# Patient Record
Sex: Female | Born: 1945
Health system: Southern US, Community
[De-identification: ages and names within clinical notes are randomized; demographics above are authoritative.]

## PROBLEM LIST (undated history)

## (undated) DIAGNOSIS — T7840XA Allergy, unspecified, initial encounter: Secondary | ICD-10-CM

## (undated) DIAGNOSIS — M8580 Other specified disorders of bone density and structure, unspecified site: Secondary | ICD-10-CM

## (undated) DIAGNOSIS — H409 Unspecified glaucoma: Secondary | ICD-10-CM

## (undated) DIAGNOSIS — M129 Arthropathy, unspecified: Secondary | ICD-10-CM

## (undated) DIAGNOSIS — J309 Allergic rhinitis, unspecified: Secondary | ICD-10-CM

## (undated) DIAGNOSIS — R7303 Prediabetes: Secondary | ICD-10-CM

## (undated) DIAGNOSIS — M25569 Pain in unspecified knee: Secondary | ICD-10-CM

## (undated) DIAGNOSIS — I10 Essential (primary) hypertension: Secondary | ICD-10-CM

## (undated) DIAGNOSIS — M62838 Other muscle spasm: Secondary | ICD-10-CM

## (undated) DIAGNOSIS — Z86010 Personal history of colonic polyps: Secondary | ICD-10-CM

## (undated) DIAGNOSIS — R739 Hyperglycemia, unspecified: Secondary | ICD-10-CM

## (undated) DIAGNOSIS — H269 Unspecified cataract: Secondary | ICD-10-CM

## (undated) DIAGNOSIS — B029 Zoster without complications: Secondary | ICD-10-CM

## (undated) DIAGNOSIS — E785 Hyperlipidemia, unspecified: Secondary | ICD-10-CM

## (undated) DIAGNOSIS — M1990 Unspecified osteoarthritis, unspecified site: Secondary | ICD-10-CM

## (undated) DIAGNOSIS — R079 Chest pain, unspecified: Secondary | ICD-10-CM

## (undated) DIAGNOSIS — K219 Gastro-esophageal reflux disease without esophagitis: Secondary | ICD-10-CM

## (undated) DIAGNOSIS — M5430 Sciatica, unspecified side: Secondary | ICD-10-CM

## (undated) DIAGNOSIS — R011 Cardiac murmur, unspecified: Secondary | ICD-10-CM

## (undated) DIAGNOSIS — H60399 Other infective otitis externa, unspecified ear: Secondary | ICD-10-CM

## (undated) DIAGNOSIS — R252 Cramp and spasm: Secondary | ICD-10-CM

## (undated) DIAGNOSIS — F419 Anxiety disorder, unspecified: Secondary | ICD-10-CM

## (undated) DIAGNOSIS — R05 Cough: Secondary | ICD-10-CM

## (undated) DIAGNOSIS — K921 Melena: Secondary | ICD-10-CM

## (undated) DIAGNOSIS — I498 Other specified cardiac arrhythmias: Secondary | ICD-10-CM

## (undated) HISTORY — DX: Other muscle spasm: M62.838

## (undated) HISTORY — DX: Prediabetes: R73.03

## (undated) HISTORY — DX: Cardiac murmur, unspecified: R01.1

## (undated) HISTORY — DX: Cramp and spasm: R25.2

## (undated) HISTORY — PX: BREAST BIOPSY: SHX20

## (undated) HISTORY — DX: Unspecified glaucoma: H40.9

## (undated) HISTORY — DX: Melena: K92.1

## (undated) HISTORY — DX: Hyperlipidemia, unspecified: E78.5

## (undated) HISTORY — DX: Chest pain, unspecified: R07.9

## (undated) HISTORY — DX: Anxiety disorder, unspecified: F41.9

## (undated) HISTORY — PX: TUBAL LIGATION: SHX77

## (undated) HISTORY — DX: Personal history of colonic polyps: Z86.010

## (undated) HISTORY — DX: Other specified disorders of bone density and structure, unspecified site: M85.80

## (undated) HISTORY — DX: Gastro-esophageal reflux disease without esophagitis: K21.9

## (undated) HISTORY — PX: POLYPECTOMY: SHX149

## (undated) HISTORY — DX: Zoster without complications: B02.9

## (undated) HISTORY — DX: Allergy, unspecified, initial encounter: T78.40XA

## (undated) HISTORY — DX: Arthropathy, unspecified: M12.9

## (undated) HISTORY — PX: COLONOSCOPY: SHX174

## (undated) HISTORY — DX: Unspecified cataract: H26.9

## (undated) HISTORY — DX: Sciatica, unspecified side: M54.30

## (undated) HISTORY — DX: Allergic rhinitis, unspecified: J30.9

## (undated) HISTORY — DX: Other specified cardiac arrhythmias: I49.8

## (undated) HISTORY — PX: UPPER GASTROINTESTINAL ENDOSCOPY: SHX188

## (undated) HISTORY — PX: ESOPHAGOGASTRODUODENOSCOPY: SHX1529

## (undated) HISTORY — DX: Other infective otitis externa, unspecified ear: H60.399

## (undated) HISTORY — DX: Unspecified osteoarthritis, unspecified site: M19.90

## (undated) HISTORY — DX: Hyperglycemia, unspecified: R73.9

## (undated) HISTORY — DX: Essential (primary) hypertension: I10

## (undated) HISTORY — DX: Pain in unspecified knee: M25.569

## (undated) HISTORY — DX: Cough: R05

---

## 2007-08-29 ENCOUNTER — Encounter: Payer: Self-pay | Admitting: Internal Medicine

## 2007-08-30 ENCOUNTER — Encounter: Payer: Self-pay | Admitting: Internal Medicine

## 2009-08-23 ENCOUNTER — Ambulatory Visit: Payer: Self-pay | Admitting: Internal Medicine

## 2009-08-23 DIAGNOSIS — M129 Arthropathy, unspecified: Secondary | ICD-10-CM | POA: Insufficient documentation

## 2009-08-23 DIAGNOSIS — Z860100 Personal history of colon polyps, unspecified: Secondary | ICD-10-CM | POA: Insufficient documentation

## 2009-08-23 DIAGNOSIS — J309 Allergic rhinitis, unspecified: Secondary | ICD-10-CM

## 2009-08-23 DIAGNOSIS — E7800 Pure hypercholesterolemia, unspecified: Secondary | ICD-10-CM | POA: Insufficient documentation

## 2009-08-23 DIAGNOSIS — H409 Unspecified glaucoma: Secondary | ICD-10-CM | POA: Insufficient documentation

## 2009-08-23 DIAGNOSIS — M25569 Pain in unspecified knee: Secondary | ICD-10-CM | POA: Insufficient documentation

## 2009-08-23 DIAGNOSIS — E785 Hyperlipidemia, unspecified: Secondary | ICD-10-CM | POA: Insufficient documentation

## 2009-08-23 DIAGNOSIS — J40 Bronchitis, not specified as acute or chronic: Secondary | ICD-10-CM | POA: Insufficient documentation

## 2009-08-23 DIAGNOSIS — I10 Essential (primary) hypertension: Secondary | ICD-10-CM | POA: Insufficient documentation

## 2009-08-23 HISTORY — DX: Allergic rhinitis, unspecified: J30.9

## 2009-08-23 HISTORY — DX: Pain in unspecified knee: M25.569

## 2009-08-23 HISTORY — DX: Personal history of colon polyps, unspecified: Z86.0100

## 2009-08-23 HISTORY — DX: Personal history of colonic polyps: Z86.010

## 2009-09-23 ENCOUNTER — Telehealth: Payer: Self-pay | Admitting: Internal Medicine

## 2009-11-24 ENCOUNTER — Telehealth: Payer: Self-pay | Admitting: Internal Medicine

## 2009-12-18 ENCOUNTER — Encounter: Payer: Self-pay | Admitting: Internal Medicine

## 2009-12-19 ENCOUNTER — Encounter: Payer: Self-pay | Admitting: Internal Medicine

## 2009-12-19 DIAGNOSIS — R079 Chest pain, unspecified: Secondary | ICD-10-CM

## 2009-12-19 HISTORY — DX: Chest pain, unspecified: R07.9

## 2009-12-19 LAB — CONVERTED CEMR LAB
BUN: 16 mg/dL
Glucose, Bld: 87 mg/dL
Lymphocytes, automated: 24.8 %
MCV: 87 fL
Neutrophils Relative %: 63.6 %
Platelets: 343 10*3/uL
Potassium: 4.1 meq/L
Sodium: 141 meq/L

## 2009-12-20 ENCOUNTER — Ambulatory Visit: Payer: Self-pay | Admitting: Cardiology

## 2009-12-20 ENCOUNTER — Encounter: Payer: Self-pay | Admitting: Internal Medicine

## 2009-12-20 LAB — CONVERTED CEMR LAB
CO2: 31 meq/L
Calcium: 8.9 mg/dL
Glucose, Bld: 94 mg/dL
Potassium: 4.1 meq/L
Sodium: 142 meq/L

## 2009-12-27 ENCOUNTER — Telehealth: Payer: Self-pay | Admitting: Internal Medicine

## 2010-01-19 ENCOUNTER — Encounter: Payer: Self-pay | Admitting: Internal Medicine

## 2010-01-20 ENCOUNTER — Ambulatory Visit: Payer: Self-pay | Admitting: Internal Medicine

## 2010-01-20 DIAGNOSIS — H60399 Other infective otitis externa, unspecified ear: Secondary | ICD-10-CM

## 2010-01-20 DIAGNOSIS — M5430 Sciatica, unspecified side: Secondary | ICD-10-CM

## 2010-01-20 HISTORY — DX: Other infective otitis externa, unspecified ear: H60.399

## 2010-02-07 ENCOUNTER — Encounter: Payer: Self-pay | Admitting: Internal Medicine

## 2010-02-07 LAB — HM DIABETES EYE EXAM: HM Diabetic Eye Exam: NORMAL

## 2010-02-22 ENCOUNTER — Telehealth: Payer: Self-pay | Admitting: Internal Medicine

## 2010-04-12 NOTE — Letter (Signed)
Summary: [facility]  [facility]   Imported By: Lester [place] 01/24/2010 10:45:16  _____________________________________________________________________  External Attachment:    Type:   Image     Comment:   External Document

## 2010-04-12 NOTE — Progress Notes (Signed)
Summary: caduet  Phone Note Refill Request Message from:  Pharmacy on December 27, 2009 11:48 AM  Refills Requested: Medication #1:  CADUET 10-40 MG TABS take 1 by mouth once daily   Last Refilled: 12/23/2009 [facility] [place]  Initial call taken by: Orlan Leavens RMA,  December 27, 2009 11:48 AM    Prescriptions: CADUET 10-40 MG TABS (AMLODIPINE-ATORVASTATIN) take 1 by mouth once daily  #30 x 6   Entered by:   Orlan Leavens RMA   Authorized by:   Newt Lukes MD   Signed by:   Orlan Leavens RMA on 12/27/2009   Method used:   Electronically to        [facility]* (retail)       [address]       [place], [place]  [id number]       Ph: [phone]       Fax: [phone]   RxID:   [id number]

## 2010-04-12 NOTE — Progress Notes (Signed)
Summary: avalide  Phone Note Refill Request Message from:  Fax from Pharmacy on November 24, 2009 3:12 PM  Refills Requested: Medication #1:  AVALIDE 150-12.5 MG TABS take 1 by mouth once daily   Last Refilled: 11/23/2009  Method Requested: Electronic Initial call taken by: Orlan Leavens RMA,  November 24, 2009 3:12 PM    Prescriptions: AVALIDE 150-12.5 MG TABS (IRBESARTAN-HYDROCHLOROTHIAZIDE) take 1 by mouth once daily  #30 x 5   Entered by:   Orlan Leavens RMA   Authorized by:   Newt Lukes MD   Signed by:   Orlan Leavens RMA on 11/24/2009   Method used:   Electronically to        [facility]* (retail)       [address]       [place], [place]  [id number]       Ph: [phone]       Fax: [phone]   RxID:   [id number]

## 2010-04-12 NOTE — Letter (Signed)
Summary: Discharge Summary/ [facility]  Discharge Summary/ [facility]   Imported By: Dorise Hiss 12/31/2009 08:59:06  _____________________________________________________________________  External Attachment:    Type:   Image     Comment:   External Document

## 2010-04-12 NOTE — Letter (Signed)
[facility]   Imported By: Lester [place] 02/15/2010 09:11:58  _____________________________________________________________________  External Attachment:    Type:   Image     Comment:   External Document

## 2010-04-12 NOTE — Assessment & Plan Note (Signed)
Summary: NEW [facility] PT--PKG--#--STC   Vital Signs:  Patient profile:   65 year old female Height:      57 inches (144.78 cm) Weight:      213.12 pounds (96.87 kg) BMI:     46.29 O2 Sat:      97 % on Room air Temp:     99.0 degrees F (37.22 degrees C) oral Pulse rate:   90 / minute BP sitting:   138 / 94  (left arm) Cuff size:   large  Vitals Entered By: Orlan Leavens (August 23, 2009 8:36 AM)  O2 Flow:  Room air CC: New patient/ complaining of dry cough Is Patient Diabetic? No Pain Assessment Patient in pain? no        Primary Care Provider:  Newt Lukes MD  CC:  New patient/ complaining of dry cough.  History of Present Illness: new pt to me, here to est care - prev followed with providers in [place] -  1) HTN = reports compliance with ongoing medical treatment and no changes in medication dose or frequency. denies adverse side effects related to current therapy.   2) dyslipidemia - reports compliance with ongoing medical treatment and no changes in medication dose or frequency. denies adverse side effects related to current therapy.   3) glaucoma - dx 2010 - reports compliance with ongoing medical treatment and no changes in medication dose or frequency. denies adverse side effects related to current therapy.  no change in vision  4) left knee pain since spring 2010 - onset following twist injury - improved with alleve - no swelling - occ pain with ambulation and stairs  5) chronic allergic rhinitis and sinusitus - got rx for abx from prior MD 3 days ago but not helping symptoms - yellow mucus x 5 days with cough - sputum from nares and chest, no CP or HA or vision change - robitussin not helping - prev on singular but ?gneric or OTC med to use  Preventive Screening-Counseling & Management  Alcohol-Tobacco     Alcohol drinks/day: 0     Alcohol Counseling: not indicated; patient does not drink     Smoking Status: never     Tobacco Counseling: not indicated; no  tobacco use  Caffeine-Diet-Exercise     Caffeine Counseling: not indicated; caffeine use is not excessive or problematic     Does Patient Exercise: yes     Exercise Counseling: to improve exercise regimen     Depression Counseling: not indicated; screening negative for depression  Safety-Violence-Falls     Seat Belt Use: yes     Seat Belt Counseling: not indicated; patient wears seat belts     Helmet Counseling: not indicated; patient wears helmet when riding bicycle/motocycle     Firearms in the Home: firearms in the home     Firearm Counseling: not indicated; uses recommended firearm safety measures     Smoke Detector Counseling: n/a     Violence Counseling: not indicated; no violence risk noted     Fall Risk Counseling: not indicated; no significant falls noted  Current Medications (verified): 1)  Ambien 5 Mg Tabs (Zolpidem Tartrate) .... Take 1 At Bedtime As Needed 2)  Alprazolam 0.5 Mg Tabs (Alprazolam) .... Take 1-2 By Mouth Once Daily As Needed 3)  Caduet 10-40 Mg Tabs (Amlodipine-Atorvastatin) .... Take 1 By Mouth Once Daily 4)  Avalide 150-12.5 Mg Tabs (Irbesartan-Hydrochlorothiazide) .... Take 1 By Mouth Once Daily 5)  Valacyclovir Hcl 500 Mg Tabs (Valacyclovir Hcl) .Marland KitchenMarland KitchenMarland Kitchen  Take 1 By Mouth Once Daily 6)  Xalatan 0.005 % Soln (Latanoprost) .... Use 1 Drop in Each Eye At Bedtime 7)  Amoxicillin 875 Mg Tabs (Amoxicillin) .... Take 1 By Mouth Once Daily  Allergies (verified): No Known Drug Allergies  Past History:  Past Medical History: Allergic rhinitis Hyperlipidemia Hypertension  Past Surgical History: Tubal ligation  Family History: Family History of Alcoholism/Addiction (brother) Family History of Arthritis (parent) Family History of Colon CA 1st degree relative <60 (parent) Family History Lung cancer (brother & neice) Family History of Prostate CA 1st degree relative <50 (brother) Family History Diabetes 1st degree relative (brothet & mother) Family History  Hypertension (brother)  Social History: Never smoked, no alcohol divorced, lives alone -  retired in [place] for 29yrs but moved "home" to [place] in 2008 Smoking Status:  never Does Patient Exercise:  yes Seat Belt Use:  yes  Review of Systems       see HPI above. I have reviewed all other systems and they were negative.   Physical Exam  General:  overweight-appearing.  alert, well-developed, well-nourished, and cooperative to examination.   +dry cough with conversational effort Eyes:  vision grossly intact; pupils equal, round and reactive to light.  conjunctiva and lids normal.    Ears:  normal pinnae bilaterally, without erythema, swelling, or tenderness to palpation. TMs clear, without effusion, or cerumen impaction. Hearing grossly normal bilaterally  Mouth:  teeth and gums in good repair; mucous membranes moist, without lesions or ulcers. oropharynx clear without exudate, min erythema. +yellow PND Lungs:  normal respiratory effort, no intercostal retractions or use of accessory muscles; normal breath sounds bilaterally - no crackles and no wheezes.    Heart:  normal rate, regular rhythm, no murmur, and no rub. BLE with trace ankleedema. normal DP pulses and normal cap refill in all 4 extremities    Abdomen:  soft, non-tender, normal bowel sounds, no distention; no masses and no appreciable hepatomegaly or splenomegaly.   Genitalia:  defer to gyn Msk:  left knee: full range of motion, no joint effusion or swelling. no erythema or abnormal warmth. Stable to ligamentous testing. Nontender to palpation. Neurovascularly intact.  Neurologic:  alert & oriented X3 and cranial nerves II-XII symetrically intact.  strength normal in all extremities, sensation intact to light touch, and gait normal. speech fluent without dysarthria or aphasia; follows commands with good comprehension.  Skin:  no rashes, vesicles, ulcers, or erythema. No nodules or irregularity to palpation.  Psych:  Oriented X3,  memory intact for recent and remote, normally interactive, good eye contact, not anxious appearing, not depressed appearing, and not agitated.      Impression & Recommendations:  Problem # 1:  BRONCHITIS (ICD-490)  Her updated medication list for this problem includes:    Tessalon 200 Mg Caps (Benzonatate) .Marland Kitchen... 1 by mouth three times a day x 5 days, then every 8 hours as needed for cough    Azithromycin 250 Mg Tabs (Azithromycin) .Marland Kitchen... 2 tabs by mouth today, then 1 by mouth daily starting tomorrow  Take antibiotics and other medications as directed. Encouraged to push clear liquids, get enough rest, and take acetaminophen as needed. To be seen in 5-7 days if no improvement, sooner if worse.  Orders: Prescription Created Electronically [phone])  Problem # 2:  HYPERLIPIDEMIA (ICD-272.4) send for prior records to review control - plan check labs next OV if none here or if due for labs- no med change for now Her updated medication list for this  problem includes:    Caduet 10-40 Mg Tabs (Amlodipine-atorvastatin) .Marland Kitchen... Take 1 by mouth once daily  Problem # 3:  HYPERTENSION (ICD-401.9) again, send for priorlabs Her updated medication list for this problem includes:    Caduet 10-40 Mg Tabs (Amlodipine-atorvastatin) .Marland Kitchen... Take 1 by mouth once daily    Avalide 150-12.5 Mg Tabs (Irbesartan-hydrochlorothiazide) .Marland Kitchen... Take 1 by mouth once daily  BP today: 138/94  Problem # 4:  ALLERGIC RHINITIS (ICD-477.9)  Her updated medication list for this problem includes:    Loratadine 10 Mg Tabs (Loratadine) .Marland Kitchen... 1 by mouth once daily  Discussed use of allergy medications and environmental measures.   Problem # 5:  KNEE PAIN (ICD-719.46)  twisitng injury months ago - no swelling and exam benign cont alleve or tylenol and further eval if worse Her updated medication list for this problem includes:    Aspirin 81 Mg Tabs (Aspirin) .Marland Kitchen... 1 by mouth once daily  Discussed strengthening exercises, use  of ice or heat, and medications.   Problem # 6:  GLUCOMA (ICD-365.9)  Complete Medication List: 1)  Ambien 5 Mg Tabs (Zolpidem tartrate) .... Take 1 at bedtime as needed 2)  Alprazolam 0.5 Mg Tabs (Alprazolam) .... Take 1-2 by mouth once daily as needed 3)  Caduet 10-40 Mg Tabs (Amlodipine-atorvastatin) .... Take 1 by mouth once daily 4)  Avalide 150-12.5 Mg Tabs (Irbesartan-hydrochlorothiazide) .... Take 1 by mouth once daily 5)  Valacyclovir Hcl 500 Mg Tabs (Valacyclovir hcl) .... Take 1 by mouth once daily 6)  Xalatan 0.005 % Soln (Latanoprost) .... Use 1 drop in each eye at bedtime 7)  Aspirin 81 Mg Tabs (Aspirin) .Marland Kitchen.. 1 by mouth once daily 8)  Vitamin D3 1000 Unit Tabs (Cholecalciferol) .Marland Kitchen.. 1 by mouth once daily 9)  Vitamin B-12 1000 Mcg Tabs (Cyanocobalamin) .Marland Kitchen.. 1 by mouth once daily 10)  Multivitamins Tabs (Multiple vitamin) .Marland Kitchen.. 1 by mouth once daily 11)  Loratadine 10 Mg Tabs (Loratadine) .Marland Kitchen.. 1 by mouth once daily 12)  Tessalon 200 Mg Caps (Benzonatate) .Marland Kitchen.. 1 by mouth three times a day x 5 days, then every 8 hours as needed for cough 13)  Azithromycin 250 Mg Tabs (Azithromycin) .... 2 tabs by mouth today, then 1 by mouth daily starting tomorrow  Patient Instructions: 1)  it was good to see you today. 2)  will send for copy of medical records from dr. Wayne Sever as discussed to review 3)  chane antibiotic to Azithromycin (stop amoxicillin) and use tessalon for cough - your prescriptions have been electronically submitted to your pharmacy. Please take as directed. Contact our office if you believe you're having problems with the medication(s).  4)  use generic claritin (loratadine) as discussed for allergy and siunus symptoms - call if not working and we will send prescription for singular as discussed 5)  continue all other medications as ongoing - no changes 6)  Please schedule a follow-up appointment in 4-6 months, sooner if problems.  Prescriptions: ALPRAZOLAM 0.5 MG TABS  (ALPRAZOLAM) take 1-2 by mouth once daily as needed  #60 x 5   Entered and Authorized by:   Newt Lukes MD   Signed by:   Newt Lukes MD on 08/23/2009   Method used:   Print then Give to Patient   RxID:   [id number] TESSALON 200 MG CAPS (BENZONATATE) 1 by mouth three times a day x 5 days, then every 8 hours as needed for cough  #30 x 0   Entered and Authorized  by:   Newt Lukes MD   Signed by:   Newt Lukes MD on 08/23/2009   Method used:   Electronically to        [facility] (retail)       [address]       [place], [place]  [id number]       Ph: [phone]       Fax: [phone]   RxID:   [id number] AZITHROMYCIN 250 MG TABS (AZITHROMYCIN) 2 tabs by mouth today, then 1 by mouth daily starting tomorrow  #6 x 0   Entered and Authorized by:   Newt Lukes MD   Signed by:   Newt Lukes MD on 08/23/2009   Method used:   Electronically to        [facility] (retail)       [address]       [place], [place]  [id number]       Ph: [phone]       Fax: [phone]   RxID:   [id number]

## 2010-04-12 NOTE — Letter (Signed)
Summary: [facility]  [facility]   Imported By: Lester [place] 01/25/2010 09:51:11  _____________________________________________________________________  External Attachment:    Type:   Image     Comment:   External Document

## 2010-04-12 NOTE — Progress Notes (Signed)
Summary: referral  Phone Note Call from Patient   Caller: Patient [phone] Summary of Call: Pt is requesting referral to [facility] Initial call taken by: Margaret Pyle, CMA,  September 23, 2009 9:35 AM  Follow-up for Phone Call        ok - done Follow-up by: Newt Lukes MD,  September 23, 2009 10:13 AM  Additional Follow-up for Phone Call Additional follow up Details #1::        Pt advised via VM Additional Follow-up by: Margaret Pyle, CMA,  September 23, 2009 10:29 AM  New Problems: SPECIAL SCREENING FOR MALIGNANT NEOPLASMS VAGINA (ICD-V76.47)   New Problems: SPECIAL SCREENING FOR MALIGNANT NEOPLASMS VAGINA (ICD-V76.47)

## 2010-04-12 NOTE — Assessment & Plan Note (Signed)
Summary: FU---STC--PER PT RS TO POST HOSP--STC   Vital Signs:  Patient profile:   65 year old female Height:      57 inches (144.78 cm) Weight:      215.12 pounds (97.78 kg) O2 Sat:      97 % on Room air Temp:     98.6 degrees F (37.00 degrees C) oral Pulse rate:   93 / minute BP sitting:   100 / 72  (left arm) Cuff size:   large  Vitals Entered By: Orlan Leavens RMA (January 20, 2010 10:56 AM)  O2 Flow:  Room air CC: Hosp f/u was in [facility] Is Patient Diabetic? No Pain Assessment Patient in pain? no      Comments Pt also want refill on Ambien   Primary Care Provider:  Newt Lukes MD  CC:  Hosp f/u was in [facility].  History of Present Illness: here for hosp f/u: at [facility] 12/19/09-12/20/09 for CP - neg eval for cardiac issues including neg tele, neg CL - also cont L low back and thigh pain from prior injury (see below) - not addressed during hosp- feels tired and weak since hosp - no energy but denies depression  reviewed chronic med issues 1) HTN = reports compliance with ongoing medical treatment and no changes in medication dose or frequency. denies adverse side effects related to current therapy.   2) dyslipidemia - reports compliance with ongoing medical treatment and no changes in medication dose or frequency. denies adverse side effects related to current therapy.   3) glaucoma - dx 2010 - reports compliance with ongoing medical treatment and no changes in medication dose or frequency. denies adverse side effects related to current therapy.  no change in vision  4) left knee pain since spring 2010 - onset following twist injury - improved with alleve - no swelling - occ pain with ambulation and stairs - complicated by left thigh pain from back DDD  5) chronic allergic rhinitis and sinusitus -chronic sputum from nares and chest, no CP or HA or vision change - robitussin not helping, take loritadine regularly - prev on singular  Clinical  Review Panels:  Complete Metabolic Panel   Glucose:  87 (12/19/2009)   Sodium:  141 (12/19/2009)   Potassium:  4.1 (12/19/2009)   BUN:  16 (12/19/2009)   Creatinine:  0.86 (12/19/2009)   Current Medications (verified): 1)  Alprazolam 0.5 Mg Tabs (Alprazolam) .... Take 1-2 By Mouth Once Daily As Needed 2)  Caduet 10-40 Mg Tabs (Amlodipine-Atorvastatin) .... Take 1 By Mouth Once Daily 3)  Avalide 150-12.5 Mg Tabs (Irbesartan-Hydrochlorothiazide) .... Take 1 By Mouth Once Daily 4)  Valacyclovir Hcl 500 Mg Tabs (Valacyclovir Hcl) .... Take 1 By Mouth Once Daily 5)  Xalatan 0.005 % Soln (Latanoprost) .... Use 1 Drop in Each Eye At Bedtime 6)  Aspirin 81 Mg Tabs (Aspirin) .Marland Kitchen.. 1 By Mouth Once Daily 7)  Vitamin D3 1000 Unit Tabs (Cholecalciferol) .Marland Kitchen.. 1 By Mouth Once Daily 8)  Vitamin B-12 1000 Mcg Tabs (Cyanocobalamin) .Marland Kitchen.. 1 By Mouth Once Daily 9)  Multivitamins  Tabs (Multiple Vitamin) .Marland Kitchen.. 1 By Mouth Once Daily 10)  Loratadine 10 Mg Tabs (Loratadine) .Marland Kitchen.. 1 By Mouth Once Daily 11)  Tessalon 200 Mg Caps (Benzonatate) .Marland Kitchen.. 1 By Mouth Three Times A Day X 5 Days, Then Every 8 Hours As Needed For Cough 12)  Ambien Cr 12.5 Mg Cr-Tabs (Zolpidem Tartrate) .... Take 1 At Bedtime As Needed  Allergies (verified):  No Known Drug Allergies  Past History:  Past Medical History: Allergic rhinitis Hyperlipidemia Hypertension  Review of Systems  The patient denies fever, decreased hearing, chest pain, syncope, and headaches.         c/o itch and uncomfortable right ear x 6 days, no drainage   Physical Exam  General:  overweight-appearing.  alert, well-developed, well-nourished, and cooperative to examination.   +dry cough with conversational effort Ears:  mild R otitis external and mod red TM, L ear and TM normal Mouth:  teeth and gums in good repair; mucous membranes moist, without lesions or ulcers. oropharynx clear without exudate, min erythema. +yellow PND Lungs:  normal respiratory  effort, no intercostal retractions or use of accessory muscles; normal breath sounds bilaterally - no crackles and no wheezes.    Heart:  normal rate, regular rhythm, no murmur, and no rub. BLE with trace ankle edema.  Msk:  left knee: full range of motion, no joint effusion or swelling. no erythema or abnormal warmth. Stable to ligamentous testing. Nontender to palpation. Neurovascularly intact. back: full range of motion of lumbar spine. Nontender to palpation. Deep tendon reflexes symmetrically intact. Sensation intact throughout all dermatomes in bilateral lower extremities. Full strength to manual muscle testing in all major groups. Able to heel and toe walk without difficulty and ambulates with a normal gait.    Impression & Recommendations:  Problem # 1:  OTITIS EXTERNA, RIGHT (ICD-380.10)  Her updated medication list for this problem includes:    Cipro Hc 0.2-1 % Susp (Ciprofloxacin-hydrocortisone) .Marland KitchenMarland KitchenMarland KitchenMarland Kitchen 3 drops to affected ear two times a day x 7days  Discussed symptomatic treatment and preventive measures.   Problem # 2:  CHEST PAIN (ICD-786.50) hosp dc summary reviewed - no cardiac issue identifies -  send for copy of labs and tests results to have on file - no recurrence since dc but feels weak - see next  Problem # 3:  HYPERTENSION (ICD-401.9)  ?overtx - stop avalide and cont amlodipine component of caudet Her updated medication list for this problem includes:    Caduet 10-40 Mg Tabs (Amlodipine-atorvastatin) .Marland Kitchen... Take 1 by mouth once daily    Avalide 150-12.5 Mg Tabs (Irbesartan-hydrochlorothiazide) ..... Hold for now  BP today: 100/72 Prior BP: 138/94 (08/23/2009)  Labs Reviewed: K+: 4.1 (12/19/2009) Creat: : 0.86 (12/19/2009)     Problem # 4:  SCIATICA, LEFT (ICD-724.3)  chronic - refer to ortho spine for eval and tx Her updated medication list for this problem includes:    Aspirin 81 Mg Tabs (Aspirin) .Marland Kitchen... 1 by mouth once daily  Orders: Orthopedic Surgeon  Referral (Ortho Surgeon)  Problem # 5:  HYPERLIPIDEMIA (ICD-272.4)  Her updated medication list for this problem includes:    Caduet 10-40 Mg Tabs (Amlodipine-atorvastatin) .Marland Kitchen... Take 1 by mouth once daily  send for hosp ecords to review control -no med change for now  Complete Medication List: 1)  Alprazolam 0.5 Mg Tabs (Alprazolam) .... Take 1-2 by mouth once daily as needed 2)  Caduet 10-40 Mg Tabs (Amlodipine-atorvastatin) .... Take 1 by mouth once daily 3)  Avalide 150-12.5 Mg Tabs (Irbesartan-hydrochlorothiazide) .... Hold for now 4)  Valacyclovir Hcl 500 Mg Tabs (Valacyclovir hcl) .... Take 1 by mouth once daily 5)  Xalatan 0.005 % Soln (Latanoprost) .... Use 1 drop in each eye at bedtime 6)  Aspirin 81 Mg Tabs (Aspirin) .Marland Kitchen.. 1 by mouth once daily 7)  Vitamin D3 1000 Unit Tabs (Cholecalciferol) .Marland Kitchen.. 1 by mouth once daily 8)  Vitamin  B-12 1000 Mcg Tabs (Cyanocobalamin) .Marland Kitchen.. 1 by mouth once daily 9)  Multivitamins Tabs (Multiple vitamin) .Marland Kitchen.. 1 by mouth once daily 10)  Loratadine 10 Mg Tabs (Loratadine) .Marland Kitchen.. 1 by mouth once daily 11)  Tessalon 200 Mg Caps (Benzonatate) .Marland Kitchen.. 1 by mouth three times a day x 5 days, then every 8 hours as needed for cough 12)  Ambien Cr 12.5 Mg Cr-tabs (Zolpidem tartrate) .... Take 1 at bedtime as needed 13)  Cipro Hc 0.2-1 % Susp (Ciprofloxacin-hydrocortisone) .... 3 drops to affected ear two times a day x 7days  Other Orders: Admin 1st Vaccine (32202) Flu Vaccine 51yrs + (54270)  Patient Instructions: 1)  it was good to see you today. 2)  recent hospital stay reviewed today - will send for copy of labs and tests done at that time 3)  stop avalide until further notice 4)  we'll make referral to spine specialist for your back and left thigh pain. Our office will contact you regarding this appointment once made.  5)  ear drops for right eat symptoms - also refill on ambien  6)  your prescriptions have been given to you to submit to your pharmacy.  Please take as directed. Contact our office if you believe you're having problems with the medication(s).  7)  Please schedule a follow-up appointment in 3 months to recheck bloos pressure, call sooner if problems.  Prescriptions: AMBIEN CR 12.5 MG CR-TABS (ZOLPIDEM TARTRATE) take 1 at bedtime as needed  #30 x 1   Entered and Authorized by:   Newt Lukes MD   Signed by:   Newt Lukes MD on 01/20/2010   Method used:   Print then Give to Patient   RxID:   [id number] CIPRO HC 0.2-1 % SUSP (CIPROFLOXACIN-HYDROCORTISONE) 3 drops to affected ear two times a day x 7days  #1 x 0   Entered and Authorized by:   Newt Lukes MD   Signed by:   Newt Lukes MD on 01/20/2010   Method used:   Print then Give to Patient   RxID:   [id number]    Orders Added: 1)  Admin 1st Vaccine [90471] 2)  Flu Vaccine 30yrs + [03500] 3)  Orthopedic Surgeon Referral [Ortho Surgeon] 4)  Est. Patient Level IV [93818] Flu Vaccine Consent Questions     Do you have a history of severe allergic reactions to this vaccine? no    Any prior history of allergic reactions to egg and/or gelatin? no    Do you have a sensitivity to the preservative Thimersol? no    Do you have a past history of Guillan-Barre Syndrome? no    Do you currently have an acute febrile illness? no    Have you ever had a severe reaction to latex? no    Vaccine information given and explained to patient? yes    Are you currently pregnant? no    Lot Number:[id number]   Exp Date:09/10/2010   Site Given  Left Deltoid IMVaccine 43yrs + [id number]     .lbflu

## 2010-04-14 NOTE — Progress Notes (Signed)
Summary: alprazolam  Phone Note Refill Request Message from:  Fax from Pharmacy on February 22, 2010 4:28 PM  Refills Requested: Medication #1:  ALPRAZOLAM 0.5 MG TABS take 1-2 by mouth once daily as needed # 60   Last Refilled: 12/09/2009 [facility] [phone] Last ov 01/20/10 Is this ok to refill?  Next Appointment Scheduled: none Initial call taken by: Orlan Leavens RMA,  February 22, 2010 4:30 PM  Follow-up for Phone Call        ok to fill as prev rx'd Follow-up by: Newt Lukes MD,  February 22, 2010 4:41 PM  Additional Follow-up for Phone Call Additional follow up Details #1::        F[facility]ed paper [facility] [place] [email or web address]. Updated EMR Additional Follow-up by: Orlan Leavens RMA,  February 22, 2010 4:55 PM    Prescriptions: ALPRAZOLAM 0.5 MG TABS (ALPRAZOLAM) take 1-2 by mouth once daily as needed  #60 x 5   Entered by:   Orlan Leavens RMA   Authorized by:   Newt Lukes MD   Signed by:   Orlan Leavens RMA on 02/22/2010   Method used:   Historical   RxID:   [id number]

## 2010-04-14 NOTE — Consult Note (Signed)
Summary: [facility]   Imported By: Lester Lockbourne 03/17/2010 10:34:42  _____________________________________________________________________  External Attachment:    Type:   Image     Comment:   External Document

## 2010-08-01 ENCOUNTER — Encounter: Payer: Self-pay | Admitting: Internal Medicine

## 2010-08-12 ENCOUNTER — Other Ambulatory Visit: Payer: Self-pay | Admitting: *Deleted

## 2010-08-16 ENCOUNTER — Other Ambulatory Visit: Payer: Self-pay | Admitting: *Deleted

## 2010-08-16 MED ORDER — IRBESARTAN-HYDROCHLOROTHIAZIDE 150-12.5 MG PO TABS: 1.0000 | ORAL_TABLET | Freq: Every day | ORAL | Status: DC

## 2010-09-06 ENCOUNTER — Other Ambulatory Visit: Payer: Self-pay | Admitting: Internal Medicine

## 2010-09-07 NOTE — Telephone Encounter (Signed)
Faxed script back to [facility] in [place] @ [phone].Marland KitchenMarland Kitchen6/27/12@1 :34pm/LMB

## 2010-10-03 ENCOUNTER — Other Ambulatory Visit: Payer: Self-pay

## 2010-10-03 MED ORDER — IRBESARTAN-HYDROCHLOROTHIAZIDE 150-12.5 MG PO TABS: 1.0000 | ORAL_TABLET | Freq: Every day | ORAL | Status: DC

## 2010-10-03 MED ORDER — VALACYCLOVIR HCL 500 MG PO TABS: 500.0000 mg | ORAL_TABLET | Freq: Every day | ORAL | Status: DC

## 2010-10-03 MED ORDER — LORATADINE 10 MG PO TABS: 10.0000 mg | ORAL_TABLET | Freq: Every day | ORAL | Status: DC

## 2010-10-03 MED ORDER — ZOLPIDEM TARTRATE ER 12.5 MG PO TBCR: 12.5000 mg | EXTENDED_RELEASE_TABLET | Freq: Every evening | ORAL | Status: DC | PRN

## 2010-10-03 MED ORDER — AMLODIPINE-ATORVASTATIN 10-40 MG PO TABS: 1.0000 | ORAL_TABLET | Freq: Every day | ORAL | Status: DC

## 2010-10-03 MED ORDER — VITAMIN B-12 1000 MCG PO TABS: 1000.0000 ug | ORAL_TABLET | Freq: Every day | ORAL | Status: DC

## 2010-10-03 NOTE — Telephone Encounter (Signed)
Yes done thanks

## 2010-10-03 NOTE — Telephone Encounter (Signed)
Pt called requesting Rxs for all of her medications because she is changing pharmacies. Pt is [place] and Ambien specifically because she is going on vacation. Rxs refilled and pharmacy updated. Okay to refill Ambien?

## 2010-10-04 NOTE — Telephone Encounter (Signed)
Rx faxed to pharmacy  

## 2010-11-12 DIAGNOSIS — M5430 Sciatica, unspecified side: Secondary | ICD-10-CM

## 2010-11-12 HISTORY — DX: Sciatica, unspecified side: M54.30

## 2010-11-17 ENCOUNTER — Ambulatory Visit (INDEPENDENT_AMBULATORY_CARE_PROVIDER_SITE_OTHER)
Admission: RE | Admit: 2010-11-17 | Discharge: 2010-11-17 | Disposition: A | Discharge status: Home / Self Care | Payer: Medicare Other | Source: Ambulatory Visit | Attending: Internal Medicine | Admitting: Internal Medicine

## 2010-11-17 ENCOUNTER — Encounter: Payer: Self-pay | Admitting: Internal Medicine

## 2010-11-17 ENCOUNTER — Ambulatory Visit (INDEPENDENT_AMBULATORY_CARE_PROVIDER_SITE_OTHER): Payer: Medicare Other | Admitting: Internal Medicine

## 2010-11-17 VITALS — BP 132/82 | HR 81 | Temp 98.8°F | Ht 60.0 in | Wt 214.4 lb

## 2010-11-17 DIAGNOSIS — G5790 Unspecified mononeuropathy of unspecified lower limb: Secondary | ICD-10-CM

## 2010-11-17 DIAGNOSIS — M792 Neuralgia and neuritis, unspecified: Secondary | ICD-10-CM

## 2010-11-17 DIAGNOSIS — M549 Dorsalgia, unspecified: Secondary | ICD-10-CM

## 2010-11-17 MED ORDER — TRAMADOL HCL 50 MG PO TABS: 50.0000 mg | ORAL_TABLET | Freq: Four times a day (QID) | ORAL | Status: AC | PRN

## 2010-11-17 NOTE — Progress Notes (Signed)
  Subjective:    Patient ID: Shelley Hayes, female    DOB: 1945/12/04, 65 y.o.   MRN: [id number]  HPI complains of B thigh pain Ongoing for years but now daily pain in last 2 months Progressive worse symptoms  Pain improved with rest - worse with activity such as walking/standing Chronic low back without change - denies falls but pain and "weakness" while walking causes pt to "go to the ground" x 3 events while shopping in last 2 weeks  Also reviewed chronic medical issues  hypertension -reports compliance with ongoing medical treatment and no changes in medication dose or frequency. denies adverse side effects related to current therapy.   dyslipidemia - reports compliance with ongoing medical treatment and no changes in medication dose or frequency. denies adverse side effects related to current therapy.   glaucoma - dx 2010 - reports compliance with ongoing medical treatment and no changes in medication dose or frequency. denies adverse side effects related to current therapy. no change in vision   left knee pain since spring 2010 - onset following twist injury - improved with alleve - no swelling - occ pain with ambulation and stairs - complicated by left thigh pain from back DDD   chronic allergic rhinitis and sinusitus -chronic sputum from nares and chest, no CP or HA or vision change - takes loritadine regularly - prev on singular   Past Medical History  Diagnosis Date  . ARTHRITIS   . ALLERGIC RHINITIS   . GLUCOMA   . HYPERLIPIDEMIA   . HYPERTENSION   . SCIATICA, LEFT     Review of Systems  Constitutional: Negative for fever and unexpected weight change.  Respiratory: Negative for shortness of breath.   Cardiovascular: Negative for chest pain.  Genitourinary: Negative for difficulty urinating.  Neurological: Negative for weakness.       Objective:   Physical Exam BP 132/82  Pulse 81  Temp(Src) 98.8 F (37.1 C) (Oral)  Ht 5' (1.524 m)  Wt 214 lb 6.4 oz (97.251 kg)   BMI 41.87 kg/m2  SpO2 97% Wt Readings from Last 3 Encounters:  11/17/10 214 lb 6.4 oz (97.251 kg)  01/20/10 215 lb 1.9 oz (97.577 kg)  08/23/09 213 lb 1.9 oz (96.67 kg)   Constitutional: She is overweight. She appears well-developed and well-nourished. No distress Cardiovascular: Normal rate, regular rhythm and normal heart sounds.  No murmur heard. No BLE edema. Pulmonary/Chest: Effort normal and breath sounds normal. No respiratory distress. She has no wheezes.  Musculoskeletal: Back: full range of motion of thoracic and lumbar spine. Non tender to palpation. Positive straight leg raise. DTR's are symmetrically intact. Sensation intact in all dermatomes of the lower extremities. Full strength to manual muscle testing- patient is able to heel toe walk without difficulty and ambulates with antalgic gait. Skin: Skin is warm and dry. No rash noted. No erythema.  Psychiatric: She has a normal mood and affect. Her behavior is normal. Judgment and thought content normal.       Assessment & Plan:  Back pain with B thigh pain - suspect degenerative spinal stenosis - check Xray to look for spondylosises and sched MRI L spine - plan refer to Nsurg if surgical findings presenet - tramadol and temporary hand tag rx'd

## 2010-11-17 NOTE — Patient Instructions (Addendum)
It was good to see you today. Test(s) ordered today. Your results will be called to you after review (48-72hours after test completion). If any changes need to be made, you will be notified at that time. we'll make referral for MRI of back. Our office will contact you regarding appointment(s) once made. Use tramadol as needed for pain in addition to your OTC medications - Your prescription(s) have been submitted to your pharmacy. Please take as directed and contact our office if you believe you are having problem(s) with the medication(s). temporary handicap application signed for you todaySpinal Stenosis One cause of back pain is spinal stenosis. Stenosis means abnormal narrowing. The spinal canal contains and protects the spinal nerve roots. In spinal stenosis, the spinal canal narrows and pinches the spinal cord and nerves. This causes low back pain and pain in the legs. Stenosis may pinch the nerves that control muscles and sensation in the legs. This leads to pain and abnormal feelings in the leg muscles and areas supplied by those nerves. CAUSES Spinal stenosis often happens to people as they get older and arthritic boney growths occur in their spinal canal. There is also a loss of the disk height between the bones of the back, which also adds to this problem. Sometimes the problem is present at birth. SYMPTOMS  Pain that is generally worse with activities, particularly standing and walking.   Numbness, tingling, hot or cold feelings, weakness, or a weariness in the legs.   Clumsiness, frequent falling, and a foot-slapping gait, which may come as a result of nerve pressure and muscle weakness.  DIAGNOSIS  Your caregiver may suspect spinal stenosis if you have unusual leg symptoms, such as those previously mentioned.   Your orthopedic surgeon may request special imaging exams, such a computerized magnetic scan (MRI) or computerized X-ray scan (CT) to find out the cause of the problem.    TREATMENT  Sometimes treatments such as postural changes or nonsteroidal anti-inflammatory drugs will relieve the pain.   Nonsteroidal anti-inflammatory medications may help relieve symptoms. These medicines do this by decreasing swelling and inflammation in the nerves.   When stenosis causes severe nerve root compression, conservative treatment may not be enough to maintain a normal life style. Surgery may be recommended to relieve the pressure on affected nerves. In properly selected patients, the results are very good, and patients are able to continue a normal lifestyle.  HOME CARE INSTRUCTIONS  Flexing the spine by leaning forward while walking may relieve symptoms. Lying with the knees drawn up to the chest may offer some relief. These positions enlarge the space available to the nerves. They may make it easier for stenosis sufferers to walk longer distances.   Rest, followed by a gradual resumption of activity, also can help.   Aerobic activity, such as bicycling or swimming, is often recommended.   Losing weight can also relieve some of the load on the spine.   Application of warm or cold compresses to the area of pain can be helpful.  SEEK MEDICAL CARE IF:    The periods of relief between episodes of pain become shorter and shorter.   You experience pain that radiates down your leg, even when you are not standing or walking.  SEEK IMMEDIATE MEDICAL CARE IF:  You have a loss of bowel or bladder control.   You have a sudden loss of feeling in your legs.   You suddenly cannot move your legs.  Document Released: 05/20/2003 Document Re-Released: 08/17/2009 [employer] Patient  Information 2011 [place], [place].

## 2010-11-23 ENCOUNTER — Ambulatory Visit
Admission: RE | Admit: 2010-11-23 | Discharge: 2010-11-23 | Disposition: A | Discharge status: Home / Self Care | Payer: Medicare HMO | Source: Ambulatory Visit | Attending: Internal Medicine | Admitting: Internal Medicine

## 2010-11-23 ENCOUNTER — Other Ambulatory Visit: Payer: Self-pay | Admitting: Internal Medicine

## 2010-11-23 DIAGNOSIS — M792 Neuralgia and neuritis, unspecified: Secondary | ICD-10-CM

## 2010-11-23 DIAGNOSIS — M4800 Spinal stenosis, site unspecified: Secondary | ICD-10-CM

## 2010-11-23 DIAGNOSIS — M5430 Sciatica, unspecified side: Secondary | ICD-10-CM

## 2010-11-23 DIAGNOSIS — M549 Dorsalgia, unspecified: Secondary | ICD-10-CM

## 2010-11-24 ENCOUNTER — Other Ambulatory Visit: Payer: Medicare Other

## 2010-11-25 ENCOUNTER — Telehealth: Payer: Self-pay

## 2010-11-25 ENCOUNTER — Encounter: Payer: Self-pay | Admitting: Internal Medicine

## 2010-11-25 DIAGNOSIS — H9209 Otalgia, unspecified ear: Secondary | ICD-10-CM

## 2010-11-25 NOTE — Telephone Encounter (Signed)
Refer done.

## 2010-11-25 NOTE — Telephone Encounter (Signed)
Called pt no answer LMOM md ok referral will be call by [facility] once appt has been set-up.Marland KitchenMarland KitchenMarland Kitchen9/14/12@1 :52pm/LMB

## 2010-11-25 NOTE — Telephone Encounter (Signed)
Pt called requesting referral to ENT for persistent ear pressure and fullness.

## 2011-01-16 ENCOUNTER — Other Ambulatory Visit: Payer: Self-pay | Admitting: *Deleted

## 2011-01-16 MED ORDER — ALPRAZOLAM 0.5 MG PO TABS: 0.5000 mg | ORAL_TABLET | Freq: Two times a day (BID) | ORAL | Status: DC | PRN

## 2011-01-16 NOTE — Telephone Encounter (Signed)
Pt left vm requesting refills on her alprazolam. Pls [facility] @ [phone]...01/16/11@12 :00pm/LMB

## 2011-01-16 NOTE — Telephone Encounter (Signed)
Called pt no answer LMOM Rx sent to [facility] pharmacy...01/16/11

## 2011-04-26 ENCOUNTER — Other Ambulatory Visit: Payer: Self-pay | Admitting: Internal Medicine

## 2011-08-17 ENCOUNTER — Other Ambulatory Visit: Payer: Self-pay

## 2011-08-17 NOTE — Telephone Encounter (Signed)
Ov today.

## 2011-08-17 NOTE — Telephone Encounter (Signed)
Pt is completely out of medication and her BP is elevated at 168/98, please advise.

## 2011-08-17 NOTE — Telephone Encounter (Signed)
Pt called stating she is having significant financial trouble and cannot pay for medications, specifically BP meds. Pt is [facility] pay, please advise on this Dr Felicity Coyer pt, thanks!

## 2011-08-17 NOTE — Telephone Encounter (Signed)
Please see dr Felicity Coyer for this--overdue for appt

## 2011-08-18 NOTE — Telephone Encounter (Signed)
PT HAS REFUSED APPTS TODAY AND SAT.  SHE IS ON THE SCHEDULE FOR Monday WITH DR. Felicity Coyer.

## 2011-08-21 ENCOUNTER — Ambulatory Visit: Payer: Medicare HMO | Admitting: Internal Medicine

## 2011-08-21 DIAGNOSIS — Z0289 Encounter for other administrative examinations: Secondary | ICD-10-CM

## 2011-08-23 ENCOUNTER — Other Ambulatory Visit: Payer: Self-pay | Admitting: Internal Medicine

## 2011-10-25 ENCOUNTER — Other Ambulatory Visit (INDEPENDENT_AMBULATORY_CARE_PROVIDER_SITE_OTHER): Payer: Medicare HMO

## 2011-10-25 ENCOUNTER — Ambulatory Visit (INDEPENDENT_AMBULATORY_CARE_PROVIDER_SITE_OTHER): Payer: Medicare HMO | Admitting: Internal Medicine

## 2011-10-25 ENCOUNTER — Encounter: Payer: Self-pay | Admitting: Internal Medicine

## 2011-10-25 VITALS — BP 144/102 | HR 74 | Temp 97.8°F | Ht 60.0 in | Wt 216.4 lb

## 2011-10-25 DIAGNOSIS — E785 Hyperlipidemia, unspecified: Secondary | ICD-10-CM

## 2011-10-25 DIAGNOSIS — Z1239 Encounter for other screening for malignant neoplasm of breast: Secondary | ICD-10-CM

## 2011-10-25 DIAGNOSIS — I10 Essential (primary) hypertension: Secondary | ICD-10-CM

## 2011-10-25 DIAGNOSIS — Z0000 Encounter for general adult medical examination without abnormal findings: Secondary | ICD-10-CM

## 2011-10-25 DIAGNOSIS — M25569 Pain in unspecified knee: Secondary | ICD-10-CM

## 2011-10-25 DIAGNOSIS — Z23 Encounter for immunization: Secondary | ICD-10-CM

## 2011-10-25 DIAGNOSIS — Z1211 Encounter for screening for malignant neoplasm of colon: Secondary | ICD-10-CM

## 2011-10-25 DIAGNOSIS — Z1231 Encounter for screening mammogram for malignant neoplasm of breast: Secondary | ICD-10-CM

## 2011-10-25 LAB — HEPATIC FUNCTION PANEL
AST: 22 U/L (ref 0–37)
Total Bilirubin: 0.4 mg/dL (ref 0.3–1.2)

## 2011-10-25 LAB — URINALYSIS, ROUTINE W REFLEX MICROSCOPIC
Bilirubin Urine: NEGATIVE
Ketones, ur: NEGATIVE
Urobilinogen, UA: 0.2 (ref 0.0–1.0)

## 2011-10-25 LAB — CBC WITH DIFFERENTIAL/PLATELET
Basophils Absolute: 0 10*3/uL (ref 0.0–0.1)
Eosinophils Absolute: 0.1 10*3/uL (ref 0.0–0.7)
HCT: 39.6 % (ref 36.0–46.0)
Hemoglobin: 12.6 g/dL (ref 12.0–15.0)
Lymphocytes Relative: 22.2 % (ref 12.0–46.0)
Lymphs Abs: 1.7 10*3/uL (ref 0.7–4.0)
MCHC: 31.9 g/dL (ref 30.0–36.0)
Monocytes Absolute: 0.6 10*3/uL (ref 0.1–1.0)
Neutro Abs: 5.3 10*3/uL (ref 1.4–7.7)
RDW: 16.1 % — ABNORMAL HIGH (ref 11.5–14.6)

## 2011-10-25 LAB — LIPID PANEL
Total CHOL/HDL Ratio: 6
Triglycerides: 105 mg/dL (ref 0.0–149.0)

## 2011-10-25 LAB — BASIC METABOLIC PANEL
CO2: 27 mEq/L (ref 19–32)
Calcium: 9 mg/dL (ref 8.4–10.5)
Glucose, Bld: 90 mg/dL (ref 70–99)
Sodium: 142 mEq/L (ref 135–145)

## 2011-10-25 LAB — LDL CHOLESTEROL, DIRECT: Direct LDL: 195.3 mg/dL

## 2011-10-25 MED ORDER — ATORVASTATIN CALCIUM 40 MG PO TABS: 40.0000 mg | ORAL_TABLET | Freq: Every day | ORAL | Status: DC

## 2011-10-25 MED ORDER — LOSARTAN POTASSIUM 50 MG PO TABS: 50.0000 mg | ORAL_TABLET | Freq: Every day | ORAL | Status: DC

## 2011-10-25 MED ORDER — HYDROCHLOROTHIAZIDE 12.5 MG PO CAPS: 12.5000 mg | ORAL_CAPSULE | Freq: Every day | ORAL | Status: DC

## 2011-10-25 MED ORDER — AMLODIPINE BESYLATE 10 MG PO TABS: 10.0000 mg | ORAL_TABLET | Freq: Every day | ORAL | Status: DC

## 2011-10-25 NOTE — Patient Instructions (Addendum)
It was good to see you today. We have reviewed your prior records including labs and tests today Health Maintenance reviewed - Tetanus shot updated today, referrals for screening done - all other recommended immunizations and age-appropriate screenings are up-to-date.  we'll make referral to orthopedics for your knee, for mammogram and colon screening. Our office will contact you regarding appointment(s) once made. Temporary parking disability signed today Test(s) ordered today. Your results will be called to you after review (48-72hours after test completion). If any changes need to be made, you will be notified at that time. Medications changed to generics for blood pressure and cholesterol as discussed - see below Your prescription(s) have been submitted to your local [facility]. Please take as directed and contact our office if you believe you are having problem(s) with the medication(s). follow up in 2 weeks for blood pressure check with nurse visit Please schedule followup in 3-4 months for blood pressure and cholesterol recheck, call sooner if problems.   Health Maintenance, Females A healthy lifestyle and preventative care can promote health and wellness.  Maintain regular health, dental, and eye exams.   Eat a healthy diet. Foods like vegetables, fruits, whole grains, low-fat dairy products, and lean protein foods contain the nutrients you need without too many calories. Decrease your intake of foods high in solid fats, added sugars, and salt. Get information about a proper diet from your caregiver, if necessary.   Regular physical exercise is one of the most important things you can do for your health. Most adults should get at least 150 minutes of moderate-intensity exercise (any activity that increases your heart rate and causes you to sweat) each week. In addition, most adults need muscle-strengthening exercises on 2 or more days a week.     Maintain a healthy weight. The body  mass index (BMI) is a screening tool to identify possible weight problems. It provides an estimate of body fat based on height and weight. Your caregiver can help determine your BMI, and can help you achieve or maintain a healthy weight. For adults 20 years and older:   A BMI below 18.5 is considered underweight.   A BMI of 18.5 to 24.9 is normal.   A BMI of 25 to 29.9 is considered overweight.   A BMI of 30 and above is considered obese.   Maintain normal blood lipids and cholesterol by exercising and minimizing your intake of saturated fat. Eat a balanced diet with plenty of fruits and vegetables. Blood tests for lipids and cholesterol should begin at age 53 and be repeated every 5 years. If your lipid or cholesterol levels are high, you are over 50, or you are a high risk for heart disease, you may need your cholesterol levels checked more frequently. Ongoing high lipid and cholesterol levels should be treated with medicines if diet and exercise are not effective.   If you smoke, find out from your caregiver how to quit. If you do not use tobacco, do not start.   If you are pregnant, do not drink alcohol. If you are breastfeeding, be very cautious about drinking alcohol. If you are not pregnant and choose to drink alcohol, do not exceed 1 drink per day. One drink is considered to be 12 ounces (355 mL) of beer, 5 ounces (148 mL) of wine, or 1.5 ounces (44 mL) of liquor.   Avoid use of street drugs. Do not share needles with anyone. Ask for help if you need support or instructions about stopping  the use of drugs.   High blood pressure causes heart disease and increases the risk of stroke. Blood pressure should be checked at least every 1 to 2 years. Ongoing high blood pressure should be treated with medicines, if weight loss and exercise are not effective.   If you are 84 to 66 years old, ask your caregiver if you should take aspirin to prevent strokes.   Diabetes screening involves taking a  blood sample to check your fasting blood sugar level. This should be done once every 3 years, after age 63, if you are within normal weight and without risk factors for diabetes. Testing should be considered at a younger age or be carried out more frequently if you are overweight and have at least 1 risk factor for diabetes.   Breast cancer screening is essential preventative care for women. You should practice "breast self-awareness." This means understanding the normal appearance and feel of your breasts and may include breast self-examination. Any changes detected, no matter how small, should be reported to a caregiver. Women in their 38s and 30s should have a clinical breast exam (CBE) by a caregiver as part of a regular health exam every 1 to 3 years. After age 58, women should have a CBE every year. Starting at age 4, women should consider having a mammogram (breast X-ray) every year. Women who have a family history of breast cancer should talk to their caregiver about genetic screening. Women at a high risk of breast cancer should talk to their caregiver about having an MRI and a mammogram every year.   The Pap test is a screening test for cervical cancer. Women should have a Pap test starting at age 62. Between ages 53 and 54, Pap tests should be repeated every 2 years. Beginning at age 69, you should have a Pap test every 3 years as long as the past 3 Pap tests have been normal. If you had a hysterectomy for a problem that was not cancer or a condition that could lead to cancer, then you no longer need Pap tests. If you are between ages 89 and 40, and you have had normal Pap tests going back 10 years, you no longer need Pap tests. If you have had past treatment for cervical cancer or a condition that could lead to cancer, you need Pap tests and screening for cancer for at least 20 years after your treatment. If Pap tests have been discontinued, risk factors (such as a new sexual partner) need to be  reassessed to determine if screening should be resumed. Some women have medical problems that increase the chance of getting cervical cancer. In these cases, your caregiver may recommend more frequent screening and Pap tests.   The human papillomavirus (HPV) test is an additional test that may be used for cervical cancer screening. The HPV test looks for the virus that can cause the cell changes on the cervix. The cells collected during the Pap test can be tested for HPV. The HPV test could be used to screen women aged 44 years and older, and should be used in women of any age who have unclear Pap test results. After the age of 27, women should have HPV testing at the same frequency as a Pap test.   Colorectal cancer can be detected and often prevented. Most routine colorectal cancer screening begins at the age of 33 and continues through age 72. However, your caregiver may recommend screening at an earlier age if you have  risk factors for colon cancer. On a yearly basis, your caregiver may provide home test kits to check for hidden blood in the stool. Use of a small camera at the end of a tube, to directly examine the colon (sigmoidoscopy or colonoscopy), can detect the earliest forms of colorectal cancer. Talk to your caregiver about this at age 61, when routine screening begins. Direct examination of the colon should be repeated every 5 to 10 years through age 66, unless early forms of pre-cancerous polyps or small growths are found.   Hepatitis C blood testing is recommended for all people born from [birth year] through 1965 and any individual with known risks for hepatitis C.   Practice safe sex. Use condoms and avoid high-risk sexual practices to reduce the spread of sexually transmitted infections (STIs). Sexually active women aged 39 and younger should be checked for Chlamydia, which is a common sexually transmitted infection. Older women with new or multiple partners should also be tested for Chlamydia.  Testing for other STIs is recommended if you are sexually active and at increased risk.   Osteoporosis is a disease in which the bones lose minerals and strength with aging. This can result in serious bone fractures. The risk of osteoporosis can be identified using a bone density scan. Women ages 47 and over and women at risk for fractures or osteoporosis should discuss screening with their caregivers. Ask your caregiver whether you should be taking a calcium supplement or vitamin D to reduce the rate of osteoporosis.   Menopause can be associated with physical symptoms and risks. Hormone replacement therapy is available to decrease symptoms and risks. You should talk to your caregiver about whether hormone replacement therapy is right for you.   Use sunscreen with a sun protection factor (SPF) of 30 or greater. Apply sunscreen liberally and repeatedly throughout the day. You should seek shade when your shadow is shorter than you. Protect yourself by wearing long sleeves, pants, a wide-brimmed hat, and sunglasses year round, whenever you are outdoors.   Notify your caregiver of new moles or changes in moles, especially if there is a change in shape or color. Also notify your caregiver if a mole is larger than the size of a pencil eraser.   Stay current with your immunizations.  Document Released: 09/12/2010 Document Revised: 02/16/2011 Document Reviewed: 09/12/2010 [facility] Patient Information 2012 [place], [place].

## 2011-10-25 NOTE — Assessment & Plan Note (Signed)
Noncompliance with rx'd statin due to cost New rx today - check baseline, pre-tx labs with cpx labs today

## 2011-10-25 NOTE — Assessment & Plan Note (Signed)
BP Readings from Last 3 Encounters:  10/25/11 144/102  11/17/10 132/82  01/20/10 100/72   Compliance limited by cost of combo med - break into class components and new rx done today -  Nurse visit in 2 weeks to verify good control on resumed medications

## 2011-10-25 NOTE — Progress Notes (Signed)
Subjective:    Patient ID: Shelley Hayes, female    DOB: [birth year], 66 y.o.   MRN: [id number]  HPI   Here for medicare wellness  Diet: heart healthy  Physical activity: sedentary Depression/mood screen: negative Hearing: intact to whispered voice Visual acuity: grossly normal, performs annual eye exam  ADLs: capable Fall risk: none Home safety: good Cognitive evaluation: intact to orientation, naming, recall and repetition EOL planning: adv directives, full code/ I agree  I have personally reviewed and have noted 1. The patient's medical and social history 2. Their use of alcohol, tobacco or illicit drugs 3. Their current medications and supplements 4. The patient's functional ability including ADL's, fall risks, home safety risks and hearing or visual impairment. 5. Diet and physical activities 6. Evidence for depression or mood disorders   Also reviewed chronic medical issues  hypertension -reports noncompliance with rx'd medical treatment. denies adverse side effects related to rx'd therapy.   dyslipidemia - reports variable compliance with ongoing medical treatment and no changes in medication dose or frequency. denies adverse side effects related to current therapy.   glaucoma - dx 2010 - reports compliance with ongoing medical treatment and no changes in medication dose or frequency. denies adverse side effects related to current therapy. no change in vision   left knee pain since spring 2010 - onset following twist injury - improved with aleve - no swelling, but mild pain with ambulation and stairs - complicated by left thigh pain/sciatica from back DDD   chronic allergic rhinitis and sinusitus -chronic sputum from nares and chest, no chest pain, headache or vision change - takes claritin regularly - prev on singular   Past Medical History  Diagnosis Date  . ARTHRITIS   . ALLERGIC RHINITIS   . GLUCOMA   . HYPERLIPIDEMIA   . HYPERTENSION   . SCIATICA, LEFT 11/2010     Family History  Problem Relation Age of Onset  . Arthritis Mother   . Diabetes Mother   . Arthritis Father   . Alcohol abuse Brother   . Lung cancer Brother   . Prostate cancer Brother   . Diabetes Brother   . Hypertension Brother   . Colon cancer Mother 24  . Lung cancer Other     Neice   History  Substance Use Topics  . Smoking status: Never Smoker   . Smokeless tobacco: Not on file   Comment: Divorced, lives alone- in [place] for 22yrs but moved "home" to [place] in 2008  . Alcohol Use: No   Review of Systems  Constitutional: Negative for fever and unexpected weight change.  Respiratory: Negative for shortness of breath.   Cardiovascular: Negative for chest pain.  Genitourinary: Negative for difficulty urinating.  Neurological: Negative for weakness.       Objective:   Physical Exam  BP 144/102  Pulse 74  Temp 97.8 F (36.6 C) (Oral)  Ht 5' (1.524 m)  Wt 216 lb 6.4 oz (98.158 kg)  BMI 42.26 kg/m2  SpO2 98% Wt Readings from Last 3 Encounters:  10/25/11 216 lb 6.4 oz (98.158 kg)  11/17/10 214 lb 6.4 oz (97.251 kg)  01/20/10 215 lb 1.9 oz (97.577 kg)   Constitutional: She is overweight, but appears well-developed and well-nourished. No distress.  HENT: Head: Normocephalic and atraumatic. Ears: B TMs ok, no erythema or effusion; Nose: Nose normal. Mouth/Throat: Oropharynx is clear and moist. No oropharyngeal exudate.  Eyes: Conjunctivae and EOM are normal. Pupils are equal, round, and reactive to light.  No scleral icterus.  Neck: Normal range of motion. Neck supple. No JVD present. No thyromegaly present.  Cardiovascular: Normal rate, regular rhythm and normal heart sounds.  No murmur heard. No BLE edema. Pulmonary/Chest: Effort normal and breath sounds normal. No respiratory distress. She has no wheezes.  Abdominal: Soft. Bowel sounds are normal. She exhibits no distension. There is no tenderness. no masses Musculoskeletal: left knee - boggy synovitis - tender to  palpation over joint line; FROM and ligamentous function intact- no joint effusions. No gross deformities Neurological: She is alert and oriented to person, place, and time. No cranial nerve deficit. Coordination normal.  Skin: Skin is warm and dry. No rash noted. No erythema.  Psychiatric: She has a normal mood and affect. Her behavior is normal. Judgment and thought content normal.   Lab Results  Component Value Date   WBC 9500 12/19/2009   WBC 9.5 12/19/2009   HGB 12.0 12/19/2009   HGB 12.0 12/19/2009   HCT 37.7 12/19/2009   PLT 343 12/19/2009   GLUCOSE 94 12/20/2009   NA 142 12/20/2009   K 4.1 12/20/2009   CL 105 12/20/2009   CREATININE 0.74 12/20/2009   BUN 14 12/20/2009   CO2 31 12/20/2009   TSH 1.05 12/19/2009       Assessment & Plan:   CPX/AWV/v70.0 - Today patient counseled on age appropriate routine health concerns for screening and prevention, each reviewed and up to date or declined. Immunizations reviewed and up to date or declined. Labs ordered/reviewed. Risk factors for depression reviewed and negative. Hearing function and visual acuity are intact or addressed by ongoing specialist care. ADLs screened and addressed as needed. Functional ability and level of safety reviewed and appropriate. Education, counseling and referrals performed based on assessed risks today. Patient provided with a copy of personalized plan for preventive services.  L knee pain -exacerbated by accidental fall 06/2011 - ligamentous exam stable, no effusion - suspect pes anserine bursitis but refer to ortho at pt request given chronic pain preceding injury - temp handicap tag signed today  Also see problem list. Medications and labs reviewed today.

## 2011-10-26 ENCOUNTER — Encounter: Payer: Self-pay | Admitting: Internal Medicine

## 2011-10-30 ENCOUNTER — Encounter: Payer: Self-pay | Admitting: *Deleted

## 2011-11-14 ENCOUNTER — Ambulatory Visit: Payer: Medicare HMO

## 2011-12-06 DIAGNOSIS — Z0279 Encounter for issue of other medical certificate: Secondary | ICD-10-CM

## 2011-12-18 ENCOUNTER — Encounter: Payer: Medicare HMO | Admitting: Internal Medicine

## 2012-01-04 DIAGNOSIS — Z23 Encounter for immunization: Secondary | ICD-10-CM | POA: Diagnosis not present

## 2012-01-04 DIAGNOSIS — Z1231 Encounter for screening mammogram for malignant neoplasm of breast: Secondary | ICD-10-CM | POA: Diagnosis not present

## 2012-01-04 DIAGNOSIS — N83209 Unspecified ovarian cyst, unspecified side: Secondary | ICD-10-CM | POA: Diagnosis not present

## 2012-01-04 DIAGNOSIS — Z124 Encounter for screening for malignant neoplasm of cervix: Secondary | ICD-10-CM | POA: Diagnosis not present

## 2012-01-25 DIAGNOSIS — N8500 Endometrial hyperplasia, unspecified: Secondary | ICD-10-CM | POA: Diagnosis not present

## 2012-01-25 DIAGNOSIS — Q504 Embryonic cyst of fallopian tube: Secondary | ICD-10-CM | POA: Diagnosis not present

## 2012-02-15 DIAGNOSIS — R9389 Abnormal findings on diagnostic imaging of other specified body structures: Secondary | ICD-10-CM | POA: Diagnosis not present

## 2012-02-20 ENCOUNTER — Other Ambulatory Visit: Payer: Self-pay | Admitting: Internal Medicine

## 2012-03-15 DIAGNOSIS — R9389 Abnormal findings on diagnostic imaging of other specified body structures: Secondary | ICD-10-CM | POA: Diagnosis not present

## 2012-03-15 DIAGNOSIS — R935 Abnormal findings on diagnostic imaging of other abdominal regions, including retroperitoneum: Secondary | ICD-10-CM | POA: Diagnosis not present

## 2012-03-15 DIAGNOSIS — N840 Polyp of corpus uteri: Secondary | ICD-10-CM | POA: Diagnosis not present

## 2012-03-18 ENCOUNTER — Telehealth: Payer: Self-pay | Admitting: *Deleted

## 2012-03-18 NOTE — Telephone Encounter (Signed)
Patient Information:  Caller Name: Zinia  Phone: [phone]  Patient: Shelley, Hayes  Gender: Female  DOB: 29-Aug-1945  Age: 67 Years  PCP: Rene Paci (Adults only)  Office Follow Up:  Does the office need to follow up with this patient?: Yes  Instructions For The Office: Patient informed that it will be 03/19/12 before can possibly get MD approval for refill/ renewal of Xalatan eye drops.  RN Note:  Called office and spoke with nurse for Dr. Felicity Coyer.  Advised it will be at least 03/19/12 until this can be resolved.  Symptoms  Reason For Call & Symptoms: Patient requests Rx for eye drops sent to [facility] in [place], [place].  [phone].  Xalatan Drops requested.   Ran out of medication estimated 3 months ago.     Denies current symptoms, but states, "I know I have glaucoma and I need to start using the drops again."  Reviewed Health History In EMR: Yes  Reviewed Medications In EMR: Yes  Reviewed Allergies In EMR: Yes  Reviewed Surgeries / Procedures: Yes  Date of Onset of Symptoms: Unknown  Guideline(s) Used:  No Protocol Available - Sick Adult  Disposition Per Guideline:   Discuss with PCP and Callback by Nurse within 1 Hour  Reason For Disposition Reached:   Nursing judgment  Advice Given:  N/A

## 2012-03-18 NOTE — Telephone Encounter (Signed)
Pt is requesting a refill on her xalatan eye drops. Never been fill by Dr. Felicity Coyer. Is this ok pls send to [facility] [place] @ [phone]...lmb

## 2012-03-18 NOTE — Telephone Encounter (Signed)
A) ok to fill 1 month with 1 refill B) pt MUST f/u with her optho in next 60d re: glaucoma status to prevent worsening dz and possible vision loss - i can make refer if new eye doc needed

## 2012-03-19 MED ORDER — LATANOPROST 0.005 % OP SOLN: 1.0000 [drp] | Freq: Every day | OPHTHALMIC | Status: DC

## 2012-03-19 NOTE — Telephone Encounter (Signed)
Tried calling pt no answer couldn't leave vm due to vm being full...Raechel Chute

## 2012-03-19 NOTE — Telephone Encounter (Signed)
Notified pt with md response.../lmb 

## 2012-11-22 ENCOUNTER — Other Ambulatory Visit: Payer: Self-pay | Admitting: Internal Medicine

## 2012-12-18 DIAGNOSIS — S6980XA Other specified injuries of unspecified wrist, hand and finger(s), initial encounter: Secondary | ICD-10-CM | POA: Diagnosis not present

## 2012-12-18 DIAGNOSIS — S8990XA Unspecified injury of unspecified lower leg, initial encounter: Secondary | ICD-10-CM | POA: Diagnosis not present

## 2012-12-18 DIAGNOSIS — Z719 Counseling, unspecified: Secondary | ICD-10-CM | POA: Diagnosis not present

## 2012-12-23 ENCOUNTER — Other Ambulatory Visit: Payer: Self-pay | Admitting: Internal Medicine

## 2012-12-27 LAB — HM MAMMOGRAPHY

## 2012-12-30 ENCOUNTER — Encounter: Payer: Self-pay | Admitting: Internal Medicine

## 2012-12-30 ENCOUNTER — Ambulatory Visit (INDEPENDENT_AMBULATORY_CARE_PROVIDER_SITE_OTHER): Payer: Medicare Other | Admitting: Internal Medicine

## 2012-12-30 ENCOUNTER — Other Ambulatory Visit (INDEPENDENT_AMBULATORY_CARE_PROVIDER_SITE_OTHER): Payer: Medicare Other

## 2012-12-30 VITALS — BP 114/78 | HR 89 | Temp 98.4°F | Ht 60.0 in | Wt 196.4 lb

## 2012-12-30 DIAGNOSIS — Z23 Encounter for immunization: Secondary | ICD-10-CM | POA: Diagnosis not present

## 2012-12-30 DIAGNOSIS — Z13820 Encounter for screening for osteoporosis: Secondary | ICD-10-CM

## 2012-12-30 DIAGNOSIS — M79645 Pain in left finger(s): Secondary | ICD-10-CM

## 2012-12-30 DIAGNOSIS — I10 Essential (primary) hypertension: Secondary | ICD-10-CM

## 2012-12-30 DIAGNOSIS — E785 Hyperlipidemia, unspecified: Secondary | ICD-10-CM

## 2012-12-30 DIAGNOSIS — Z1211 Encounter for screening for malignant neoplasm of colon: Secondary | ICD-10-CM | POA: Diagnosis not present

## 2012-12-30 DIAGNOSIS — M79671 Pain in right foot: Secondary | ICD-10-CM

## 2012-12-30 DIAGNOSIS — Z0000 Encounter for general adult medical examination without abnormal findings: Secondary | ICD-10-CM

## 2012-12-30 DIAGNOSIS — M79609 Pain in unspecified limb: Secondary | ICD-10-CM

## 2012-12-30 DIAGNOSIS — H409 Unspecified glaucoma: Secondary | ICD-10-CM | POA: Diagnosis not present

## 2012-12-30 LAB — BASIC METABOLIC PANEL
BUN: 16 mg/dL (ref 6–23)
Calcium: 9.7 mg/dL (ref 8.4–10.5)
Chloride: 103 mEq/L (ref 96–112)
Creatinine, Ser: 0.9 mg/dL (ref 0.4–1.2)
GFR: 78.24 mL/min (ref >60.00)
Sodium: 140 mEq/L (ref 135–145)

## 2012-12-30 LAB — LDL CHOLESTEROL, DIRECT: Direct LDL: 198.9 mg/dL

## 2012-12-30 LAB — LIPID PANEL
Cholesterol: 262 mg/dL — ABNORMAL HIGH (ref 0–200)
HDL: 41.9 mg/dL (ref >39.00)
Total CHOL/HDL Ratio: 6

## 2012-12-30 MED ORDER — HYDROCHLOROTHIAZIDE 12.5 MG PO CAPS: ORAL_CAPSULE | ORAL | Status: DC

## 2012-12-30 MED ORDER — LORATADINE 10 MG PO TABS: 10.0000 mg | ORAL_TABLET | Freq: Every day | ORAL | Status: DC

## 2012-12-30 MED ORDER — LOSARTAN POTASSIUM 50 MG PO TABS: ORAL_TABLET | ORAL | Status: DC

## 2012-12-30 MED ORDER — PRAVASTATIN SODIUM 80 MG PO TABS: 80.0000 mg | ORAL_TABLET | Freq: Every day | ORAL | Status: DC

## 2012-12-30 MED ORDER — ATORVASTATIN CALCIUM 40 MG PO TABS: ORAL_TABLET | ORAL | Status: DC

## 2012-12-30 MED ORDER — ZOLPIDEM TARTRATE ER 12.5 MG PO TBCR: 12.5000 mg | EXTENDED_RELEASE_TABLET | Freq: Every evening | ORAL | Status: DC | PRN

## 2012-12-30 NOTE — Patient Instructions (Addendum)
It was good to see you today.  Your annual flu shot was given and/or updated today.  Health Maintenance reviewed - will refer for colonoscopy as discussed. Followup for mammogram as per gynecologist. All other recommended immunizations and age-appropriate screenings are up-to-date.  Will schedule bone density scan here as discussed to look for early osteopenia or osteoporosis changes  Medications reviewed and updated, stop Lipitor and began pravastatin -no other changes recommended at this time. Refill on medication(s) as discussed today.  Your prescription(s) have been submitted to your pharmacy. Please take as directed and contact our office if you believe you are having problem(s) with the medication(s).  we'll make referral to gastroenterology for screening colonoscopy and to eye specialist for followup on glaucoma. Will also refer to hand specialist for your left thumb pain and sports medicine specialist for a right foot pain -Our office will contact you regarding appointment(s) once made.  Please schedule followup in 6-12 months for annual exam labs, call sooner if problems.     Health Maintenance, Females A healthy lifestyle and preventative care can promote health and wellness.  Maintain regular health, dental, and eye exams.  Eat a healthy diet. Foods like vegetables, fruits, whole grains, low-fat dairy products, and lean protein foods contain the nutrients you need without too many calories. Decrease your intake of foods high in solid fats, added sugars, and salt. Get information about a proper diet from your caregiver, if necessary.  Regular physical exercise is one of the most important things you can do for your health. Most adults should get at least 150 minutes of moderate-intensity exercise (any activity that increases your heart rate and causes you to sweat) each week. In addition, most adults need muscle-strengthening exercises on 2 or more days a week.   Maintain a  healthy weight. The body mass index (BMI) is a screening tool to identify possible weight problems. It provides an estimate of body fat based on height and weight. Your caregiver can help determine your BMI, and can help you achieve or maintain a healthy weight. For adults 20 years and older:  A BMI below 18.5 is considered underweight.  A BMI of 18.5 to 24.9 is normal.  A BMI of 25 to 29.9 is considered overweight.  A BMI of 30 and above is considered obese.  Maintain normal blood lipids and cholesterol by exercising and minimizing your intake of saturated fat. Eat a balanced diet with plenty of fruits and vegetables. Blood tests for lipids and cholesterol should begin at age 24 and be repeated every 5 years. If your lipid or cholesterol levels are high, you are over 50, or you are a high risk for heart disease, you may need your cholesterol levels checked more frequently.Ongoing high lipid and cholesterol levels should be treated with medicines if diet and exercise are not effective.  If you smoke, find out from your caregiver how to quit. If you do not use tobacco, do not start.  If you are pregnant, do not drink alcohol. If you are breastfeeding, be very cautious about drinking alcohol. If you are not pregnant and choose to drink alcohol, do not exceed 1 drink per day. One drink is considered to be 12 ounces (355 mL) of beer, 5 ounces (148 mL) of wine, or 1.5 ounces (44 mL) of liquor.  Avoid use of street drugs. Do not share needles with anyone. Ask for help if you need support or instructions about stopping the use of drugs.  High blood pressure  causes heart disease and increases the risk of stroke. Blood pressure should be checked at least every 1 to 2 years. Ongoing high blood pressure should be treated with medicines, if weight loss and exercise are not effective.  If you are 6 to 67 years old, ask your caregiver if you should take aspirin to prevent strokes.  Diabetes screening  involves taking a blood sample to check your fasting blood sugar level. This should be done once every 3 years, after age 83, if you are within normal weight and without risk factors for diabetes. Testing should be considered at a younger age or be carried out more frequently if you are overweight and have at least 1 risk factor for diabetes.  Breast cancer screening is essential preventative care for women. You should practice "breast self-awareness." This means understanding the normal appearance and feel of your breasts and may include breast self-examination. Any changes detected, no matter how small, should be reported to a caregiver. Women in their 56s and 30s should have a clinical breast exam (CBE) by a caregiver as part of a regular health exam every 1 to 3 years. After age 7, women should have a CBE every year. Starting at age 41, women should consider having a mammogram (breast X-ray) every year. Women who have a family history of breast cancer should talk to their caregiver about genetic screening. Women at a high risk of breast cancer should talk to their caregiver about having an MRI and a mammogram every year.  The Pap test is a screening test for cervical cancer. Women should have a Pap test starting at age 37. Between ages 48 and 44, Pap tests should be repeated every 2 years. Beginning at age 75, you should have a Pap test every 3 years as long as the past 3 Pap tests have been normal. If you had a hysterectomy for a problem that was not cancer or a condition that could lead to cancer, then you no longer need Pap tests. If you are between ages 70 and 74, and you have had normal Pap tests going back 10 years, you no longer need Pap tests. If you have had past treatment for cervical cancer or a condition that could lead to cancer, you need Pap tests and screening for cancer for at least 20 years after your treatment. If Pap tests have been discontinued, risk factors (such as a new sexual  partner) need to be reassessed to determine if screening should be resumed. Some women have medical problems that increase the chance of getting cervical cancer. In these cases, your caregiver may recommend more frequent screening and Pap tests.  The human papillomavirus (HPV) test is an additional test that may be used for cervical cancer screening. The HPV test looks for the virus that can cause the cell changes on the cervix. The cells collected during the Pap test can be tested for HPV. The HPV test could be used to screen women aged 72 years and older, and should be used in women of any age who have unclear Pap test results. After the age of 80, women should have HPV testing at the same frequency as a Pap test.  Colorectal cancer can be detected and often prevented. Most routine colorectal cancer screening begins at the age of 50 and continues through age 77. However, your caregiver may recommend screening at an earlier age if you have risk factors for colon cancer. On a yearly basis, your caregiver may provide home test  kits to check for hidden blood in the stool. Use of a small camera at the end of a tube, to directly examine the colon (sigmoidoscopy or colonoscopy), can detect the earliest forms of colorectal cancer. Talk to your caregiver about this at age 72, when routine screening begins. Direct examination of the colon should be repeated every 5 to 10 years through age 28, unless early forms of pre-cancerous polyps or small growths are found.  Hepatitis C blood testing is recommended for all people born from [birth year] through 1965 and any individual with known risks for hepatitis C.  Practice safe sex. Use condoms and avoid high-risk sexual practices to reduce the spread of sexually transmitted infections (STIs). Sexually active women aged 52 and younger should be checked for Chlamydia, which is a common sexually transmitted infection. Older women with new or multiple partners should also be tested  for Chlamydia. Testing for other STIs is recommended if you are sexually active and at increased risk.  Osteoporosis is a disease in which the bones lose minerals and strength with aging. This can result in serious bone fractures. The risk of osteoporosis can be identified using a bone density scan. Women ages 45 and over and women at risk for fractures or osteoporosis should discuss screening with their caregivers. Ask your caregiver whether you should be taking a calcium supplement or vitamin D to reduce the rate of osteoporosis.  Menopause can be associated with physical symptoms and risks. Hormone replacement therapy is available to decrease symptoms and risks. You should talk to your caregiver about whether hormone replacement therapy is right for you.  Use sunscreen with a sun protection factor (SPF) of 30 or greater. Apply sunscreen liberally and repeatedly throughout the day. You should seek shade when your shadow is shorter than you. Protect yourself by wearing long sleeves, pants, a wide-brimmed hat, and sunglasses year round, whenever you are outdoors.  Notify your caregiver of new moles or changes in moles, especially if there is a change in shape or color. Also notify your caregiver if a mole is larger than the size of a pencil eraser.  Stay current with your immunizations. Document Released: 09/12/2010 Document Revised: 05/22/2011 Document Reviewed: 09/12/2010 [facility] Patient Information 2014 [place], [place].

## 2012-12-30 NOTE — Assessment & Plan Note (Signed)
Variable compliance with prescribed statin due to concerns of potential side effects We'll change atorvastatin to pravastatin at patient request -erx done Check lipid annually, titrate as needed The patient is asked to make an attempt to improve diet and exercise patterns to aid in medical management of this problem.

## 2012-12-30 NOTE — Progress Notes (Signed)
Pre-visit discussion using our clinic review tool. No additional management support is needed unless otherwise documented below in the visit note.  

## 2012-12-30 NOTE — Assessment & Plan Note (Signed)
Overdue for eye exam, will refer to ophthalmologist for followup. Continue eyedrops as ongoing

## 2012-12-30 NOTE — Assessment & Plan Note (Signed)
BP Readings from Last 3 Encounters:  12/30/12 114/78  10/25/11 144/102  11/17/10 132/82   The current medical regimen is effective;  continue present plan and medications.

## 2012-12-30 NOTE — Progress Notes (Signed)
Subjective:    Patient ID: Shelley Hayes, female    DOB: 09/02/1945, 67 y.o.   MRN: [id number]  HPI  Patient here today for routine medical exam -   Also interval medical events and chronic medical issues reviewed  Hypertension - patient reports that her blood pressure has been much better controlled since walking 2 miles a day.  She has intentionally lost >20 pounds since 10/2011.  She is intermittently taking her medications due to concerns about hypotension.    Dyslipidemia - reports variable compliance with ongoing medical treatment and no changes in doses or frequency. Concerned atorvastatin because diabetes, question other medication. Reports improved diet since May 14.  Denies adverse side effects related to current therapy.   Glaucoma - dx 2010 - overdue for eye exam, reports compliance with ongoing treatment.  Denies adverse effects.  No change in vision.   Chronic allergic rhinitis and sinusitis - takes claritin for symptom relief.  No changes in dose or frequency.  Denies adverse effects.   Past Medical History  Diagnosis Date  . ARTHRITIS   . ALLERGIC RHINITIS   . GLUCOMA   . HYPERLIPIDEMIA   . HYPERTENSION   . SCIATICA, LEFT 11/2010   Family History  Problem Relation Age of Onset  . Arthritis Mother   . Diabetes Mother   . Arthritis Father   . Alcohol abuse Brother   . Lung cancer Brother   . Prostate cancer Brother   . Diabetes Brother   . Hypertension Brother   . Colon cancer Mother 60  . Lung cancer Other     Neice   History  Substance Use Topics  . Smoking status: Never Smoker   . Smokeless tobacco: Not on file     Comment: Divorced, lives alone- in [place] for [place]yrs but moved "home" to [place] in 2008  . Alcohol Use: No    Review of Systems  Constitutional: Negative for chills, appetite change, fatigue and unexpected weight change.  HENT: Negative for congestion, ear discharge, ear pain, postnasal drip and sinus pressure.   Eyes: Negative for  photophobia, pain, redness and visual disturbance.  Respiratory: Negative for cough, chest tightness, shortness of breath and wheezing.   Cardiovascular: Negative for chest pain, palpitations and leg swelling.  Gastrointestinal: Negative for nausea, vomiting, abdominal pain, diarrhea, constipation and abdominal distention.  Genitourinary: Negative for dysuria and difficulty urinating.  Musculoskeletal: Positive for gait problem. Negative for arthralgias, back pain, joint swelling and myalgias.       Left thumb with pain and tenderness and lack of mobility x 6 months since accidental injury when she sprained thumb falling from bed. Right foot with arch pain when ambulating   Skin: Negative for rash and wound.  Neurological: Negative for dizziness, syncope, weakness, light-headedness and headaches.  Psychiatric/Behavioral: Negative.  Negative for dysphoric mood. The patient is not nervous/anxious.   All other systems reviewed and are negative.       Objective:   Physical Exam  Vitals reviewed. Constitutional: She is oriented to person, place, and time. She appears well-developed and well-nourished. No distress.  HENT:  Head: Normocephalic and atraumatic.  Right Ear: External ear normal.  Left Ear: External ear normal.  Nose: Nose normal.  Mouth/Throat: Oropharynx is clear and moist. No oropharyngeal exudate.  Eyes: Conjunctivae and EOM are normal. Pupils are equal, round, and reactive to light. Right eye exhibits no discharge. Left eye exhibits no discharge. No scleral icterus.  Neck: Normal range of motion. Neck supple.  No thyromegaly present.  Cardiovascular: Normal rate, regular rhythm and normal heart sounds.  Exam reveals no gallop.   No murmur heard. Pulmonary/Chest: Effort normal and breath sounds normal. No respiratory distress. She has no wheezes.  Abdominal: Soft. Bowel sounds are normal. She exhibits no distension and no mass. There is no tenderness. There is no rebound and no  guarding.  Musculoskeletal:  Left thumb with decreased active and passive range of motion, especially flexion at PIP  - tender to palpation laterally, but no gross deformity.  No swelling or erythema.    Right foot with intact range of motion, no swelling, erythema, tenderness to palpation at arch and metatarsal junction medially.  Lymphadenopathy:    She has no cervical adenopathy.  Neurological: She is alert and oriented to person, place, and time.  Skin: Skin is warm and dry. No rash noted. She is not diaphoretic.  Psychiatric: She has a normal mood and affect. Her behavior is normal. Judgment and thought content normal.    Wt Readings from Last 3 Encounters:  12/30/12 196 lb 6.4 oz (89.086 kg)  10/25/11 216 lb 6.4 oz (98.158 kg)  11/17/10 214 lb 6.4 oz (97.251 kg)   BP Readings from Last 3 Encounters:  12/30/12 114/78  10/25/11 144/102  11/17/10 132/82   Lab Results  Component Value Date   WBC 7.8 10/25/2011   HGB 12.6 10/25/2011   HCT 39.6 10/25/2011   PLT 300.0 10/25/2011   GLUCOSE 90 10/25/2011   CHOL 258* 10/25/2011   TRIG 105.0 10/25/2011   HDL 44.20 10/25/2011   LDLDIRECT 195.3 10/25/2011   ALT 18 10/25/2011   AST 22 10/25/2011   NA 142 10/25/2011   K 3.3* 10/25/2011   CL 106 10/25/2011   CREATININE 0.7 10/25/2011   BUN 11 10/25/2011   CO2 27 10/25/2011   TSH 0.86 10/25/2011   ECG normal sinus 84 beats per minute -mild anterior Q., no acute change or arrhythmia    Assessment & Plan:   CPX/v70.0 - Today patient counseled on age appropriate routine health concerns for screening and prevention, each reviewed and up to date or declined. Immunizations reviewed and up to date or declined. Labs/ECG reviewed. Risk factors for depression reviewed and negative. Hearing function and visual acuity are intact. ADLs screened and addressed as needed. Functional ability and level of safety reviewed and appropriate. Education, counseling and referrals performed based on assessed risks today.  Patient provided with a copy of personalized plan for preventive services.   Left thumb pain. Limited flexion at the interphalangeal joint, suspect dislocation of extensor tendon. Refer to hand specialist for evaluation and treatment of same  Right plantar fasciitis with metatarsal arthritis. Refer to sports medicine for creation of new arch support. Suggested anti-inflammatories such as over-the-counter Aleve. Other treatment per sports medicine specialist as needed  Also See problem list. Medications and labs reviewed today.

## 2013-01-03 ENCOUNTER — Telehealth: Payer: Self-pay

## 2013-01-03 NOTE — Telephone Encounter (Signed)
Called patient and left another message with MD message regarding her lab results.

## 2013-01-05 ENCOUNTER — Encounter: Payer: Self-pay | Admitting: Internal Medicine

## 2013-01-08 ENCOUNTER — Ambulatory Visit: Payer: Medicare Other | Admitting: Family Medicine

## 2013-01-14 ENCOUNTER — Telehealth: Payer: Self-pay | Admitting: *Deleted

## 2013-01-14 NOTE — Telephone Encounter (Signed)
ToRoma Schanz Fax: [phone] From: [facility] Date/ Time: 01/08/2013 6:27 AM Taken By: Heinz Knuckles, CSR Caller: Charlton Lions Facility: not collected Patient: Hortense Ramal DOB: [birth year] Phone: [phone] Reason for Call: See info below Regarding Appointment: Yes Appt Date: 01/08/2013 Appt Time: 9:30:00 AM Provider: Antoine Primas Reason: Details: Pt is unable to make her apt today she had a death in the family Outcome: Cancelled appointment in EP[facility])

## 2013-01-28 ENCOUNTER — Other Ambulatory Visit: Payer: Self-pay | Admitting: Internal Medicine

## 2013-02-05 ENCOUNTER — Encounter: Payer: Self-pay | Admitting: Internal Medicine

## 2013-02-05 MED ORDER — AZITHROMYCIN 250 MG PO TABS: ORAL_TABLET | ORAL | Status: DC

## 2013-02-05 MED ORDER — PROMETHAZINE-DM 6.25-15 MG/5ML PO SYRP: 5.0000 mL | ORAL_SOLUTION | Freq: Four times a day (QID) | ORAL | Status: DC | PRN

## 2013-02-27 DIAGNOSIS — H40019 Open angle with borderline findings, low risk, unspecified eye: Secondary | ICD-10-CM | POA: Diagnosis not present

## 2013-02-27 DIAGNOSIS — H04129 Dry eye syndrome of unspecified lacrimal gland: Secondary | ICD-10-CM | POA: Diagnosis not present

## 2013-02-27 DIAGNOSIS — H43399 Other vitreous opacities, unspecified eye: Secondary | ICD-10-CM | POA: Diagnosis not present

## 2013-02-27 DIAGNOSIS — H2510 Age-related nuclear cataract, unspecified eye: Secondary | ICD-10-CM | POA: Diagnosis not present

## 2013-06-30 ENCOUNTER — Ambulatory Visit: Payer: Medicare Other | Admitting: Internal Medicine

## 2013-07-18 ENCOUNTER — Ambulatory Visit: Payer: Medicare Other | Admitting: Internal Medicine

## 2013-08-25 ENCOUNTER — Encounter: Payer: Self-pay | Admitting: Internal Medicine

## 2013-08-25 ENCOUNTER — Other Ambulatory Visit (INDEPENDENT_AMBULATORY_CARE_PROVIDER_SITE_OTHER): Payer: Medicare Other

## 2013-08-25 ENCOUNTER — Ambulatory Visit (INDEPENDENT_AMBULATORY_CARE_PROVIDER_SITE_OTHER): Payer: Medicare Other | Admitting: Internal Medicine

## 2013-08-25 VITALS — BP 132/82 | HR 72 | Temp 98.5°F | Wt 201.0 lb

## 2013-08-25 DIAGNOSIS — R079 Chest pain, unspecified: Secondary | ICD-10-CM

## 2013-08-25 DIAGNOSIS — E785 Hyperlipidemia, unspecified: Secondary | ICD-10-CM

## 2013-08-25 DIAGNOSIS — I10 Essential (primary) hypertension: Secondary | ICD-10-CM

## 2013-08-25 LAB — URINALYSIS, ROUTINE W REFLEX MICROSCOPIC
Bilirubin Urine: NEGATIVE
Ketones, ur: NEGATIVE
NITRITE: NEGATIVE
PH: 6 (ref 5.0–8.0)
SPECIFIC GRAVITY, URINE: 1.015 (ref 1.000–1.030)
Total Protein, Urine: NEGATIVE
Urine Glucose: NEGATIVE
Urobilinogen, UA: 0.2 (ref 0.0–1.0)

## 2013-08-25 LAB — CBC WITH DIFFERENTIAL/PLATELET
BASOS PCT: 0.4 % (ref 0.0–3.0)
Basophils Absolute: 0 10*3/uL (ref 0.0–0.1)
EOS ABS: 0.1 10*3/uL (ref 0.0–0.7)
EOS PCT: 1.9 % (ref 0.0–5.0)
HEMATOCRIT: 39.4 % (ref 36.0–46.0)
HEMOGLOBIN: 12.8 g/dL (ref 12.0–15.0)
LYMPHS ABS: 1.9 10*3/uL (ref 0.7–4.0)
Lymphocytes Relative: 27.2 % (ref 12.0–46.0)
MCHC: 32.6 g/dL (ref 30.0–36.0)
MCV: 85.6 fl (ref 78.0–100.0)
MONO ABS: 0.7 10*3/uL (ref 0.1–1.0)
Monocytes Relative: 9.4 % (ref 3.0–12.0)
NEUTROS ABS: 4.3 10*3/uL (ref 1.4–7.7)
NEUTROS PCT: 61.1 % (ref 43.0–77.0)
Platelets: 315 10*3/uL (ref 150.0–400.0)
RBC: 4.61 Mil/uL (ref 3.87–5.11)
RDW: 14.3 % (ref 11.5–15.5)
WBC: 7 10*3/uL (ref 4.0–10.5)

## 2013-08-25 LAB — BASIC METABOLIC PANEL
BUN: 11 mg/dL (ref 6–23)
CO2: 27 mEq/L (ref 19–32)
Calcium: 9.1 mg/dL (ref 8.4–10.5)
Chloride: 105 mEq/L (ref 96–112)
Creatinine, Ser: 0.8 mg/dL (ref 0.4–1.2)
GFR: 91.75 mL/min (ref >60.00)
Glucose, Bld: 97 mg/dL (ref 70–99)
POTASSIUM: 3 meq/L — AB (ref 3.5–5.1)
SODIUM: 142 meq/L (ref 135–145)

## 2013-08-25 LAB — LIPID PANEL
CHOL/HDL RATIO: 4
Cholesterol: 193 mg/dL (ref 0–200)
HDL: 44.7 mg/dL (ref >39.00)
LDL Cholesterol: 135 mg/dL — ABNORMAL HIGH (ref 0–99)
NonHDL: 148.3
TRIGLYCERIDES: 69 mg/dL (ref 0.0–149.0)
VLDL: 13.8 mg/dL (ref 0.0–40.0)

## 2013-08-25 LAB — HEPATIC FUNCTION PANEL
ALBUMIN: 4.1 g/dL (ref 3.5–5.2)
ALT: 13 U/L (ref 0–35)
AST: 20 U/L (ref 0–37)
Alkaline Phosphatase: 71 U/L (ref 39–117)
BILIRUBIN DIRECT: 0.1 mg/dL (ref 0.0–0.3)
TOTAL PROTEIN: 7.8 g/dL (ref 6.0–8.3)
Total Bilirubin: 0.5 mg/dL (ref 0.2–1.2)

## 2013-08-25 NOTE — Progress Notes (Signed)
Subjective:    Patient ID: Shelley Hayes, female    DOB: 23-Nov-1945, 68 y.o.   MRN: [id number]  HPI  Patient is here for follow up  Reviewed chronic medical issues and interval medical events  Past Medical History  Diagnosis Date  . ARTHRITIS   . ALLERGIC RHINITIS   . GLUCOMA   . HYPERLIPIDEMIA   . HYPERTENSION   . SCIATICA, LEFT 11/2010    Review of Systems  Constitutional: Negative for fever, fatigue and unexpected weight change.  Respiratory: Negative for cough, chest tightness and shortness of breath.   Cardiovascular: Positive for chest pain (SSCP "pressure", 2-4 min spells, at rest or exertion, "something sitting on me" - onset 2 weeks ago, occurs daily) and palpitations (occ flutter). Negative for leg swelling.  Genitourinary: Positive for flank pain (R in AM only, when getting out of bed, resolved with upright). Negative for dysuria and frequency.  Neurological: Negative for light-headedness and headaches.       Objective:   Physical Exam  BP 132/82  Pulse 72  Temp(Src) 98.5 F (36.9 C) (Oral)  Wt 201 lb (91.173 kg)  SpO2 97% Wt Readings from Last 3 Encounters:  08/25/13 201 lb (91.173 kg)  12/30/12 196 lb 6.4 oz (89.086 kg)  10/25/11 216 lb 6.4 oz (98.158 kg)   Constitutional: She is obese, but appears well-developed and well-nourished. No distress.  Neck: Normal range of motion. Neck supple. No JVD present. No thyromegaly present.  Cardiovascular: Normal rate, regular rhythm and normal heart sounds.  No murmur heard. No BLE edema. Pulmonary/Chest: Effort normal and breath sounds normal. No respiratory distress. She has no wheezes.  Psychiatric: She has a normal mood and affect. Her behavior is normal. Judgment and thought content normal.   Lab Results  Component Value Date   WBC 7.8 10/25/2011   HGB 12.6 10/25/2011   HCT 39.6 10/25/2011   PLT 300.0 10/25/2011   GLUCOSE 102* 12/30/2012   CHOL 262* 12/30/2012   TRIG 109.0 12/30/2012   HDL 41.90 12/30/2012     LDLDIRECT 198.9 12/30/2012   ALT 18 10/25/2011   AST 22 10/25/2011   NA 140 12/30/2012   K 3.6 12/30/2012   CL 103 12/30/2012   CREATININE 0.9 12/30/2012   BUN 16 12/30/2012   CO2 30 12/30/2012   TSH 0.86 10/25/2011    Mr Lumbar Spine Wo Contrast  11/23/2010   *RADIOLOGY REPORT*  Clinical Data: Back pain extending into thighs bilaterally for 17 years getting worse.  MRI LUMBAR SPINE WITHOUT CONTRAST  Technique:  Multiplanar and multiecho pulse sequences of the lumbar spine were obtained without intravenous contrast.  Comparison: 11/17/2010 plain film exam [facility].  Findings: Bilateral renal lesions suggestive of cysts incompletely evaluated on the present exam.  Last fully open disc space is labeled L5-S1.  Present examination incorporates from T10 through the lower sacrum.  Conus upper L2 level.  Sagittal imaging of T10-L1 reveals degenerative changes T10-11 through T12-L1 most prominent at the T10-T11 level where bulge/protrusion and posterior element hypertrophy contribute to spinal stenosis with mild cord deformity with bilateral foraminal narrowing greater on the right.  Thoracic spine MR can be obtained for further delineation if clinically desired.  L1-2: Very mild bulge.  L2-3:  Bulge with very mild spinal stenosis.  Lateral extension touches but does not compress the exiting nerve roots.  L3-4:  Bulge.  Very mild spinal stenosis.  Lateral extension (greater on the right) touches but does not compress the  exiting nerve roots.  Mild facet joint degenerative changes.  Mild ligamentum flavum hypertrophy.  L4-5:  Broad-based disc osteophyte complex.  Centrally there is slight caudal extension.  Mild bilateral facet joint degenerative changes and ligamentum flavum hypertrophy.  Mild spinal stenosis. Lateral disc extension greater to the left.  Mild encroachment upon but not significant compression of the exiting nerve roots.  L5-S1:  Mild facet joint degenerative changes greater on the  right. Minimal bulge.  IMPRESSION: In the lumbar spine, degenerative changes are most prominent at the L4-5 level as detailed above.  In the lower thoracic spine, spinal stenosis most notable T10-T11 level as detailed above.  Original Report Authenticated By: Doug Sou, M.D.      Assessment & Plan:   Intermittent substernal chest pain Pt reports chest pain occurs at rest as well as exertion symptoms new in past 2 weeks  Given cardiac risk factors, ECG today: normal sinus at 67 beats per minute. Nonspecific T wave abnormality, no ischemic change or arrhythmia  Also refer for cardiac stress test and check labs Encourage compliance with Aspirin daily and other medications as prescribed until cardiac testing complaint  Problem List Items Addressed This Visit   HYPERLIPIDEMIA     reports compliance with prescribed statin since 12/2012 due to concerns of potential side effects, we changed atorvastatin to pravastatin 12/2012 Check lipid now- titrate as needed The patient is asked to make an attempt to improve diet and exercise patterns to aid in medical management of this problem.     HYPERTENSION      BP Readings from Last 3 Encounters:  08/25/13 132/82  12/30/12 114/78  10/25/11 144/102   The current medical regimen is effective;  continue present plan and medications.      Other Visit Diagnoses   Chest pain    -  Primary

## 2013-08-25 NOTE — Assessment & Plan Note (Signed)
BP Readings from Last 3 Encounters:  08/25/13 132/82  12/30/12 114/78  10/25/11 144/102   The current medical regimen is effective;  continue present plan and medications.

## 2013-08-25 NOTE — Patient Instructions (Addendum)
It was good to see you today.  We have reviewed your prior records including labs and tests today  Test(s) ordered today. Your results will be released to [place] (or called to you) after review, usually within 72hours after test completion. If any changes need to be made, you will be notified at that same time.  Medications reviewed and updated, no changes recommended at this time.  we'll make referral for cardiac stress test. Our office will contact you regarding appointment(s) once made.  Please schedule followup in 6 months for annual review, call sooner if problems.  Chest Pain (Nonspecific) It is often hard to give a specific diagnosis for the cause of chest pain. There is always a chance that your pain could be related to something serious, such as a heart attack or a blood clot in the lungs. You need to follow up with your caregiver for further evaluation. CAUSES   Heartburn.  Pneumonia or bronchitis.  Anxiety or stress.  Inflammation around your heart (pericarditis) or lung (pleuritis or pleurisy).  A blood clot in the lung.  A collapsed lung (pneumothorax). It can develop suddenly on its own (spontaneous pneumothorax) or from injury (trauma) to the chest.  Shingles infection (herpes zoster virus). The chest wall is composed of bones, muscles, and cartilage. Any of these can be the source of the pain.  The bones can be bruised by injury.  The muscles or cartilage can be strained by coughing or overwork.  The cartilage can be affected by inflammation and become sore (costochondritis). DIAGNOSIS  Lab tests or other studies, such as X-rays, electrocardiography, stress testing, or cardiac imaging, may be needed to find the cause of your pain.  TREATMENT   Treatment depends on what may be causing your chest pain. Treatment may include:  Acid blockers for heartburn.  Anti-inflammatory medicine.  Pain medicine for inflammatory conditions.  Antibiotics if an infection  is present.  You may be advised to change lifestyle habits. This includes stopping smoking and avoiding alcohol, caffeine, and chocolate.  You may be advised to keep your head raised (elevated) when sleeping. This reduces the chance of acid going backward from your stomach into your esophagus.  Most of the time, nonspecific chest pain will improve within 2 to 3 days with rest and mild pain medicine. HOME CARE INSTRUCTIONS   If antibiotics were prescribed, take your antibiotics as directed. Finish them even if you start to feel better.  For the next few days, avoid physical activities that bring on chest pain. Continue physical activities as directed.  Do not smoke.  Avoid drinking alcohol.  Only take over-the-counter or prescription medicine for pain, discomfort, or fever as directed by your caregiver.  Follow your caregiver's suggestions for further testing if your chest pain does not go away.  Keep any follow-up appointments you made. If you do not go to an appointment, you could develop lasting (chronic) problems with pain. If there is any problem keeping an appointment, you must call to reschedule. SEEK MEDICAL CARE IF:   You think you are having problems from the medicine you are taking. Read your medicine instructions carefully.  Your chest pain does not go away, even after treatment.  You develop a rash with blisters on your chest. SEEK IMMEDIATE MEDICAL CARE IF:   You have increased chest pain or pain that spreads to your arm, neck, jaw, back, or abdomen.  You develop shortness of breath, an increasing cough, or you are coughing up blood.  You  have severe back or abdominal pain, feel nauseous, or vomit.  You develop severe weakness, fainting, or chills.  You have a fever. THIS IS AN EMERGENCY. Do not wait to see if the pain will go away. Get medical help at once. Call your local emergency services (911 in [place] Do not drive yourself to the hospital. MAKE SURE YOU:    Understand these instructions.  Will watch your condition.  Will get help right away if you are not doing well or get worse. Document Released: 12/07/2004 Document Revised: 05/22/2011 Document Reviewed: 10/03/2007 [facility] Patient Information 2014 [place].

## 2013-08-25 NOTE — Progress Notes (Signed)
Pre visit review using our clinic review tool, if applicable. No additional management support is needed unless otherwise documented below in the visit note. 

## 2013-08-25 NOTE — Assessment & Plan Note (Signed)
reports compliance with prescribed statin since 12/2012 due to concerns of potential side effects, we changed atorvastatin to pravastatin 12/2012 Check lipid now- titrate as needed The patient is asked to make an attempt to improve diet and exercise patterns to aid in medical management of this problem.

## 2013-08-26 MED ORDER — CIPROFLOXACIN HCL 500 MG PO TABS: 500.0000 mg | ORAL_TABLET | Freq: Two times a day (BID) | ORAL | Status: DC

## 2013-08-26 MED ORDER — POTASSIUM CHLORIDE ER 10 MEQ PO TBCR: 10.0000 meq | EXTENDED_RELEASE_TABLET | Freq: Every day | ORAL | Status: DC

## 2013-08-26 NOTE — Addendum Note (Signed)
Addended by: Gwendolyn Grant A on: 08/26/2013 08:16 AM   Modules accepted: Orders

## 2013-08-29 DIAGNOSIS — H40019 Open angle with borderline findings, low risk, unspecified eye: Secondary | ICD-10-CM | POA: Diagnosis not present

## 2013-10-15 ENCOUNTER — Ambulatory Visit (HOSPITAL_COMMUNITY): Payer: Medicare Other | Attending: Cardiology | Admitting: Radiology

## 2013-10-15 VITALS — BP 133/83 | HR 82 | Ht 60.0 in | Wt 200.0 lb

## 2013-10-15 DIAGNOSIS — E785 Hyperlipidemia, unspecified: Secondary | ICD-10-CM | POA: Insufficient documentation

## 2013-10-15 DIAGNOSIS — I5189 Other ill-defined heart diseases: Secondary | ICD-10-CM | POA: Insufficient documentation

## 2013-10-15 DIAGNOSIS — R079 Chest pain, unspecified: Secondary | ICD-10-CM

## 2013-10-15 DIAGNOSIS — I10 Essential (primary) hypertension: Secondary | ICD-10-CM | POA: Diagnosis not present

## 2013-10-15 DIAGNOSIS — Z8249 Family history of ischemic heart disease and other diseases of the circulatory system: Secondary | ICD-10-CM | POA: Insufficient documentation

## 2013-10-15 MED ORDER — TECHNETIUM TC 99M SESTAMIBI GENERIC - CARDIOLITE
33.0000 | Freq: Once | INTRAVENOUS | Status: AC | PRN
  Administered 2013-10-15: 33 via INTRAVENOUS

## 2013-10-15 NOTE — Progress Notes (Signed)
[facility] [address] [place], [place] [address] [phone]    Cardiology Nuclear Med Study  Shelley Hayes is a 68 y.o. female     MRN : [id number]     DOB: 01-02-1946  Procedure Date: 10/15/2013  Nuclear Med Background Indication for Stress Test:  Evaluation for Ischemia History:  No known CAD, Echo 2011 EF 55-60%, MPI 2011 (normal) EF 72%, Glaucoma Cardiac Risk Factors: Family History - CAD, Hypertension and Lipids  Symptoms:  Chest Pain, Chest Pain with Exertion (last date of chest discomfort was yesterday) and Palpitations   Nuclear Pre-Procedure Caffeine/Decaff Intake:  None NPO After: 7:00pm   Lungs:  clear O2 Sat: 98% on room air. IV 0.9% NS with Angio Cath:  22g  IV Site: R Hand  IV Started by:  Crissie Figures, RN  Chest Size (in):  44 Cup Size: DD  Height: 5' (1.524 m)  Weight:  200 lb (90.719 kg)  BMI:  Body mass index is 39.06 kg/(m^2). Tech Comments:  N/A    Nuclear Med Study 1 or 2 day study: 2 day  Stress Test Type:  Stress  Reading MD: N/A  Order Authorizing Provider:  Gwendolyn Grant, MD  Resting Radionuclide: Technetium 82m Sestamibi  Resting Radionuclide Dose: 33.0 mCi on 10/21/13   Stress Radionuclide:  Technetium 6m Sestamibi  Stress Radionuclide Dose: 33.0 mCi on 10/15/13           Stress Protocol Rest HR: 82 Stress HR: 133  Rest BP: 133/83 Stress BP: 202/98  Exercise Time (min): 4:00 METS: 4.6           Dose of Adenosine (mg):  n/a Dose of Lexiscan: n/a mg  Dose of Atropine (mg): n/a Dose of Dobutamine: n/a mcg/kg/min (at max HR)  Stress Test Technologist: Glade Lloyd, BS-ES  Nuclear Technologist:  Annye Rusk, CNMT     Rest Procedure:  Myocardial perfusion imaging was performed at rest 45 minutes following the intravenous administration of Technetium 42m Sestamibi. Rest ECG: NSR - Normal EKG  Stress Procedure:  The patient exercised on the treadmill utilizing the Bruce Protocol for 4:00 minutes. The patient  stopped due to fatigue and denied any chest pain.  Technetium 49m Sestamibi was injected at peak exercise and myocardial perfusion imaging was performed after a brief delay. Stress ECG: No significant change from baseline ECG  QPS Raw Data Images:  Normal; no motion artifact; normal heart/lung ratio. Stress Images:  Small, mild apical inferior perfusion defect. Rest Images:  Small, mild apical inferior perfusion defect. Subtraction (SDS):  Fixed small, mild apical inferior perfusion defect. Transient Ischemic Dilatation (Normal <1.22):  0.95 Lung/Heart Ratio (Normal <0.45):  0.26  Quantitative Gated Spect Images QGS EDV:  61 ml QGS ESV:  17 ml  Impression Exercise Capacity:  Poor exercise capacity. BP Response:  Hypertensive blood pressure response. Clinical Symptoms:  None reported. ECG Impression:  No significant ST segment change suggestive of ischemia. Comparison with Prior Nuclear Study: No images to compare  Overall Impression:  Low risk stress nuclear study with a small, mild fixed apical inferior perfusion defect.  This most likely represents attenuation given normal wall motion. .  LV Ejection Fraction: 71%.  LV Wall Motion:  NL LV Function; NL Wall Motion  [name] 10/21/2013

## 2013-10-21 ENCOUNTER — Ambulatory Visit (HOSPITAL_COMMUNITY): Payer: Medicare Other | Attending: Cardiology | Admitting: Radiology

## 2013-10-21 DIAGNOSIS — R0989 Other specified symptoms and signs involving the circulatory and respiratory systems: Secondary | ICD-10-CM

## 2013-10-21 MED ORDER — TECHNETIUM TC 99M SESTAMIBI GENERIC - CARDIOLITE
33.0000 | Freq: Once | INTRAVENOUS | Status: AC | PRN
  Administered 2013-10-21: 33 via INTRAVENOUS

## 2014-01-27 ENCOUNTER — Encounter: Payer: Self-pay | Admitting: Internal Medicine

## 2014-01-27 NOTE — Telephone Encounter (Signed)
tammy to call to offer OV , any md, next avail

## 2014-01-28 NOTE — Telephone Encounter (Signed)
Left message for patient to call back to schedule.  °

## 2014-01-30 DIAGNOSIS — J209 Acute bronchitis, unspecified: Secondary | ICD-10-CM | POA: Diagnosis not present

## 2014-02-23 ENCOUNTER — Ambulatory Visit (INDEPENDENT_AMBULATORY_CARE_PROVIDER_SITE_OTHER)
Admission: RE | Admit: 2014-02-23 | Discharge: 2014-02-23 | Disposition: A | Discharge status: Home / Self Care | Payer: Medicare Other | Source: Ambulatory Visit | Attending: Internal Medicine | Admitting: Internal Medicine

## 2014-02-23 ENCOUNTER — Ambulatory Visit (INDEPENDENT_AMBULATORY_CARE_PROVIDER_SITE_OTHER): Payer: Medicare Other | Admitting: Internal Medicine

## 2014-02-23 ENCOUNTER — Other Ambulatory Visit (INDEPENDENT_AMBULATORY_CARE_PROVIDER_SITE_OTHER): Payer: Medicare Other

## 2014-02-23 ENCOUNTER — Encounter: Payer: Self-pay | Admitting: Internal Medicine

## 2014-02-23 VITALS — BP 134/78 | HR 84 | Temp 98.4°F | Ht 60.0 in | Wt 196.0 lb

## 2014-02-23 DIAGNOSIS — R058 Other specified cough: Secondary | ICD-10-CM

## 2014-02-23 DIAGNOSIS — Z23 Encounter for immunization: Secondary | ICD-10-CM | POA: Diagnosis not present

## 2014-02-23 DIAGNOSIS — Z0000 Encounter for general adult medical examination without abnormal findings: Secondary | ICD-10-CM | POA: Diagnosis not present

## 2014-02-23 DIAGNOSIS — E785 Hyperlipidemia, unspecified: Secondary | ICD-10-CM | POA: Diagnosis not present

## 2014-02-23 DIAGNOSIS — I10 Essential (primary) hypertension: Secondary | ICD-10-CM | POA: Diagnosis not present

## 2014-02-23 DIAGNOSIS — Z299 Encounter for prophylactic measures, unspecified: Secondary | ICD-10-CM

## 2014-02-23 DIAGNOSIS — R05 Cough: Secondary | ICD-10-CM | POA: Diagnosis not present

## 2014-02-23 DIAGNOSIS — Z418 Encounter for other procedures for purposes other than remedying health state: Secondary | ICD-10-CM

## 2014-02-23 LAB — HEPATIC FUNCTION PANEL
ALK PHOS: 73 U/L (ref 39–117)
ALT: 24 U/L (ref 0–35)
AST: 24 U/L (ref 0–37)
Albumin: 4 g/dL (ref 3.5–5.2)
Bilirubin, Direct: 0.1 mg/dL (ref 0.0–0.3)
Total Bilirubin: 0.7 mg/dL (ref 0.2–1.2)
Total Protein: 7.4 g/dL (ref 6.0–8.3)

## 2014-02-23 LAB — LIPID PANEL
CHOL/HDL RATIO: 5
Cholesterol: 189 mg/dL (ref 0–200)
HDL: 40.4 mg/dL (ref >39.00)
LDL Cholesterol: 135 mg/dL — ABNORMAL HIGH (ref 0–99)
NonHDL: 148.6
Triglycerides: 66 mg/dL (ref 0.0–149.0)
VLDL: 13.2 mg/dL (ref 0.0–40.0)

## 2014-02-23 LAB — URINALYSIS, ROUTINE W REFLEX MICROSCOPIC
BILIRUBIN URINE: NEGATIVE
HGB URINE DIPSTICK: NEGATIVE
KETONES UR: NEGATIVE
NITRITE: NEGATIVE
Specific Gravity, Urine: 1.01 (ref 1.000–1.030)
Total Protein, Urine: NEGATIVE
UROBILINOGEN UA: 0.2 (ref 0.0–1.0)
Urine Glucose: NEGATIVE
pH: 6.5 (ref 5.0–8.0)

## 2014-02-23 LAB — CBC WITH DIFFERENTIAL/PLATELET
BASOS ABS: 0 10*3/uL (ref 0.0–0.1)
BASOS PCT: 0.3 % (ref 0.0–3.0)
EOS ABS: 0.1 10*3/uL (ref 0.0–0.7)
Eosinophils Relative: 1.7 % (ref 0.0–5.0)
HCT: 40.6 % (ref 36.0–46.0)
HEMOGLOBIN: 12.9 g/dL (ref 12.0–15.0)
LYMPHS PCT: 24.1 % (ref 12.0–46.0)
Lymphs Abs: 1.8 10*3/uL (ref 0.7–4.0)
MCHC: 31.7 g/dL (ref 30.0–36.0)
MCV: 87.3 fl (ref 78.0–100.0)
MONOS PCT: 7.9 % (ref 3.0–12.0)
Monocytes Absolute: 0.6 10*3/uL (ref 0.1–1.0)
Neutro Abs: 4.8 10*3/uL (ref 1.4–7.7)
Neutrophils Relative %: 66 % (ref 43.0–77.0)
Platelets: 333 10*3/uL (ref 150.0–400.0)
RBC: 4.65 Mil/uL (ref 3.87–5.11)
RDW: 15.1 % (ref 11.5–15.5)
WBC: 7.3 10*3/uL (ref 4.0–10.5)

## 2014-02-23 LAB — BASIC METABOLIC PANEL
BUN: 12 mg/dL (ref 6–23)
CALCIUM: 9.4 mg/dL (ref 8.4–10.5)
CO2: 30 mEq/L (ref 19–32)
Chloride: 106 mEq/L (ref 96–112)
Creatinine, Ser: 0.8 mg/dL (ref 0.4–1.2)
GFR: 90.31 mL/min (ref >60.00)
Glucose, Bld: 95 mg/dL (ref 70–99)
Potassium: 3 mEq/L — ABNORMAL LOW (ref 3.5–5.1)
SODIUM: 143 meq/L (ref 135–145)

## 2014-02-23 MED ORDER — ZOLPIDEM TARTRATE ER 12.5 MG PO TBCR: 12.5000 mg | EXTENDED_RELEASE_TABLET | Freq: Every evening | ORAL | Status: DC | PRN

## 2014-02-23 MED ORDER — ALPRAZOLAM 0.5 MG PO TABS: 0.5000 mg | ORAL_TABLET | Freq: Two times a day (BID) | ORAL | Status: DC | PRN

## 2014-02-23 MED ORDER — BENZONATATE 100 MG PO CAPS: 100.0000 mg | ORAL_CAPSULE | Freq: Two times a day (BID) | ORAL | Status: DC | PRN

## 2014-02-23 MED ORDER — AMLODIPINE BESYLATE 10 MG PO TABS: 10.0000 mg | ORAL_TABLET | Freq: Every day | ORAL | Status: DC

## 2014-02-23 MED ORDER — ALBUTEROL SULFATE HFA 108 (90 BASE) MCG/ACT IN AERS: 2.0000 | INHALATION_SPRAY | Freq: Four times a day (QID) | RESPIRATORY_TRACT | Status: DC | PRN

## 2014-02-23 MED ORDER — VITAMIN D3 25 MCG (1000 UT) PO CAPS: 1000.0000 [IU] | ORAL_CAPSULE | Freq: Every day | ORAL | Status: AC

## 2014-02-23 MED ORDER — LOSARTAN POTASSIUM 50 MG PO TABS: 50.0000 mg | ORAL_TABLET | Freq: Every day | ORAL | Status: DC

## 2014-02-23 MED ORDER — VITAMIN B-12 1000 MCG PO TABS: 1000.0000 ug | ORAL_TABLET | Freq: Every day | ORAL | Status: DC

## 2014-02-23 NOTE — Progress Notes (Signed)
Pre visit review using our clinic review tool, if applicable. No additional management support is needed unless otherwise documented below in the visit note. 

## 2014-02-23 NOTE — Assessment & Plan Note (Signed)
reports compliance with prescribed statin since 12/2012 due to concerns of potential side effects, we changed atorvastatin to pravastatin 12/2012 Check lipid annually- titrate as needed The patient is asked to make an attempt to improve diet and exercise patterns to aid in medical management of this problem.
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
## 2014-02-23 NOTE — Assessment & Plan Note (Signed)
BP Readings from Last 3 Encounters:  02/23/14 134/78  10/15/13 133/83  08/25/13 132/82   The current medical regimen is effective;  continue present plan and medications.

## 2014-02-23 NOTE — Patient Instructions (Addendum)
It was good to see you today.  We have reviewed your prior records including labs and tests today  Health Maintenance reviewed - pneumonia vaccine and annual flu shot administered today. All other recommended immunizations and age-appropriate screenings are up-to-date.  Test(s) ordered today. Your results will be released to [place] (or called to you) after review, usually within 72hours after test completion. If any changes need to be made, you will be notified at that same time.  Medications reviewed and updated Use Tessalon at bedtime and daily as needed for cough, also albuterol inhaler as needed for cough symptoms Your prescription(s) have been submitted to your pharmacy. Please take as directed and contact our office if you believe you are having problem(s) with the medication(s).  Please schedule followup in 12 months for annual exam and labs, call sooner if problems.  Health Maintenance Adopting a healthy lifestyle and getting preventive care can go a long way to promote health and wellness. Talk with your health care provider about what schedule of regular examinations is right for you. This is a good chance for you to check in with your provider about disease prevention and staying healthy. In between checkups, there are plenty of things you can do on your own. Experts have done a lot of research about which lifestyle changes and preventive measures are most likely to keep you healthy. Ask your health care provider for more information. WEIGHT AND DIET  Eat a healthy diet  Be sure to include plenty of vegetables, fruits, low-fat dairy products, and lean protein.  Do not eat a lot of foods high in solid fats, added sugars, or salt.  Get regular exercise. This is one of the most important things you can do for your health.  Most adults should exercise for at least 150 minutes each week. The exercise should increase your heart rate and make you sweat (moderate-intensity  exercise).  Most adults should also do strengthening exercises at least twice a week. This is in addition to the moderate-intensity exercise.  Maintain a healthy weight  Body mass index (BMI) is a measurement that can be used to identify possible weight problems. It estimates body fat based on height and weight. Your health care provider can help determine your BMI and help you achieve or maintain a healthy weight.  For females 55 years of age and older:   A BMI below 18.5 is considered underweight.  A BMI of 18.5 to 24.9 is normal.  A BMI of 25 to 29.9 is considered overweight.  A BMI of 30 and above is considered obese.  Watch levels of cholesterol and blood lipids  You should start having your blood tested for lipids and cholesterol at 68 years of age, then have this test every 5 years.  You may need to have your cholesterol levels checked more often if:  Your lipid or cholesterol levels are high.  You are older than 68 years of age.  You are at high risk for heart disease.  CANCER SCREENING   Lung Cancer  Lung cancer screening is recommended for adults 52-4 years old who are at high risk for lung cancer because of a history of smoking.  A yearly low-dose CT scan of the lungs is recommended for people who:  Currently smoke.  Have quit within the past 15 years.  Have at least a 30-pack-year history of smoking. A pack year is smoking an average of one pack of cigarettes a day for 1 year.  Yearly screening  should continue until it has been 15 years since you quit.  Yearly screening should stop if you develop a health problem that would prevent you from having lung cancer treatment.  Breast Cancer  Practice breast self-awareness. This means understanding how your breasts normally appear and feel.  It also means doing regular breast self-exams. Let your health care provider know about any changes, no matter how small.  If you are in your 20s or 30s, you should  have a clinical breast exam (CBE) by a health care provider every 1-3 years as part of a regular health exam.  If you are 75 or older, have a CBE every year. Also consider having a breast X-ray (mammogram) every year.  If you have a family history of breast cancer, talk to your health care provider about genetic screening.  If you are at high risk for breast cancer, talk to your health care provider about having an MRI and a mammogram every year.  Breast cancer gene (BRCA) assessment is recommended for women who have family members with BRCA-related cancers. BRCA-related cancers include:  Breast.  Ovarian.  Tubal.  Peritoneal cancers.  Results of the assessment will determine the need for genetic counseling and BRCA1 and BRCA2 testing. Cervical Cancer Routine pelvic examinations to screen for cervical cancer are no longer recommended for nonpregnant women who are considered low risk for cancer of the pelvic organs (ovaries, uterus, and vagina) and who do not have symptoms. A pelvic examination may be necessary if you have symptoms including those associated with pelvic infections. Ask your health care provider if a screening pelvic exam is right for you.   The Pap test is the screening test for cervical cancer for women who are considered at risk.  If you had a hysterectomy for a problem that was not cancer or a condition that could lead to cancer, then you no longer need Pap tests.  If you are older than 65 years, and you have had normal Pap tests for the past 10 years, you no longer need to have Pap tests.  If you have had past treatment for cervical cancer or a condition that could lead to cancer, you need Pap tests and screening for cancer for at least 20 years after your treatment.  If you no longer get a Pap test, assess your risk factors if they change (such as having a new sexual partner). This can affect whether you should start being screened again.  Some women have medical  problems that increase their chance of getting cervical cancer. If this is the case for you, your health care provider may recommend more frequent screening and Pap tests.  The human papillomavirus (HPV) test is another test that may be used for cervical cancer screening. The HPV test looks for the virus that can cause cell changes in the cervix. The cells collected during the Pap test can be tested for HPV.  The HPV test can be used to screen women 87 years of age and older. Getting tested for HPV can extend the interval between normal Pap tests from three to five years.  An HPV test also should be used to screen women of any age who have unclear Pap test results.  After 68 years of age, women should have HPV testing as often as Pap tests.  Colorectal Cancer  This type of cancer can be detected and often prevented.  Routine colorectal cancer screening usually begins at 68 years of age and continues through 68  years of age.  Your health care provider may recommend screening at an earlier age if you have risk factors for colon cancer.  Your health care provider may also recommend using home test kits to check for hidden blood in the stool.  A small camera at the end of a tube can be used to examine your colon directly (sigmoidoscopy or colonoscopy). This is done to check for the earliest forms of colorectal cancer.  Routine screening usually begins at age 59.  Direct examination of the colon should be repeated every 5-10 years through 68 years of age. However, you may need to be screened more often if early forms of precancerous polyps or small growths are found. Skin Cancer  Check your skin from head to toe regularly.  Tell your health care provider about any new moles or changes in moles, especially if there is a change in a mole's shape or color.  Also tell your health care provider if you have a mole that is larger than the size of a pencil eraser.  Always use sunscreen. Apply  sunscreen liberally and repeatedly throughout the day.  Protect yourself by wearing long sleeves, pants, a wide-brimmed hat, and sunglasses whenever you are outside. HEART DISEASE, DIABETES, AND HIGH BLOOD PRESSURE   Have your blood pressure checked at least every 1-2 years. High blood pressure causes heart disease and increases the risk of stroke.  If you are between 35 years and 88 years old, ask your health care provider if you should take aspirin to prevent strokes.  Have regular diabetes screenings. This involves taking a blood sample to check your fasting blood sugar level.  If you are at a normal weight and have a low risk for diabetes, have this test once every three years after 68 years of age.  If you are overweight and have a high risk for diabetes, consider being tested at a younger age or more often. PREVENTING INFECTION  Hepatitis B  If you have a higher risk for hepatitis B, you should be screened for this virus. You are considered at high risk for hepatitis B if:  You were born in a country where hepatitis B is common. Ask your health care provider which countries are considered high risk.  Your parents were born in a high-risk country, and you have not been immunized against hepatitis B (hepatitis B vaccine).  You have HIV or AIDS.  You use needles to inject street drugs.  You live with someone who has hepatitis B.  You have had sex with someone who has hepatitis B.  You get hemodialysis treatment.  You take certain medicines for conditions, including cancer, organ transplantation, and autoimmune conditions. Hepatitis C  Blood testing is recommended for:  Everyone born from [birth year] through 1965.  Anyone with known risk factors for hepatitis C. Sexually transmitted infections (STIs)  You should be screened for sexually transmitted infections (STIs) including gonorrhea and chlamydia if:  You are sexually active and are younger than 68 years of age.  You are  older than 68 years of age and your health care provider tells you that you are at risk for this type of infection.  Your sexual activity has changed since you were last screened and you are at an increased risk for chlamydia or gonorrhea. Ask your health care provider if you are at risk.  If you do not have HIV, but are at risk, it may be recommended that you take a prescription medicine daily to prevent  HIV infection. This is called pre-exposure prophylaxis (PrEP). You are considered at risk if:  You are sexually active and do not regularly use condoms or know the HIV status of your partner(s).  You take drugs by injection.  You are sexually active with a partner who has HIV. Talk with your health care provider about whether you are at high risk of being infected with HIV. If you choose to begin PrEP, you should first be tested for HIV. You should then be tested every 3 months for as long as you are taking PrEP.  PREGNANCY   If you are premenopausal and you may become pregnant, ask your health care provider about preconception counseling.  If you may become pregnant, take 400 to 800 micrograms (mcg) of folic acid every day.  If you want to prevent pregnancy, talk to your health care provider about birth control (contraception). OSTEOPOROSIS AND MENOPAUSE   Osteoporosis is a disease in which the bones lose minerals and strength with aging. This can result in serious bone fractures. Your risk for osteoporosis can be identified using a bone density scan.  If you are 86 years of age or older, or if you are at risk for osteoporosis and fractures, ask your health care provider if you should be screened.  Ask your health care provider whether you should take a calcium or vitamin D supplement to lower your risk for osteoporosis.  Menopause may have certain physical symptoms and risks.  Hormone replacement therapy may reduce some of these symptoms and risks. Talk to your health care provider  about whether hormone replacement therapy is right for you.  HOME CARE INSTRUCTIONS   Schedule regular health, dental, and eye exams.  Stay current with your immunizations.   Do not use any tobacco products including cigarettes, chewing tobacco, or electronic cigarettes.  If you are pregnant, do not drink alcohol.  If you are breastfeeding, limit how much and how often you drink alcohol.  Limit alcohol intake to no more than 1 drink per day for nonpregnant women. One drink equals 12 ounces of beer, 5 ounces of wine, or 1 ounces of hard liquor.  Do not use street drugs.  Do not share needles.  Ask your health care provider for help if you need support or information about quitting drugs.  Tell your health care provider if you often feel depressed.  Tell your health care provider if you have ever been abused or do not feel safe at home. Document Released: 09/12/2010 Document Revised: 07/14/2013 Document Reviewed: 01/29/2013 [facility] Patient Information 2015 [place], [place]. This information is not intended to replace advice given to you by your health care provider. Make sure you discuss any questions you have with your health care provider.

## 2014-03-02 DIAGNOSIS — H25813 Combined forms of age-related cataract, bilateral: Secondary | ICD-10-CM | POA: Diagnosis not present

## 2014-03-02 DIAGNOSIS — H40023 Open angle with borderline findings, high risk, bilateral: Secondary | ICD-10-CM | POA: Diagnosis not present

## 2014-04-10 ENCOUNTER — Other Ambulatory Visit: Payer: Self-pay | Admitting: Internal Medicine

## 2014-05-06 ENCOUNTER — Telehealth: Payer: Self-pay | Admitting: *Deleted

## 2014-05-06 ENCOUNTER — Ambulatory Visit: Payer: 59 | Admitting: Internal Medicine

## 2014-05-06 NOTE — Telephone Encounter (Signed)
[place] Night - Client TELEPHONE ADVICE RECORD [facility] Patient Name: Shelley Hayes Gender: Female DOB: 16-May-1945 Age: 69 Y 59 M 21 D Return Phone Number: [phone] (Primary) Address: [address] City/State/Zip: [place] [address] Client [place] [facility] [place] Night - Client Client Site [place] - Night Physician [place] Type Call [place] Name [place] Phone Number [phone] Relationship To Patient Self Is this call to report lab results? No Call Type General Information Initial Comment Caller states, has an appt today at 9am, she needs to cancel the appt today due to weather, would like to reschedule General Information Type Appointment Nurse Assessment Guidelines Guideline Title Affirmed Question Affirmed Notes Nurse Date/Time (Eastern Time) Disp. Time Eilene Ghazi Time) Disposition Final User 05/06/2014 7:26:38 AM General Information Provided Yes Eather Colas After Care Instructions Given Call Event Type User Date / Time Description

## 2014-08-27 ENCOUNTER — Other Ambulatory Visit: Payer: Self-pay | Admitting: Internal Medicine

## 2014-09-04 DIAGNOSIS — H4011X2 Primary open-angle glaucoma, moderate stage: Secondary | ICD-10-CM | POA: Diagnosis not present

## 2014-09-25 ENCOUNTER — Other Ambulatory Visit: Payer: Self-pay | Admitting: Internal Medicine

## 2014-10-02 DIAGNOSIS — H4011X2 Primary open-angle glaucoma, moderate stage: Secondary | ICD-10-CM | POA: Diagnosis not present

## 2015-01-28 ENCOUNTER — Ambulatory Visit: Payer: Self-pay | Admitting: Internal Medicine

## 2015-02-26 ENCOUNTER — Ambulatory Visit (INDEPENDENT_AMBULATORY_CARE_PROVIDER_SITE_OTHER): Payer: Medicare Other | Admitting: Internal Medicine

## 2015-02-26 ENCOUNTER — Encounter: Payer: Self-pay | Admitting: Internal Medicine

## 2015-02-26 ENCOUNTER — Other Ambulatory Visit (INDEPENDENT_AMBULATORY_CARE_PROVIDER_SITE_OTHER): Payer: Medicare Other

## 2015-02-26 VITALS — BP 132/76 | HR 85 | Temp 98.7°F | Resp 16 | Ht <= 58 in | Wt 208.0 lb

## 2015-02-26 DIAGNOSIS — Z23 Encounter for immunization: Secondary | ICD-10-CM

## 2015-02-26 DIAGNOSIS — R3915 Urgency of urination: Secondary | ICD-10-CM | POA: Diagnosis not present

## 2015-02-26 DIAGNOSIS — I10 Essential (primary) hypertension: Secondary | ICD-10-CM

## 2015-02-26 DIAGNOSIS — Z0000 Encounter for general adult medical examination without abnormal findings: Secondary | ICD-10-CM

## 2015-02-26 DIAGNOSIS — Z780 Asymptomatic menopausal state: Secondary | ICD-10-CM

## 2015-02-26 DIAGNOSIS — E785 Hyperlipidemia, unspecified: Secondary | ICD-10-CM | POA: Diagnosis not present

## 2015-02-26 DIAGNOSIS — M25571 Pain in right ankle and joints of right foot: Secondary | ICD-10-CM

## 2015-02-26 DIAGNOSIS — K635 Polyp of colon: Secondary | ICD-10-CM

## 2015-02-26 DIAGNOSIS — Z0189 Encounter for other specified special examinations: Secondary | ICD-10-CM | POA: Diagnosis not present

## 2015-02-26 LAB — CBC WITH DIFFERENTIAL/PLATELET
BASOS ABS: 0 10*3/uL (ref 0.0–0.1)
Basophils Relative: 0.4 % (ref 0.0–3.0)
EOS ABS: 0.1 10*3/uL (ref 0.0–0.7)
Eosinophils Relative: 1.4 % (ref 0.0–5.0)
HEMATOCRIT: 40.2 % (ref 36.0–46.0)
HEMOGLOBIN: 13.1 g/dL (ref 12.0–15.0)
LYMPHS PCT: 22.1 % (ref 12.0–46.0)
Lymphs Abs: 1.9 10*3/uL (ref 0.7–4.0)
MCHC: 32.6 g/dL (ref 30.0–36.0)
MCV: 85.3 fl (ref 78.0–100.0)
MONO ABS: 0.8 10*3/uL (ref 0.1–1.0)
Monocytes Relative: 9.4 % (ref 3.0–12.0)
NEUTROS ABS: 5.6 10*3/uL (ref 1.4–7.7)
Neutrophils Relative %: 66.7 % (ref 43.0–77.0)
PLATELETS: 354 10*3/uL (ref 150.0–400.0)
RBC: 4.71 Mil/uL (ref 3.87–5.11)
RDW: 14.2 % (ref 11.5–15.5)
WBC: 8.4 10*3/uL (ref 4.0–10.5)

## 2015-02-26 LAB — COMPREHENSIVE METABOLIC PANEL
ALT: 17 U/L (ref 0–35)
AST: 24 U/L (ref 0–37)
Albumin: 4.2 g/dL (ref 3.5–5.2)
Alkaline Phosphatase: 86 U/L (ref 39–117)
BILIRUBIN TOTAL: 0.6 mg/dL (ref 0.2–1.2)
BUN: 14 mg/dL (ref 6–23)
CALCIUM: 9.8 mg/dL (ref 8.4–10.5)
CO2: 29 meq/L (ref 19–32)
CREATININE: 0.89 mg/dL (ref 0.40–1.20)
Chloride: 104 mEq/L (ref 96–112)
GFR: 80.77 mL/min (ref >60.00)
Glucose, Bld: 99 mg/dL (ref 70–99)
Potassium: 3.3 mEq/L — ABNORMAL LOW (ref 3.5–5.1)
SODIUM: 144 meq/L (ref 135–145)
Total Protein: 8.2 g/dL (ref 6.0–8.3)

## 2015-02-26 LAB — LIPID PANEL
CHOL/HDL RATIO: 4
Cholesterol: 176 mg/dL (ref 0–200)
HDL: 42.5 mg/dL (ref >39.00)
LDL CALC: 121 mg/dL — AB (ref 0–99)
NONHDL: 133.67
TRIGLYCERIDES: 62 mg/dL (ref 0.0–149.0)
VLDL: 12.4 mg/dL (ref 0.0–40.0)

## 2015-02-26 LAB — URINALYSIS, ROUTINE W REFLEX MICROSCOPIC
Bilirubin Urine: NEGATIVE
Hgb urine dipstick: NEGATIVE
Ketones, ur: NEGATIVE
Nitrite: NEGATIVE
PH: 6 (ref 5.0–8.0)
RBC / HPF: NONE SEEN (ref 0–?)
SPECIFIC GRAVITY, URINE: 1.02 (ref 1.000–1.030)
TOTAL PROTEIN, URINE-UPE24: NEGATIVE
URINE GLUCOSE: NEGATIVE
UROBILINOGEN UA: 0.2 (ref 0.0–1.0)

## 2015-02-26 LAB — TSH: TSH: 0.88 u[IU]/mL (ref 0.35–4.50)

## 2015-02-26 MED ORDER — AMLODIPINE BESYLATE 10 MG PO TABS: 5.0000 mg | ORAL_TABLET | Freq: Every day | ORAL | Status: DC

## 2015-02-26 NOTE — Assessment & Plan Note (Signed)
Taking pravastain 80 mg daily Will check lipid panel and cmp today

## 2015-02-26 NOTE — Assessment & Plan Note (Signed)
BP Readings from Last 3 Encounters:  02/23/14 134/78  10/15/13 133/83  08/25/13 132/82    bp well controlled Continue current medication regimen

## 2015-02-26 NOTE — Progress Notes (Signed)
Pre visit review using our clinic review tool, if applicable. No additional management support is needed unless otherwise documented below in the visit note. 

## 2015-02-26 NOTE — Patient Instructions (Addendum)
Shelley Hayes , Thank you for taking time to come for your Medicare Wellness Visit. I appreciate your ongoing commitment to your health goals. Please review the following plan we discussed and let me know if I can assist you in the future.   These are the goals we discussed: Goals    Work on weight loss      This is a list of the screening recommended for you and due dates:  Health Maintenance  Topic Date Due  .  Hepatitis C: One time screening is recommended by Center for Disease Control  (CDC) for  adults born from [birth year] through 1965.   [birth year]  . Shingles Vaccine  09/12/2005  . DEXA scan (bone density measurement)  09/13/2010  . Mammogram  12/28/2014  . Pneumonia vaccines (2 of 2 - PCV13) 02/24/2015  . Flu Shot  10/12/2015  . Colon Cancer Screening  03/13/2017  . Tetanus Vaccine  10/24/2021       Test(s) ordered today. Your results will be released to [facility] (or called to you) after review, usually within 72hours after test completion. If any changes need to be made, you will be notified at that same time.  All other Health Maintenance issues reviewed.   All recommended immunizations and age-appropriate screenings are up-to-date.  Flu vaccine administered today.   Medications reviewed and updated.  Will decrease the amlodipine to 5 mg daily.  Continue to monitor your BP at home.   A dexa scan and referral to GI were ordered.     Health Maintenance, Female Adopting a healthy lifestyle and getting preventive care can go a long way to promote health and wellness. Talk with your health care provider about what schedule of regular examinations is right for you. This is a good chance for you to check in with your provider about disease prevention and staying healthy. In between checkups, there are plenty of things you can do on your own. Experts have done a lot of research about which lifestyle changes and preventive measures are most likely to keep you healthy. Ask your health  care provider for more information. WEIGHT AND DIET  Eat a healthy diet  Be sure to include plenty of vegetables, fruits, low-fat dairy products, and lean protein.  Do not eat a lot of foods high in solid fats, added sugars, or salt.  Get regular exercise. This is one of the most important things you can do for your health.  Most adults should exercise for at least 150 minutes each week. The exercise should increase your heart rate and make you sweat (moderate-intensity exercise).  Most adults should also do strengthening exercises at least twice a week. This is in addition to the moderate-intensity exercise.  Maintain a healthy weight  Body mass index (BMI) is a measurement that can be used to identify possible weight problems. It estimates body fat based on height and weight. Your health care provider can help determine your BMI and help you achieve or maintain a healthy weight.  For females 71 years of age and older:   A BMI below 18.5 is considered underweight.  A BMI of 18.5 to 24.9 is normal.  A BMI of 25 to 29.9 is considered overweight.  A BMI of 30 and above is considered obese.  Watch levels of cholesterol and blood lipids  You should start having your blood tested for lipids and cholesterol at 69 years of age, then have this test every 5 years.  You may need to  have your cholesterol levels checked more often if:  Your lipid or cholesterol levels are high.  You are older than 69 years of age.  You are at high risk for heart disease.  CANCER SCREENING   Lung Cancer  Lung cancer screening is recommended for adults 11-65 years old who are at high risk for lung cancer because of a history of smoking.  A yearly low-dose CT scan of the lungs is recommended for people who:  Currently smoke.  Have quit within the past 15 years.  Have at least a 30-pack-year history of smoking. A pack year is smoking an average of one pack of cigarettes a day for 1  year.  Yearly screening should continue until it has been 15 years since you quit.  Yearly screening should stop if you develop a health problem that would prevent you from having lung cancer treatment.  Breast Cancer  Practice breast self-awareness. This means understanding how your breasts normally appear and feel.  It also means doing regular breast self-exams. Let your health care provider know about any changes, no matter how small.  If you are in your 20s or 30s, you should have a clinical breast exam (CBE) by a health care provider every 1-3 years as part of a regular health exam.  If you are 23 or older, have a CBE every year. Also consider having a breast X-ray (mammogram) every year.  If you have a family history of breast cancer, talk to your health care provider about genetic screening.  If you are at high risk for breast cancer, talk to your health care provider about having an MRI and a mammogram every year.  Breast cancer gene (BRCA) assessment is recommended for women who have family members with BRCA-related cancers. BRCA-related cancers include:  Breast.  Ovarian.  Tubal.  Peritoneal cancers.  Results of the assessment will determine the need for genetic counseling and BRCA1 and BRCA2 testing. Cervical Cancer Your health care provider may recommend that you be screened regularly for cancer of the pelvic organs (ovaries, uterus, and vagina). This screening involves a pelvic examination, including checking for microscopic changes to the surface of your cervix (Pap test). You may be encouraged to have this screening done every 3 years, beginning at age 56.  For women ages 49-65, health care providers may recommend pelvic exams and Pap testing every 3 years, or they may recommend the Pap and pelvic exam, combined with testing for human papilloma virus (HPV), every 5 years. Some types of HPV increase your risk of cervical cancer. Testing for HPV may also be done on  women of any age with unclear Pap test results.  Other health care providers may not recommend any screening for nonpregnant women who are considered low risk for pelvic cancer and who do not have symptoms. Ask your health care provider if a screening pelvic exam is right for you.  If you have had past treatment for cervical cancer or a condition that could lead to cancer, you need Pap tests and screening for cancer for at least 20 years after your treatment. If Pap tests have been discontinued, your risk factors (such as having a new sexual partner) need to be reassessed to determine if screening should resume. Some women have medical problems that increase the chance of getting cervical cancer. In these cases, your health care provider may recommend more frequent screening and Pap tests. Colorectal Cancer  This type of cancer can be detected and often prevented.  Routine colorectal cancer screening usually begins at 69 years of age and continues through 69 years of age.  Your health care provider may recommend screening at an earlier age if you have risk factors for colon cancer.  Your health care provider may also recommend using home test kits to check for hidden blood in the stool.  A small camera at the end of a tube can be used to examine your colon directly (sigmoidoscopy or colonoscopy). This is done to check for the earliest forms of colorectal cancer.  Routine screening usually begins at age 29.  Direct examination of the colon should be repeated every 5-10 years through 69 years of age. However, you may need to be screened more often if early forms of precancerous polyps or small growths are found. Skin Cancer  Check your skin from head to toe regularly.  Tell your health care provider about any new moles or changes in moles, especially if there is a change in a mole's shape or color.  Also tell your health care provider if you have a mole that is larger than the size of a  pencil eraser.  Always use sunscreen. Apply sunscreen liberally and repeatedly throughout the day.  Protect yourself by wearing long sleeves, pants, a wide-brimmed hat, and sunglasses whenever you are outside. HEART DISEASE, DIABETES, AND HIGH BLOOD PRESSURE   High blood pressure causes heart disease and increases the risk of stroke. High blood pressure is more likely to develop in:  People who have blood pressure in the high end of the normal range (130-139/85-89 mm Hg).  People who are overweight or obese.  People who are African American.  If you are 41-74 years of age, have your blood pressure checked every 3-5 years. If you are 17 years of age or older, have your blood pressure checked every year. You should have your blood pressure measured twice--once when you are at a hospital or clinic, and once when you are not at a hospital or clinic. Record the average of the two measurements. To check your blood pressure when you are not at a hospital or clinic, you can use:  An automated blood pressure machine at a pharmacy.  A home blood pressure monitor.  If you are between 64 years and 32 years old, ask your health care provider if you should take aspirin to prevent strokes.  Have regular diabetes screenings. This involves taking a blood sample to check your fasting blood sugar level.  If you are at a normal weight and have a low risk for diabetes, have this test once every three years after 69 years of age.  If you are overweight and have a high risk for diabetes, consider being tested at a younger age or more often. PREVENTING INFECTION  Hepatitis B  If you have a higher risk for hepatitis B, you should be screened for this virus. You are considered at high risk for hepatitis B if:  You were born in a country where hepatitis B is common. Ask your health care provider which countries are considered high risk.  Your parents were born in a high-risk country, and you have not been  immunized against hepatitis B (hepatitis B vaccine).  You have HIV or AIDS.  You use needles to inject street drugs.  You live with someone who has hepatitis B.  You have had sex with someone who has hepatitis B.  You get hemodialysis treatment.  You take certain medicines for conditions, including cancer, organ  transplantation, and autoimmune conditions. Hepatitis C  Blood testing is recommended for:  Everyone born from [birth year] through 1965.  Anyone with known risk factors for hepatitis C. Sexually transmitted infections (STIs)  You should be screened for sexually transmitted infections (STIs) including gonorrhea and chlamydia if:  You are sexually active and are younger than 69 years of age.  You are older than 69 years of age and your health care provider tells you that you are at risk for this type of infection.  Your sexual activity has changed since you were last screened and you are at an increased risk for chlamydia or gonorrhea. Ask your health care provider if you are at risk.  If you do not have HIV, but are at risk, it may be recommended that you take a prescription medicine daily to prevent HIV infection. This is called pre-exposure prophylaxis (PrEP). You are considered at risk if:  You are sexually active and do not regularly use condoms or know the HIV status of your partner(s).  You take drugs by injection.  You are sexually active with a partner who has HIV. Talk with your health care provider about whether you are at high risk of being infected with HIV. If you choose to begin PrEP, you should first be tested for HIV. You should then be tested every 3 months for as long as you are taking PrEP.  PREGNANCY   If you are premenopausal and you may become pregnant, ask your health care provider about preconception counseling.  If you may become pregnant, take 400 to 800 micrograms (mcg) of folic acid every day.  If you want to prevent pregnancy, talk to your  health care provider about birth control (contraception). OSTEOPOROSIS AND MENOPAUSE   Osteoporosis is a disease in which the bones lose minerals and strength with aging. This can result in serious bone fractures. Your risk for osteoporosis can be identified using a bone density scan.  If you are 32 years of age or older, or if you are at risk for osteoporosis and fractures, ask your health care provider if you should be screened.  Ask your health care provider whether you should take a calcium or vitamin D supplement to lower your risk for osteoporosis.  Menopause may have certain physical symptoms and risks.  Hormone replacement therapy may reduce some of these symptoms and risks. Talk to your health care provider about whether hormone replacement therapy is right for you.  HOME CARE INSTRUCTIONS   Schedule regular health, dental, and eye exams.  Stay current with your immunizations.   Do not use any tobacco products including cigarettes, chewing tobacco, or electronic cigarettes.  If you are pregnant, do not drink alcohol.  If you are breastfeeding, limit how much and how often you drink alcohol.  Limit alcohol intake to no more than 1 drink per day for nonpregnant women. One drink equals 12 ounces of beer, 5 ounces of wine, or 1 ounces of hard liquor.  Do not use street drugs.  Do not share needles.  Ask your health care provider for help if you need support or information about quitting drugs.  Tell your health care provider if you often feel depressed.  Tell your health care provider if you have ever been abused or do not feel safe at home.   This information is not intended to replace advice given to you by your health care provider. Make sure you discuss any questions you have with your health care provider.  Document Released: 09/12/2010 Document Revised: 03/20/2014 Document Reviewed: 01/29/2013 Elsevier Interactive Patient Education [employer].

## 2015-02-26 NOTE — Progress Notes (Signed)
Subjective:    Patient ID: Shelley Hayes, female    DOB: 04/17/1945, 69 y.o.   MRN: [id number]  HPI She is here to establish with a new pcp.    Here for medicare wellness.   After she walks her bp is very low [id number] systolically.  She checks her BP regularly and it is typically around 0000000 systolically.  She is taking her medication daily as prescribed. She walks every day. She is compliant with a low sodium diet.  She has some overall weakness.  This has been present for 6 months.   She denies a pattern to it, there is no relation to time of day, eating or certain foods. She denies any relation to her sleep.    She also notes a pressure to urinate.  Sometimes she only goes a drop and other times she urinates normally.  She denies dysuria.  She has some increased urination.  It has been going for 6 months and is intermittent.  She takes the xanax only as needed.  Her sleep is pretty good, but some nights she is just can't sleep and that is when she takes the Xanax. She was also on Ambien in the past, but has not used it for a while. Overall she thinks her sleep is okay.   I have personally reviewed and have noted 1.The patient's medical and social history 2.Their use of alcohol, tobacco or illicit drugs 3.Their current medications and supplements 4.The patient's functional ability including ADL's, fall risks, home safety risks and hearing or visual impairment. 5.Diet and physical activities 6.Evidence for depression or mood disorders  Are there smokers in your home (other than you)? No  Risk Factors Exercise: regularly, walking two miles daily Dietary issues discussed: fairly good  Cardiac risk factors: advanced age, hypertension, hyperlipidemia, and obesity  Depression Screen  Have you felt down, depressed or hopeless? No  Have you felt little interest or pleasure in doing things?  No Activities of Daily Living In your  present state of health, do you have any difficulty performing the following activities?:  Driving? Yes Managing money?  Yes Feeding yourself? Yes Getting from bed to chair? Yes Climbing a flight of stairs? Yes Preparing food and eating?: Yes Bathing or showering? Yes Getting dressed: Yes Getting to/using the toilet? Yes Moving around from place to place: Yes In the past year have you fallen or had a near fall?: no   Are you sexually active?  No   Hearing Difficulties:  Do you often ask people to speak up or repeat themselves? No Do you experience ringing or noises in your ears? No Do you have difficulty understanding soft or whispered voices? No Vision:              Any change in vision: no              Up to date with eye exam: yes Memory:  Do you feel that you have a problem with memory? No  Do you often misplace items? No  Do you feel safe at home?  Yes  Cognitive Testing  Alert, Orientated? Yes  Normal Appearance? Yes  Recall of three objects?  Yes  Can perform simple calculations? Yes  Displays appropriate judgment? Yes  Can read the correct time from a watch face? Yes   Advanced Directives have been discussed with the patient? Yes  Medications and allergies reviewed with patient and updated.  Patient Active Problem List   Diagnosis Date  Noted  . SCIATICA, LEFT 01/20/2010  . Hyperlipidemia 08/23/2009  . GLUCOMA 08/23/2009  . Essential hypertension 08/23/2009  . ALLERGIC RHINITIS 08/23/2009  . ARTHRITIS 08/23/2009  . KNEE PAIN 08/23/2009    Current Outpatient Prescriptions on File Prior to Visit  Medication Sig Dispense Refill  . albuterol (PROVENTIL HFA;VENTOLIN HFA) 108 (90 BASE) MCG/ACT inhaler Inhale 2 puffs into the lungs every 6 (six) hours as needed for wheezing or shortness of breath. 1 Inhaler 0  . ALPRAZolam (XANAX) 0.5 MG tablet Take 1 tablet (0.5 mg total) by mouth 2 (two) times daily as needed for anxiety or sleep. 60 tablet 1  . aspirin 81 MG  tablet Take 81 mg by mouth daily.      Marland Kitchen atorvastatin (LIPITOR) 40 MG tablet Take 1 tablet (40 mg total) by mouth daily. 90 tablet 3  . Cholecalciferol (VITAMIN D3) 1000 UNITS CAPS Take 1 capsule (1,000 Units total) by mouth daily. 30 capsule   . hydrochlorothiazide (MICROZIDE) 12.5 MG capsule Take 1 capsule (12.5 mg total) by mouth daily. 90 capsule 3  . latanoprost (XALATAN) 0.005 % ophthalmic solution Place 1 drop into both eyes at bedtime. 2.5 mL 1  . Multiple Vitamin (MULTIVITAMIN) tablet Take 1 tablet by mouth daily.      . pravastatin (PRAVACHOL) 80 MG tablet Take 1 tablet (80 mg total) by mouth daily. 90 tablet 3  . valACYclovir (VALTREX) 500 MG tablet Take 1 tablet (500 mg total) by mouth daily. 90 tablet 1  . vitamin B-12 (CYANOCOBALAMIN) 1000 MCG tablet Take 1 tablet (1,000 mcg total) by mouth daily.     No current facility-administered medications on file prior to visit.    Past Medical History  Diagnosis Date  . ARTHRITIS   . ALLERGIC RHINITIS   . GLUCOMA   . HYPERLIPIDEMIA   . HYPERTENSION   . SCIATICA, LEFT 11/2010    Past Surgical History  Procedure Laterality Date  . Tubal ligation      Social History   Social History  . Marital Status: Divorced    Spouse Name: N/A  . Number of Children: N/A  . Years of Education: N/A   Social History Main Topics  . Smoking status: Never Smoker   . Smokeless tobacco: None  . Alcohol Use: No  . Drug Use: No  . Sexual Activity: Not Asked   Other Topics Concern  . None   Social History Narrative   Divorced, lives alone- in [place] for 20yrs but moved "home" to [place] in 2008      Walking regularly, every morning    Review of Systems  Constitutional: Positive for fatigue. Negative for fever and chills.  HENT: Negative for hearing loss.   Eyes: Negative for visual disturbance.  Respiratory: Negative for cough, shortness of breath and wheezing.   Cardiovascular: Positive for leg swelling (ankle swelling with prolonged  sitting). Negative for chest pain and palpitations.  Gastrointestinal: Negative for nausea, abdominal pain, diarrhea and constipation.       No GERD  Genitourinary: Positive for urgency. Negative for dysuria and hematuria.  Musculoskeletal: Positive for back pain (intermittent - sciatica - prolonged standing). Negative for arthralgias.       Foot pain and swelling sometimes with walking  Neurological: Positive for weakness (generalized). Negative for dizziness, light-headedness, numbness and headaches.  Psychiatric/Behavioral: Negative for dysphoric mood. The patient is nervous/anxious (at times).        Objective:   Filed Vitals:   02/26/15 0808  BP: 132/76  Pulse: 85  Temp: 98.7 F (37.1 C)  Resp: 16   Filed Weights   02/26/15 0808  Weight: 208 lb (94.348 kg)   Body mass index is 45 kg/(m^2).   Physical Exam Constitutional: She appears well-developed and well-nourished. No distress.  HENT:  Head: Normocephalic and atraumatic.  Right Ear: External ear normal.  Left Ear: External ear normal.  Mouth/Throat: Oropharynx is clear and moist.  Normal bilateral ear canals and tympanic membranes  Eyes: Conjunctivae and EOM are normal.  Neck: Neck supple. No tracheal deviation present. No thyromegaly present.  No carotid bruit  Cardiovascular: Normal rate, regular rhythm and normal heart sounds.   No murmur heard. Pulmonary/Chest: Effort normal and breath sounds normal. No respiratory distress. She has no wheezes. She has no rales.  Abdominal: Soft. She exhibits no distension. There is no tenderness.  Musculoskeletal: She exhibits no edema.  Lymphadenopathy:    She has no cervical adenopathy.  Skin: Skin is warm and dry. She is not diaphoretic.  Psychiatric: She has a normal mood and affect. Her behavior is normal.         Assessment & Plan:    Wellness visit: Screening blood work ordered Colonoscopy not up to date - will refer Mammogram not up to date - will  schedule Gyn - she will schedule dexa - due -- ordered EKG done 2015 Eye exam up to date Immunizations - given flu vaccine, discussed shingles and prevnar Exercise - continue regular walking - will try to increase Encouraged weight loss-discussed that she will need to decrease her portions in order to lose weight and continue regular exercise See above for screenings-no concerns    See problem list for A/P of chronic medical problems  Will check UA, Ucx for urine symptoms   She is having some foot pain and swelling at times and wants to see a podiatrist - referred

## 2015-02-27 LAB — URINE CULTURE
Colony Count: NO GROWTH
ORGANISM ID, BACTERIA: NO GROWTH

## 2015-02-27 LAB — HEPATITIS C ANTIBODY: HCV AB: NEGATIVE

## 2015-02-28 ENCOUNTER — Encounter: Payer: Self-pay | Admitting: Internal Medicine

## 2015-02-28 MED ORDER — POTASSIUM CHLORIDE CRYS ER 20 MEQ PO TBCR: 20.0000 meq | EXTENDED_RELEASE_TABLET | Freq: Every day | ORAL | Status: DC

## 2015-04-13 ENCOUNTER — Encounter: Payer: Self-pay | Admitting: Internal Medicine

## 2015-05-07 ENCOUNTER — Other Ambulatory Visit: Payer: Self-pay | Admitting: Internal Medicine

## 2015-08-20 ENCOUNTER — Other Ambulatory Visit: Payer: Self-pay | Admitting: Internal Medicine

## 2015-11-20 ENCOUNTER — Other Ambulatory Visit: Payer: Self-pay | Admitting: Internal Medicine

## 2015-12-06 MED ORDER — ATORVASTATIN CALCIUM 40 MG PO TABS: 40.0000 mg | ORAL_TABLET | Freq: Every day | ORAL | 0 refills | Status: DC

## 2015-12-06 MED ORDER — HYDROCHLOROTHIAZIDE 12.5 MG PO CAPS: 12.5000 mg | ORAL_CAPSULE | Freq: Every day | ORAL | 0 refills | Status: DC

## 2015-12-06 NOTE — Telephone Encounter (Signed)
Pt states she saw Dr. Quay Burow in Dec for her CPX, nad not due back until this Dec. Her medication was denied and need refill sent for HCTZ & Atorvastatin. She has made CPX got 02/29/16 sent enough med until appt...Shelley Hayes

## 2015-12-22 ENCOUNTER — Telehealth: Payer: Self-pay

## 2015-12-22 NOTE — Telephone Encounter (Signed)
Pt contacted [place] and was sent to triage. Pt refused triage and [facility] contacted the back line.   I spoke to the pt once we were connected. Pt stated that she has sx of chest pain with palpitation and light headedness. She also states that she becomes hot and then cold and then hot again. I instructed the pt that she need to be seen sooner due to the sx and her risk factors. She refuses the ED and Urgent Care. I scheduled her with the first appt available tomorrow.  I instructed pt to call 911 if her sx become any worse or if there is any SOB and/or vomiting.

## 2015-12-23 ENCOUNTER — Other Ambulatory Visit (INDEPENDENT_AMBULATORY_CARE_PROVIDER_SITE_OTHER): Payer: Medicare Other

## 2015-12-23 ENCOUNTER — Ambulatory Visit (INDEPENDENT_AMBULATORY_CARE_PROVIDER_SITE_OTHER): Payer: Medicare Other | Admitting: Family

## 2015-12-23 ENCOUNTER — Encounter: Payer: Self-pay | Admitting: Family

## 2015-12-23 VITALS — BP 128/86 | HR 90 | Temp 98.6°F | Resp 16 | Ht <= 58 in | Wt 195.0 lb

## 2015-12-23 DIAGNOSIS — I498 Other specified cardiac arrhythmias: Secondary | ICD-10-CM | POA: Insufficient documentation

## 2015-12-23 DIAGNOSIS — R002 Palpitations: Secondary | ICD-10-CM

## 2015-12-23 DIAGNOSIS — M62838 Other muscle spasm: Secondary | ICD-10-CM | POA: Insufficient documentation

## 2015-12-23 HISTORY — DX: Other muscle spasm: M62.838

## 2015-12-23 HISTORY — DX: Other specified cardiac arrhythmias: I49.8

## 2015-12-23 LAB — BASIC METABOLIC PANEL
BUN: 11 mg/dL (ref 6–23)
CHLORIDE: 103 meq/L (ref 96–112)
CO2: 31 mEq/L (ref 19–32)
Calcium: 9.4 mg/dL (ref 8.4–10.5)
Creatinine, Ser: 0.8 mg/dL (ref 0.40–1.20)
GFR: 91.12 mL/min (ref >60.00)
Glucose, Bld: 94 mg/dL (ref 70–99)
POTASSIUM: 3.4 meq/L — AB (ref 3.5–5.1)
Sodium: 140 mEq/L (ref 135–145)

## 2015-12-23 LAB — MAGNESIUM: Magnesium: 2.1 mg/dL (ref 1.5–2.5)

## 2015-12-23 NOTE — Progress Notes (Signed)
Subjective:    Patient ID: Shelley Hayes, female    DOB: 03-20-1945, 70 y.o.   MRN: [id number]  Chief Complaint  Patient presents with  . Palpitations    has been having heart palpitations for a while, says she does not consider herself having chest pain, did have an episode of blurry vision, neck pain     HPI:  Shelley Hayes is a 70 y.o. female who  has a past medical history of ALLERGIC RHINITIS; ARTHRITIS; GLUCOMA; HYPERLIPIDEMIA; HYPERTENSION; and SCIATICA, LEFT (11/2010). and presents today for an office visit.   1.) Palpitations - This is a new problem. Associated symptoms of heart palpitations that has been going on for about a month. Symptoms generally last for a few seconds. Describes that she gets short of breath and that her breath will get cut off. Denies chest pain. Most recent episodes occurred yesterday. Frequency of episodes are daily and sometimes multiple times throughout the day. Does describe some lightheadedness and blurred vision. Denies any work-up or EKG.   2.) Neck pain - This is a new problem. Associated symptom of neck pain has been going on for about 2 weeks and is located primarily on the left side and is aggravated by turning her head. Denies any trauma or injury. Pain is described as achy with occasional radiation down her left upper extremity. Modifying factors include Beng[place] which did not help very much.   Allergies  Allergen Reactions  . Hydrocodone-Homatropine Other (See Comments)    "don't give me that stuff - i'll get hooked!"  . Losartan Other (See Comments)    palpitations      Outpatient Medications Prior to Visit  Medication Sig Dispense Refill  . albuterol (PROVENTIL HFA;VENTOLIN HFA) 108 (90 BASE) MCG/ACT inhaler Inhale 2 puffs into the lungs every 6 (six) hours as needed for wheezing or shortness of breath. 1 Inhaler 0  . ALPRAZolam (XANAX) 0.5 MG tablet Take 1 tablet (0.5 mg total) by mouth 2 (two) times daily as needed for anxiety or sleep.  60 tablet 1  . amLODipine (NORVASC) 10 MG tablet Take 0.5 tablets (5 mg total) by mouth daily. 90 tablet 3  . amLODipine (NORVASC) 10 MG tablet TAKE ONE TABLET BY MOUTH ONCE DAILY 90 tablet 3  . aspirin 81 MG tablet Take 81 mg by mouth daily.      Marland Kitchen atorvastatin (LIPITOR) 40 MG tablet Take 1 tablet (40 mg total) by mouth daily. Keep Dec appt for future refills 90 tablet 0  . Cholecalciferol (VITAMIN D3) 1000 UNITS CAPS Take 1 capsule (1,000 Units total) by mouth daily. 30 capsule   . hydrochlorothiazide (MICROZIDE) 12.5 MG capsule Take 1 capsule (12.5 mg total) by mouth daily. Keep Dec appt for future refills 90 capsule 0  . latanoprost (XALATAN) 0.005 % ophthalmic solution Place 1 drop into both eyes at bedtime. 2.5 mL 1  . losartan (COZAAR) 50 MG tablet Take 1 tablet (50 mg total) by mouth daily. 90 tablet 0  . Multiple Vitamin (MULTIVITAMIN) tablet Take 1 tablet by mouth daily.      . potassium chloride SA (K-DUR,KLOR-CON) 20 MEQ tablet Take 1 tablet (20 mEq total) by mouth daily. 90 tablet 3  . pravastatin (PRAVACHOL) 80 MG tablet Take 1 tablet (80 mg total) by mouth daily. 90 tablet 3  . valACYclovir (VALTREX) 500 MG tablet Take 1 tablet (500 mg total) by mouth daily. 90 tablet 1  . vitamin B-12 (CYANOCOBALAMIN) 1000 MCG tablet Take 1  tablet (1,000 mcg total) by mouth daily.     No facility-administered medications prior to visit.       Past Surgical History:  Procedure Laterality Date  . TUBAL LIGATION        Past Medical History:  Diagnosis Date  . ALLERGIC RHINITIS   . ARTHRITIS   . GLUCOMA   . HYPERLIPIDEMIA   . HYPERTENSION   . SCIATICA, LEFT 11/2010      Review of Systems  Constitutional: Negative for chills and fever.  Respiratory: Negative for chest tightness and shortness of breath.   Cardiovascular: Positive for palpitations. Negative for chest pain and leg swelling.  Musculoskeletal: Positive for neck pain.  Neurological: Negative for weakness and numbness.        Objective:    BP 128/86 (BP Location: Left Arm, Patient Position: Sitting, Cuff Size: Large)   Pulse 90   Temp 98.6 F (37 C) (Oral)   Resp 16   Ht 4\' 9"  (1.448 m)   Wt 195 lb (88.5 kg)   SpO2 98%   BMI 42.20 kg/m  Nursing note and vital signs reviewed.  Physical Exam  Constitutional: She is oriented to person, place, and time. She appears well-developed and well-nourished. No distress.  Neck:  No obvious deformity, discoloration, or edema. Mild tenderness of the left upper trapezius with mild muscle spasm and no deformity or deformity. Range of motion is severely restricted in lateral bending and rotation. Strength is normal. Distal pulses and sensation are intact and appropriate. Negative cervical compression.  Cardiovascular: Normal rate, regular rhythm, normal heart sounds and intact distal pulses.   Pulmonary/Chest: Effort normal and breath sounds normal.  Neurological: She is alert and oriented to person, place, and time.  Skin: Skin is warm and dry.  Psychiatric: She has a normal mood and affect. Her behavior is normal. Judgment and thought content normal.       Assessment & Plan:   Problem List Items Addressed This Visit      Other   Palpitations - Primary    In office EKG shows normal sinus rhythm with no ST segment elevation or other concern for ACS. Cardiac exam is otherwise benign. Cannot rule out gastroesophageal reflux or possible anxiety. Obtain Holter monitor to capture symptoms. Consider additional imaging through echocardiogram if indicated. Continue to monitor.      Relevant Orders   EKG 12-Lead (Completed)   Holter monitor - 48 hour   Basic Metabolic Panel (BMET) (Completed)   Magnesium (Completed)   Cervical paraspinal muscle spasm    Symptoms and exam consistent with cervical muscle spasms from a nontraumatic origin. Treat conservatively with ice/moist heat, home exercise therapy, and over-the-counter medications as needed for symptom relief and  supportive care. Follow-up if symptoms worsen or fail to improve for further imaging or physical therapy if indicated.       Other Visit Diagnoses   None.      I am having Ms. [name] maintain her aspirin, multivitamin, valACYclovir, latanoprost, pravastatin, ALPRAZolam, vitamin B-12, Vitamin D3, albuterol, amLODipine, potassium chloride SA, losartan, amLODipine, atorvastatin, and hydrochlorothiazide.   Follow-up: Return if symptoms worsen or fail to improve.  Mauricio Po, FNP

## 2015-12-23 NOTE — Assessment & Plan Note (Signed)
Symptoms and exam consistent with cervical muscle spasms from a nontraumatic origin. Treat conservatively with ice/moist heat, home exercise therapy, and over-the-counter medications as needed for symptom relief and supportive care. Follow-up if symptoms worsen or fail to improve for further imaging or physical therapy if indicated.

## 2015-12-23 NOTE — Assessment & Plan Note (Signed)
In office EKG shows normal sinus rhythm with no ST segment elevation or other concern for ACS. Cardiac exam is otherwise benign. Cannot rule out gastroesophageal reflux or possible anxiety. Obtain Holter monitor to capture symptoms. Consider additional imaging through echocardiogram if indicated. Continue to monitor.

## 2015-12-23 NOTE — Patient Instructions (Signed)
Thank you for choosing [facility].  SUMMARY AND INSTRUCTIONS:  Ice/moist heat 20 minutes every 2 hours as needed.  Stretches and exercises multiple times throughout the day.  Over-the-counter medications as needed for symptom relief.  They will call to schedule your appointment for the Holter monitor.  May try a reflux medication to see if that helps with her symptoms.   Labs:  Please stop by the lab on the lower level of the building for your blood work. Your results will be released to [place] (or called to you) after review, usually within 72 hours after test completion. If any changes need to be made, you will be notified at that same time.  1.) The lab is open from 7:30am to 5:30 pm Monday-Friday 2.) No appointment is necessary 3.) Fasting (if needed) is 6-8 hours after food and drink; black coffee and water are okay    Follow up:  If your symptoms worsen or fail to improve, please contact our office for further instruction, or in case of emergency go directly to the emergency room at the closest medical facility.     Cervical Strain and Sprain With Rehab Cervical strain and sprain are injuries that commonly occur with "whiplash" injuries. Whiplash occurs when the neck is forcefully whipped backward or forward, such as during a motor vehicle accident or during contact sports. The muscles, ligaments, tendons, discs, and nerves of the neck are susceptible to injury when this occurs. RISK FACTORS Risk of having a whiplash injury increases if:  Osteoarthritis of the spine.  Situations that make head or neck accidents or trauma more likely.  High-risk sports (football, rugby, wrestling, hockey, auto racing, gymnastics, diving, contact karate, or boxing).  Poor strength and flexibility of the neck.  Previous neck injury.  Poor tackling technique.  Improperly fitted or padded equipment. SYMPTOMS   Pain or stiffness in the front or back of neck or  both.  Symptoms may present immediately or up to 24 hours after injury.  Dizziness, headache, nausea, and vomiting.  Muscle spasm with soreness and stiffness in the neck.  Tenderness and swelling at the injury site. PREVENTION  Learn and use proper technique (avoid tackling with the head, spearing, and head-butting; use proper falling techniques to avoid landing on the head).  Warm up and stretch properly before activity.  Maintain physical fitness:  Strength, flexibility, and endurance.  Cardiovascular fitness.  Wear properly fitted and padded protective equipment, such as padded soft collars, for participation in contact sports. PROGNOSIS  Recovery from cervical strain and sprain injuries is dependent on the extent of the injury. These injuries are usually curable in 1 week to 3 months with appropriate treatment.  RELATED COMPLICATIONS   Temporary numbness and weakness may occur if the nerve roots are damaged, and this may persist until the nerve has completely healed.  Chronic pain due to frequent recurrence of symptoms.  Prolonged healing, especially if activity is resumed too soon (before complete recovery). TREATMENT  Treatment initially involves the use of ice and medication to help reduce pain and inflammation. It is also important to perform strengthening and stretching exercises and modify activities that worsen symptoms so the injury does not get worse. These exercises may be performed at home or with a therapist. For patients who experience severe symptoms, a soft, padded collar may be recommended to be worn around the neck.  Improving your posture may help reduce symptoms. Posture improvement includes pulling your chin and abdomen in while sitting or standing.  If you are sitting, sit in a firm chair with your buttocks against the back of the chair. While sleeping, try replacing your pillow with a small towel rolled to 2 inches in diameter, or use a cervical pillow or  soft cervical collar. Poor sleeping positions delay healing.  For patients with nerve root damage, which causes numbness or weakness, the use of a cervical traction apparatus may be recommended. Surgery is rarely necessary for these injuries. However, cervical strain and sprains that are present at birth (congenital) may require surgery. MEDICATION   If pain medication is necessary, nonsteroidal anti-inflammatory medications, such as aspirin and ibuprofen, or other minor pain relievers, such as acetaminophen, are often recommended.  Do not take pain medication for 7 days before surgery.  Prescription pain relievers may be given if deemed necessary by your caregiver. Use only as directed and only as much as you need. HEAT AND COLD:   Cold treatment (icing) relieves pain and reduces inflammation. Cold treatment should be applied for 10 to 15 minutes every 2 to 3 hours for inflammation and pain and immediately after any activity that aggravates your symptoms. Use ice packs or an ice massage.  Heat treatment may be used prior to performing the stretching and strengthening activities prescribed by your caregiver, physical therapist, or athletic trainer. Use a heat pack or a warm soak. SEEK MEDICAL CARE IF:   Symptoms get worse or do not improve in 2 weeks despite treatment.  New, unexplained symptoms develop (drugs used in treatment may produce side effects). EXERCISES RANGE OF MOTION (ROM) AND STRETCHING EXERCISES - Cervical Strain and Sprain These exercises may help you when beginning to rehabilitate your injury. In order to successfully resolve your symptoms, you must improve your posture. These exercises are designed to help reduce the forward-head and rounded-shoulder posture which contributes to this condition. Your symptoms may resolve with or without further involvement from your physician, physical therapist or athletic trainer. While completing these exercises, remember:   Restoring  tissue flexibility helps normal motion to return to the joints. This allows healthier, less painful movement and activity.  An effective stretch should be held for at least 20 seconds, although you may need to begin with shorter hold times for comfort.  A stretch should never be painful. You should only feel a gentle lengthening or release in the stretched tissue. STRETCH- Axial Extensors  Lie on your back on the floor. You may bend your knees for comfort. Place a rolled-up hand towel or dish towel, about 2 inches in diameter, under the part of your head that makes contact with the floor.  Gently tuck your chin, as if trying to make a "double chin," until you feel a gentle stretch at the base of your head.  Hold __________ seconds. Repeat __________ times. Complete this exercise __________ times per day.  STRETCH - Axial Extension   Stand or sit on a firm surface. Assume a good posture: chest up, shoulders drawn back, abdominal muscles slightly tense, knees unlocked (if standing) and feet hip width apart.  Slowly retract your chin so your head slides back and your chin slightly lowers. Continue to look straight ahead.  You should feel a gentle stretch in the back of your head. Be certain not to feel an aggressive stretch since this can cause headaches later.  Hold for __________ seconds. Repeat __________ times. Complete this exercise __________ times per day. STRETCH - Cervical Side Bend   Stand or sit on a firm surface. Assume  a good posture: chest up, shoulders drawn back, abdominal muscles slightly tense, knees unlocked (if standing) and feet hip width apart.  Without letting your nose or shoulders move, slowly tip your right / left ear to your shoulder until your feel a gentle stretch in the muscles on the opposite side of your neck.  Hold __________ seconds. Repeat __________ times. Complete this exercise __________ times per day. STRETCH - Cervical Rotators   Stand or sit on a  firm surface. Assume a good posture: chest up, shoulders drawn back, abdominal muscles slightly tense, knees unlocked (if standing) and feet hip width apart.  Keeping your eyes level with the ground, slowly turn your head until you feel a gentle stretch along the back and opposite side of your neck.  Hold __________ seconds. Repeat __________ times. Complete this exercise __________ times per day. RANGE OF MOTION - Neck Circles   Stand or sit on a firm surface. Assume a good posture: chest up, shoulders drawn back, abdominal muscles slightly tense, knees unlocked (if standing) and feet hip width apart.  Gently roll your head down and around from the back of one shoulder to the back of the other. The motion should never be forced or painful.  Repeat the motion 10-20 times, or until you feel the neck muscles relax and loosen. Repeat __________ times. Complete the exercise __________ times per day. STRENGTHENING EXERCISES - Cervical Strain and Sprain These exercises may help you when beginning to rehabilitate your injury. They may resolve your symptoms with or without further involvement from your physician, physical therapist, or athletic trainer. While completing these exercises, remember:   Muscles can gain both the endurance and the strength needed for everyday activities through controlled exercises.  Complete these exercises as instructed by your physician, physical therapist, or athletic trainer. Progress the resistance and repetitions only as guided.  You may experience muscle soreness or fatigue, but the pain or discomfort you are trying to eliminate should never worsen during these exercises. If this pain does worsen, stop and make certain you are following the directions exactly. If the pain is still present after adjustments, discontinue the exercise until you can discuss the trouble with your clinician. STRENGTH - Cervical Flexors, Isometric  Face a wall, standing about 6 inches  away. Place a small pillow, a ball about 6-8 inches in diameter, or a folded towel between your forehead and the wall.  Slightly tuck your chin and gently push your forehead into the soft object. Push only with mild to moderate intensity, building up tension gradually. Keep your jaw and forehead relaxed.  Hold 10 to 20 seconds. Keep your breathing relaxed.  Release the tension slowly. Relax your neck muscles completely before you start the next repetition. Repeat __________ times. Complete this exercise __________ times per day. STRENGTH- Cervical Lateral Flexors, Isometric   Stand about 6 inches away from a wall. Place a small pillow, a ball about 6-8 inches in diameter, or a folded towel between the side of your head and the wall.  Slightly tuck your chin and gently tilt your head into the soft object. Push only with mild to moderate intensity, building up tension gradually. Keep your jaw and forehead relaxed.  Hold 10 to 20 seconds. Keep your breathing relaxed.  Release the tension slowly. Relax your neck muscles completely before you start the next repetition. Repeat __________ times. Complete this exercise __________ times per day. STRENGTH - Cervical Extensors, Isometric   Stand about 6 inches away from a  wall. Place a small pillow, a ball about 6-8 inches in diameter, or a folded towel between the back of your head and the wall.  Slightly tuck your chin and gently tilt your head back into the soft object. Push only with mild to moderate intensity, building up tension gradually. Keep your jaw and forehead relaxed.  Hold 10 to 20 seconds. Keep your breathing relaxed.  Release the tension slowly. Relax your neck muscles completely before you start the next repetition. Repeat __________ times. Complete this exercise __________ times per day. POSTURE AND BODY MECHANICS CONSIDERATIONS - Cervical Strain and Sprain Keeping correct posture when sitting, standing or completing your  activities will reduce the stress put on different body tissues, allowing injured tissues a chance to heal and limiting painful experiences. The following are general guidelines for improved posture. Your physician or physical therapist will provide you with any instructions specific to your needs. While reading these guidelines, remember:  The exercises prescribed by your provider will help you have the flexibility and strength to maintain correct postures.  The correct posture provides the optimal environment for your joints to work. All of your joints have less wear and tear when properly supported by a spine with good posture. This means you will experience a healthier, less painful body.  Correct posture must be practiced with all of your activities, especially prolonged sitting and standing. Correct posture is as important when doing repetitive low-stress activities (typing) as it is when doing a single heavy-load activity (lifting). PROLONGED STANDING WHILE SLIGHTLY LEANING FORWARD When completing a task that requires you to lean forward while standing in one place for a long time, place either foot up on a stationary 2- to 4-inch high object to help maintain the best posture. When both feet are on the ground, the low back tends to lose its slight inward curve. If this curve flattens (or becomes too large), then the back and your other joints will experience too much stress, fatigue more quickly, and can cause pain.  RESTING POSITIONS Consider which positions are most painful for you when choosing a resting position. If you have pain with flexion-based activities (sitting, bending, stooping, squatting), choose a position that allows you to rest in a less flexed posture. You would want to avoid curling into a fetal position on your side. If your pain worsens with extension-based activities (prolonged standing, working overhead), avoid resting in an extended position such as sleeping on your stomach.  Most people will find more comfort when they rest with their spine in a more neutral position, neither too rounded nor too arched. Lying on a non-sagging bed on your side with a pillow between your knees, or on your back with a pillow under your knees will often provide some relief. Keep in mind, being in any one position for a prolonged period of time, no matter how correct your posture, can still lead to stiffness. WALKING Walk with an upright posture. Your ears, shoulders, and hips should all line up. OFFICE WORK When working at a desk, create an environment that supports good, upright posture. Without extra support, muscles fatigue and lead to excessive strain on joints and other tissues. CHAIR:  A chair should be able to slide under your desk when your back makes contact with the back of the chair. This allows you to work closely.  The chair's height should allow your eyes to be level with the upper part of your monitor and your hands to be slightly lower than  your elbows.  Body position:  Your feet should make contact with the floor. If this is not possible, use a foot rest.  Keep your ears over your shoulders. This will reduce stress on your neck and low back.   This information is not intended to replace advice given to you by your health care provider. Make sure you discuss any questions you have with your health care provider.   Document Released: 02/27/2005 Document Revised: 03/20/2014 Document Reviewed: 06/11/2008 Elsevier Interactive Patient Education [employer].

## 2016-02-21 ENCOUNTER — Other Ambulatory Visit: Payer: Self-pay | Admitting: Internal Medicine

## 2016-02-22 DIAGNOSIS — Z124 Encounter for screening for malignant neoplasm of cervix: Secondary | ICD-10-CM | POA: Diagnosis not present

## 2016-02-22 DIAGNOSIS — Z1231 Encounter for screening mammogram for malignant neoplasm of breast: Secondary | ICD-10-CM | POA: Diagnosis not present

## 2016-02-22 DIAGNOSIS — R3915 Urgency of urination: Secondary | ICD-10-CM | POA: Diagnosis not present

## 2016-02-22 DIAGNOSIS — Z6839 Body mass index (BMI) 39.0-39.9, adult: Secondary | ICD-10-CM | POA: Diagnosis not present

## 2016-02-22 DIAGNOSIS — N390 Urinary tract infection, site not specified: Secondary | ICD-10-CM | POA: Diagnosis not present

## 2016-02-24 ENCOUNTER — Other Ambulatory Visit: Payer: Self-pay | Admitting: Obstetrics and Gynecology

## 2016-02-24 DIAGNOSIS — N644 Mastodynia: Secondary | ICD-10-CM

## 2016-02-28 NOTE — Progress Notes (Signed)
Pre visit review using our clinic review tool, if applicable. No additional management support is needed unless otherwise documented below in the visit note. 

## 2016-02-28 NOTE — Progress Notes (Signed)
Subjective:   Shelley Hayes is a 70 y.o. female who presents for Medicare Annual (Subsequent) preventive examination.  The Patient was informed that the wellness visit is to identify future health risk and educate and initiate measures that can reduce risk for increased disease through the lifespan.   Describes health as fair, good or great?   Review of Systems:  No ROS.  Medicare Wellness Visit.    Sleep patterns:  Home Safety/Smoke Alarms:   Living environment; residence and Firearm Safety:  Seat Belt Safety/Bike Helmet:   Counseling:   Eye Exam-  Dental-  Female:   Pap-N/A    Mammo-12/27/2012. Scheduled for repeat and [place] (left) in January 2018.        Dexa scan-Ordered 02/2015.       [facility]-Colonoscopy 03/14/2007, polyp (in [place])     Objective:     Vitals: There were no vitals taken for this visit.  There is no height or weight on file to calculate BMI.   Tobacco History  Smoking Status  . Never Smoker  Smokeless Tobacco  . Not on file     Counseling given: Not Answered   Past Medical History:  Diagnosis Date  . ALLERGIC RHINITIS   . ARTHRITIS   . GLUCOMA   . HYPERLIPIDEMIA   . HYPERTENSION   . SCIATICA, LEFT 11/2010   Past Surgical History:  Procedure Laterality Date  . TUBAL LIGATION     Family History  Problem Relation Age of Onset  . Arthritis Mother   . Diabetes Mother   . Colon cancer Mother 89  . Arthritis Father   . Alcohol abuse Brother   . Lung cancer Brother   . Prostate cancer Brother   . Diabetes Brother   . Hypertension Brother   . Lung cancer Other     Neice   History  Sexual Activity  . Sexual activity: Not on file    Outpatient Encounter Prescriptions as of 02/29/2016  Medication Sig  . albuterol (PROVENTIL HFA;VENTOLIN HFA) 108 (90 BASE) MCG/ACT inhaler Inhale 2 puffs into the lungs every 6 (six) hours as needed for wheezing or shortness of breath.  . ALPRAZolam (XANAX) 0.5 MG tablet Take 1 tablet (0.5 mg total) by  mouth 2 (two) times daily as needed for anxiety or sleep.  Marland Kitchen amLODipine (NORVASC) 10 MG tablet Take 0.5 tablets (5 mg total) by mouth daily.  Marland Kitchen amLODipine (NORVASC) 10 MG tablet TAKE ONE TABLET BY MOUTH ONCE DAILY  . aspirin 81 MG tablet Take 81 mg by mouth daily.    Marland Kitchen atorvastatin (LIPITOR) 40 MG tablet TAKE ONE TABLET BY MOUTH ONCE DAILY  . Cholecalciferol (VITAMIN D3) 1000 UNITS CAPS Take 1 capsule (1,000 Units total) by mouth daily.  . hydrochlorothiazide (MICROZIDE) 12.5 MG capsule TAKE ONE CAPSULE BY MOUTH ONCE DAILY  . latanoprost (XALATAN) 0.005 % ophthalmic solution Place 1 drop into both eyes at bedtime.  Marland Kitchen losartan (COZAAR) 50 MG tablet Take 1 tablet (50 mg total) by mouth daily.  . Multiple Vitamin (MULTIVITAMIN) tablet Take 1 tablet by mouth daily.    . potassium chloride SA (K-DUR,KLOR-CON) 20 MEQ tablet Take 1 tablet (20 mEq total) by mouth daily.  . pravastatin (PRAVACHOL) 80 MG tablet Take 1 tablet (80 mg total) by mouth daily.  . valACYclovir (VALTREX) 500 MG tablet Take 1 tablet (500 mg total) by mouth daily.  . vitamin B-12 (CYANOCOBALAMIN) 1000 MCG tablet Take 1 tablet (1,000 mcg total) by mouth daily.  No facility-administered encounter medications on file as of 02/29/2016.     Activities of Daily Living No flowsheet data found.  Patient Care Team: Binnie Rail, MD as PCP - General (Internal Medicine) Lavell Anchors, MD (Ophthalmology) Everlene Farrier, MD (Obstetrics and Gynecology)    Assessment:    Physical assessment deferred to PCP.  Exercise Activities and Dietary recommendations   Diet (meal preparation, eat out, water intake, caffeinated beverages, dairy products, fruits and vegetables):   Breakfast: Lunch:  Dinner:      Goals    None     Fall Risk Fall Risk  02/23/2014  Falls in the past year? No   Depression Screen PHQ 2/9 Scores 02/23/2014 10/25/2011  PHQ - 2 Score 2 0  PHQ- 9 Score 4 -     Cognitive Function        Immunization  History  Administered Date(s) Administered  . Influenza Whole 01/20/2010  . Influenza, High Dose Seasonal PF 12/30/2012, 02/26/2015  . Influenza,inj,Quad PF,36+ Mos 02/23/2014  . Pneumococcal Polysaccharide-23 02/23/2014  . Tetanus 10/25/2011   Screening Tests Health Maintenance  Topic Date Due  . ZOSTAVAX  09/12/2005  . DEXA SCAN  09/13/2010  . MAMMOGRAM  12/28/2014  . PNA vac Low Risk Adult (2 of 2 - PCV13) 02/24/2015  . INFLUENZA VACCINE  10/12/2015  . COLONOSCOPY  03/13/2017  . TETANUS/TDAP  10/24/2021  . Hepatitis C Screening  Completed      Plan:     Continue to eat heart healthy diet (full of fruits, vegetables, whole grains, lean protein, water--limit salt, fat, and sugar intake) and increase physical activity as tolerated.  Continue doing brain stimulating activities (puzzles, reading, adult coloring books, staying active) to keep memory sharp.   During the course of the visit the patient was educated and counseled about the following appropriate screening and preventive services:   Vaccines to include Pneumoccal, Influenza, Hepatitis B, Td, Zostavax, HCV  Cardiovascular Disease  Colorectal cancer screening  Bone density screening  Diabetes screening  Glaucoma screening  Mammography/PAP  Nutrition counseling   Patient Instructions (the written plan) was given to the patient.   Gerilyn Nestle, RN  02/28/2016

## 2016-02-29 ENCOUNTER — Ambulatory Visit (INDEPENDENT_AMBULATORY_CARE_PROVIDER_SITE_OTHER): Payer: Medicare Other

## 2016-02-29 ENCOUNTER — Encounter: Payer: Medicare Other | Admitting: Internal Medicine

## 2016-02-29 VITALS — BP 110/70 | HR 86 | Resp 18 | Ht <= 58 in | Wt 193.0 lb

## 2016-02-29 DIAGNOSIS — Z780 Asymptomatic menopausal state: Secondary | ICD-10-CM | POA: Diagnosis not present

## 2016-02-29 DIAGNOSIS — Z0000 Encounter for general adult medical examination without abnormal findings: Secondary | ICD-10-CM

## 2016-02-29 DIAGNOSIS — Z23 Encounter for immunization: Secondary | ICD-10-CM

## 2016-02-29 NOTE — Progress Notes (Signed)
Pre visit review using our clinic review tool, if applicable. No additional management support is needed unless otherwise documented below in the visit note. 

## 2016-02-29 NOTE — Progress Notes (Signed)
Subjective:    Patient ID: Shelley Hayes, female    DOB: April 03, 1945, 70 y.o.   MRN: [id number]  HPI  NO SHOW  Medications and allergies reviewed with patient and updated if appropriate.  Patient Active Problem List   Diagnosis Date Noted  . Palpitations 12/23/2015  . Cervical paraspinal muscle spasm 12/23/2015  . SCIATICA, LEFT 01/20/2010  . Hyperlipidemia 08/23/2009  . GLUCOMA 08/23/2009  . Essential hypertension 08/23/2009  . ALLERGIC RHINITIS 08/23/2009  . ARTHRITIS 08/23/2009  . KNEE PAIN 08/23/2009    Current Outpatient Prescriptions on File Prior to Visit  Medication Sig Dispense Refill  . albuterol (PROVENTIL HFA;VENTOLIN HFA) 108 (90 BASE) MCG/ACT inhaler Inhale 2 puffs into the lungs every 6 (six) hours as needed for wheezing or shortness of breath. (Patient not taking: Reported on 02/29/2016) 1 Inhaler 0  . ALPRAZolam (XANAX) 0.5 MG tablet Take 1 tablet (0.5 mg total) by mouth 2 (two) times daily as needed for anxiety or sleep. 60 tablet 1  . amLODipine (NORVASC) 10 MG tablet Take 0.5 tablets (5 mg total) by mouth daily. 90 tablet 3  . aspirin 81 MG tablet Take 81 mg by mouth daily.      Marland Kitchen atorvastatin (LIPITOR) 40 MG tablet TAKE ONE TABLET BY MOUTH ONCE DAILY 90 tablet 0  . Cholecalciferol (VITAMIN D3) 1000 UNITS CAPS Take 1 capsule (1,000 Units total) by mouth daily. 30 capsule   . hydrochlorothiazide (MICROZIDE) 12.5 MG capsule TAKE ONE CAPSULE BY MOUTH ONCE DAILY 90 capsule 0  . latanoprost (XALATAN) 0.005 % ophthalmic solution Place 1 drop into both eyes at bedtime. (Patient not taking: Reported on 02/29/2016) 2.5 mL 1  . losartan (COZAAR) 50 MG tablet Take 1 tablet (50 mg total) by mouth daily. (Patient not taking: Reported on 02/29/2016) 90 tablet 0  . Multiple Vitamin (MULTIVITAMIN) tablet Take 1 tablet by mouth daily.      . potassium chloride SA (K-DUR,KLOR-CON) 20 MEQ tablet Take 1 tablet (20 mEq total) by mouth daily. 90 tablet 3  . pravastatin (PRAVACHOL)  80 MG tablet Take 1 tablet (80 mg total) by mouth daily. (Patient not taking: Reported on 02/29/2016) 90 tablet 3  . valACYclovir (VALTREX) 500 MG tablet Take 1 tablet (500 mg total) by mouth daily. 90 tablet 1  . vitamin B-12 (CYANOCOBALAMIN) 1000 MCG tablet Take 1 tablet (1,000 mcg total) by mouth daily.     No current facility-administered medications on file prior to visit.     Past Medical History:  Diagnosis Date  . ALLERGIC RHINITIS   . ARTHRITIS   . GLUCOMA   . HYPERLIPIDEMIA   . HYPERTENSION   . SCIATICA, LEFT 11/2010    Past Surgical History:  Procedure Laterality Date  . TUBAL LIGATION      Social History   Social History  . Marital status: Divorced    Spouse name: N/A  . Number of children: N/A  . Years of education: N/A   Social History Main Topics  . Smoking status: Never Smoker  . Smokeless tobacco: Never Used  . Alcohol use No  . Drug use: No  . Sexual activity: Not on file   Other Topics Concern  . Not on file   Social History Narrative   Divorced, lives alone- in [place] for 58yrs but moved "home" to [place] in 2008      Walking regularly, every morning    Family History  Problem Relation Age of Onset  . Arthritis Mother   .  Diabetes Mother   . Colon cancer Mother 60  . Arthritis Father   . Alcohol abuse Brother   . Lung cancer Brother   . Prostate cancer Brother   . Diabetes Brother   . Hypertension Brother   . Lung cancer Other     [place]ice    Review of Systems     Objective:  There were no vitals filed for this visit. There were no vitals filed for this visit. There is no height or weight on file to calculate BMI.   Physical Exam         Assessment & Plan:         This encounter was created in error - please disregard.

## 2016-02-29 NOTE — Patient Instructions (Addendum)
Continue to eat heart healthy diet (full of fruits, vegetables, whole grains, lean protein, water--limit salt, fat, and sugar intake) and increase physical activity as tolerated.  Continue doing brain stimulating activities (puzzles, reading, adult coloring books, staying active) to keep memory sharp.   Bring a copy of your advance directives to your next office visit.    Fall Prevention in the Home Introduction Falls can cause injuries. They can happen to people of all ages. There are many things you can do to make your home safe and to help prevent falls. What can I do on the outside of my home?  Regularly fix the edges of walkways and driveways and fix any cracks.  Remove anything that might make you trip as you walk through a door, such as a raised step or threshold.  Trim any bushes or trees on the path to your home.  Use bright outdoor lighting.  Clear any walking paths of anything that might make someone trip, such as rocks or tools.  Regularly check to see if handrails are loose or broken. Make sure that both sides of any steps have handrails.  Any raised decks and porches should have guardrails on the edges.  Have any leaves, snow, or ice cleared regularly.  Use sand or salt on walking paths during winter.  Clean up any spills in your garage right away. This includes oil or grease spills. What can I do in the bathroom?  Use night lights.  Install grab bars by the toilet and in the tub and shower. Do not use towel bars as grab bars.  Use non-skid mats or decals in the tub or shower.  If you need to sit down in the shower, use a plastic, non-slip stool.  Keep the floor dry. Clean up any water that spills on the floor as soon as it happens.  Remove soap buildup in the tub or shower regularly.  Attach bath mats securely with double-sided non-slip rug tape.  Do not have throw rugs and other things on the floor that can make you trip. What can I do in the  bedroom?  Use night lights.  Make sure that you have a light by your bed that is easy to reach.  Do not use any sheets or blankets that are too big for your bed. They should not hang down onto the floor.  Have a firm chair that has side arms. You can use this for support while you get dressed.  Do not have throw rugs and other things on the floor that can make you trip. What can I do in the kitchen?  Clean up any spills right away.  Avoid walking on wet floors.  Keep items that you use a lot in easy-to-reach places.  If you need to reach something above you, use a strong step stool that has a grab bar.  Keep electrical cords out of the way.  Do not use floor polish or wax that makes floors slippery. If you must use wax, use non-skid floor wax.  Do not have throw rugs and other things on the floor that can make you trip. What can I do with my stairs?  Do not leave any items on the stairs.  Make sure that there are handrails on both sides of the stairs and use them. Fix handrails that are broken or loose. Make sure that handrails are as long as the stairways.  Check any carpeting to make sure that it is firmly attached to  the stairs. Fix any carpet that is loose or worn.  Avoid having throw rugs at the top or bottom of the stairs. If you do have throw rugs, attach them to the floor with carpet tape.  Make sure that you have a light switch at the top of the stairs and the bottom of the stairs. If you do not have them, ask someone to add them for you. What else can I do to help prevent falls?  Wear shoes that:  Do not have high heels.  Have rubber bottoms.  Are comfortable and fit you well.  Are closed at the toe. Do not wear sandals.  If you use a stepladder:  Make sure that it is fully opened. Do not climb a closed stepladder.  Make sure that both sides of the stepladder are locked into place.  Ask someone to hold it for you, if possible.  Clearly mark and make  sure that you can see:  Any grab bars or handrails.  First and last steps.  Where the edge of each step is.  Use tools that help you move around (mobility aids) if they are needed. These include:  Canes.  Walkers.  Scooters.  Crutches.  Turn on the lights when you go into a dark area. Replace any light bulbs as soon as they burn out.  Set up your furniture so you have a clear path. Avoid moving your furniture around.  If any of your floors are uneven, fix them.  If there are any pets around you, be aware of where they are.  Review your medicines with your doctor. Some medicines can make you feel dizzy. This can increase your chance of falling. Ask your doctor what other things that you can do to help prevent falls. This information is not intended to replace advice given to you by your health care provider. Make sure you discuss any questions you have with your health care provider. Document Released: 12/24/2008 Document Revised: 08/05/2015 Document Reviewed: 04/03/2014  2017 [employer]  Health Maintenance, Female Introduction Adopting a healthy lifestyle and getting preventive care can go a long way to promote health and wellness. Talk with your health care provider about what schedule of regular examinations is right for you. This is a good chance for you to check in with your provider about disease prevention and staying healthy. In between checkups, there are plenty of things you can do on your own. Experts have done a lot of research about which lifestyle changes and preventive measures are most likely to keep you healthy. Ask your health care provider for more information. Weight and diet Eat a healthy diet  Be sure to include plenty of vegetables, fruits, low-fat dairy products, and lean protein.  Do not eat a lot of foods high in solid fats, added sugars, or salt.  Get regular exercise. This is one of the most important things you can do for your health.  Most  adults should exercise for at least 150 minutes each week. The exercise should increase your heart rate and make you sweat (moderate-intensity exercise).  Most adults should also do strengthening exercises at least twice a week. This is in addition to the moderate-intensity exercise. Maintain a healthy weight  Body mass index (BMI) is a measurement that can be used to identify possible weight problems. It estimates body fat based on height and weight. Your health care provider can help determine your BMI and help you achieve or maintain a healthy weight.  For  females 3 years of age and older:  A BMI below 18.5 is considered underweight.  A BMI of 18.5 to 24.9 is normal.  A BMI of 25 to 29.9 is considered overweight.  A BMI of 30 and above is considered obese. Watch levels of cholesterol and blood lipids  You should start having your blood tested for lipids and cholesterol at 70 years of age, then have this test every 5 years.  You may need to have your cholesterol levels checked more often if:  Your lipid or cholesterol levels are high.  You are older than 70 years of age.  You are at high risk for heart disease. Cancer screening Lung Cancer  Lung cancer screening is recommended for adults 38-80 years old who are at high risk for lung cancer because of a history of smoking.  A yearly low-dose CT scan of the lungs is recommended for people who:  Currently smoke.  Have quit within the past 15 years.  Have at least a 30-pack-year history of smoking. A pack year is smoking an average of one pack of cigarettes a day for 1 year.  Yearly screening should continue until it has been 15 years since you quit.  Yearly screening should stop if you develop a health problem that would prevent you from having lung cancer treatment. Breast Cancer  Practice breast self-awareness. This means understanding how your breasts normally appear and feel.  It also means doing regular breast  self-exams. Let your health care provider know about any changes, no matter how small.  If you are in your 20s or 30s, you should have a clinical breast exam (CBE) by a health care provider every 1-3 years as part of a regular health exam.  If you are 40 or older, have a CBE every year. Also consider having a breast X-ray (mammogram) every year.  If you have a family history of breast cancer, talk to your health care provider about genetic screening.  If you are at high risk for breast cancer, talk to your health care provider about having an MRI and a mammogram every year.  Breast cancer gene (BRCA) assessment is recommended for women who have family members with BRCA-related cancers. BRCA-related cancers include:  Breast.  Ovarian.  Tubal.  Peritoneal cancers.  Results of the assessment will determine the need for genetic counseling and BRCA1 and BRCA2 testing. Cervical Cancer  Your health care provider may recommend that you be screened regularly for cancer of the pelvic organs (ovaries, uterus, and vagina). This screening involves a pelvic examination, including checking for microscopic changes to the surface of your cervix (Pap test). You may be encouraged to have this screening done every 3 years, beginning at age 75.  For women ages 69-65, health care providers may recommend pelvic exams and Pap testing every 3 years, or they may recommend the Pap and pelvic exam, combined with testing for human papilloma virus (HPV), every 5 years. Some types of HPV increase your risk of cervical cancer. Testing for HPV may also be done on women of any age with unclear Pap test results.  Other health care providers may not recommend any screening for nonpregnant women who are considered low risk for pelvic cancer and who do not have symptoms. Ask your health care provider if a screening pelvic exam is right for you.  If you have had past treatment for cervical cancer or a condition that could lead  to cancer, you need Pap tests and screening for  cancer for at least 20 years after your treatment. If Pap tests have been discontinued, your risk factors (such as having a new sexual partner) need to be reassessed to determine if screening should resume. Some women have medical problems that increase the chance of getting cervical cancer. In these cases, your health care provider may recommend more frequent screening and Pap tests. Colorectal Cancer  This type of cancer can be detected and often prevented.  Routine colorectal cancer screening usually begins at 70 years of age and continues through 70 years of age.  Your health care provider may recommend screening at an earlier age if you have risk factors for colon cancer.  Your health care provider may also recommend using home test kits to check for hidden blood in the stool.  A small camera at the end of a tube can be used to examine your colon directly (sigmoidoscopy or colonoscopy). This is done to check for the earliest forms of colorectal cancer.  Routine screening usually begins at age 24.  Direct examination of the colon should be repeated every 5-10 years through 70 years of age. However, you may need to be screened more often if early forms of precancerous polyps or small growths are found. Skin Cancer  Check your skin from head to toe regularly.  Tell your health care provider about any new moles or changes in moles, especially if there is a change in a mole's shape or color.  Also tell your health care provider if you have a mole that is larger than the size of a pencil eraser.  Always use sunscreen. Apply sunscreen liberally and repeatedly throughout the day.  Protect yourself by wearing long sleeves, pants, a wide-brimmed hat, and sunglasses whenever you are outside. Heart disease, diabetes, and high blood pressure  High blood pressure causes heart disease and increases the risk of stroke. High blood pressure is more  likely to develop in:  People who have blood pressure in the high end of the normal range (130-139/85-89 mm Hg).  People who are overweight or obese.  People who are African American.  If you are 23-4 years of age, have your blood pressure checked every 3-5 years. If you are 23 years of age or older, have your blood pressure checked every year. You should have your blood pressure measured twice-once when you are at a hospital or clinic, and once when you are not at a hospital or clinic. Record the average of the two measurements. To check your blood pressure when you are not at a hospital or clinic, you can use:  An automated blood pressure machine at a pharmacy.  A home blood pressure monitor.  If you are between 61 years and 64 years old, ask your health care provider if you should take aspirin to prevent strokes.  Have regular diabetes screenings. This involves taking a blood sample to check your fasting blood sugar level.  If you are at a normal weight and have a low risk for diabetes, have this test once every three years after 70 years of age.  If you are overweight and have a high risk for diabetes, consider being tested at a younger age or more often. Preventing infection Hepatitis B  If you have a higher risk for hepatitis B, you should be screened for this virus. You are considered at high risk for hepatitis B if:  You were born in a country where hepatitis B is common. Ask your health care provider which countries  are considered high risk.  Your parents were born in a high-risk country, and you have not been immunized against hepatitis B (hepatitis B vaccine).  You have HIV or AIDS.  You use needles to inject street drugs.  You live with someone who has hepatitis B.  You have had sex with someone who has hepatitis B.  You get hemodialysis treatment.  You take certain medicines for conditions, including cancer, organ transplantation, and autoimmune  conditions. Hepatitis C  Blood testing is recommended for:  Everyone born from [birth year] through 1965.  Anyone with known risk factors for hepatitis C. Sexually transmitted infections (STIs)  You should be screened for sexually transmitted infections (STIs) including gonorrhea and chlamydia if:  You are sexually active and are younger than 70 years of age.  You are older than 70 years of age and your health care provider tells you that you are at risk for this type of infection.  Your sexual activity has changed since you were last screened and you are at an increased risk for chlamydia or gonorrhea. Ask your health care provider if you are at risk.  If you do not have HIV, but are at risk, it may be recommended that you take a prescription medicine daily to prevent HIV infection. This is called pre-exposure prophylaxis (PrEP). You are considered at risk if:  You are sexually active and do not regularly use condoms or know the HIV status of your partner(s).  You take drugs by injection.  You are sexually active with a partner who has HIV. Talk with your health care provider about whether you are at high risk of being infected with HIV. If you choose to begin PrEP, you should first be tested for HIV. You should then be tested every 3 months for as long as you are taking PrEP. Pregnancy  If you are premenopausal and you may become pregnant, ask your health care provider about preconception counseling.  If you may become pregnant, take 400 to 800 micrograms (mcg) of folic acid every day.  If you want to prevent pregnancy, talk to your health care provider about birth control (contraception). Osteoporosis and menopause  Osteoporosis is a disease in which the bones lose minerals and strength with aging. This can result in serious bone fractures. Your risk for osteoporosis can be identified using a bone density scan.  If you are 76 years of age or older, or if you are at risk for  osteoporosis and fractures, ask your health care provider if you should be screened.  Ask your health care provider whether you should take a calcium or vitamin D supplement to lower your risk for osteoporosis.  Menopause may have certain physical symptoms and risks.  Hormone replacement therapy may reduce some of these symptoms and risks. Talk to your health care provider about whether hormone replacement therapy is right for you. Follow these instructions at home:  Schedule regular health, dental, and eye exams.  Stay current with your immunizations.  Do not use any tobacco products including cigarettes, chewing tobacco, or electronic cigarettes.  If you are pregnant, do not drink alcohol.  If you are breastfeeding, limit how much and how often you drink alcohol.  Limit alcohol intake to no more than 1 drink per day for nonpregnant women. One drink equals 12 ounces of beer, 5 ounces of wine, or 1 ounces of hard liquor.  Do not use street drugs.  Do not share needles.  Ask your health care provider for help  if you need support or information about quitting drugs.  Tell your health care provider if you often feel depressed.  Tell your health care provider if you have ever been abused or do not feel safe at home. This information is not intended to replace advice given to you by your health care provider. Make sure you discuss any questions you have with your health care provider. Document Released: 09/12/2010 Document Revised: 08/05/2015 Document Reviewed: 12/01/2014  2017 [employer]

## 2016-02-29 NOTE — Progress Notes (Signed)
Subjective:    Patient ID: Shelley Hayes, female    DOB: [birth year], 70 y.o.   MRN: [id number]  HPI   ERROR     Medications and allergies reviewed with patient and updated if appropriate.  Patient Active Problem List   Diagnosis Date Noted  . Palpitations 12/23/2015  . Cervical paraspinal muscle spasm 12/23/2015  . SCIATICA, LEFT 01/20/2010  . Hyperlipidemia 08/23/2009  . GLUCOMA 08/23/2009  . Essential hypertension 08/23/2009  . ALLERGIC RHINITIS 08/23/2009  . ARTHRITIS 08/23/2009  . KNEE PAIN 08/23/2009    Current Outpatient Prescriptions on File Prior to Visit  Medication Sig Dispense Refill  . albuterol (PROVENTIL HFA;VENTOLIN HFA) 108 (90 BASE) MCG/ACT inhaler Inhale 2 puffs into the lungs every 6 (six) hours as needed for wheezing or shortness of breath. 1 Inhaler 0  . ALPRAZolam (XANAX) 0.5 MG tablet Take 1 tablet (0.5 mg total) by mouth 2 (two) times daily as needed for anxiety or sleep. 60 tablet 1  . amLODipine (NORVASC) 10 MG tablet Take 0.5 tablets (5 mg total) by mouth daily. 90 tablet 3  . amLODipine (NORVASC) 10 MG tablet TAKE ONE TABLET BY MOUTH ONCE DAILY 90 tablet 3  . aspirin 81 MG tablet Take 81 mg by mouth daily.      Marland Kitchen atorvastatin (LIPITOR) 40 MG tablet TAKE ONE TABLET BY MOUTH ONCE DAILY 90 tablet 0  . Cholecalciferol (VITAMIN D3) 1000 UNITS CAPS Take 1 capsule (1,000 Units total) by mouth daily. 30 capsule   . hydrochlorothiazide (MICROZIDE) 12.5 MG capsule TAKE ONE CAPSULE BY MOUTH ONCE DAILY 90 capsule 0  . latanoprost (XALATAN) 0.005 % ophthalmic solution Place 1 drop into both eyes at bedtime. 2.5 mL 1  . losartan (COZAAR) 50 MG tablet Take 1 tablet (50 mg total) by mouth daily. 90 tablet 0  . Multiple Vitamin (MULTIVITAMIN) tablet Take 1 tablet by mouth daily.      . potassium chloride SA (K-DUR,KLOR-CON) 20 MEQ tablet Take 1 tablet (20 mEq total) by mouth daily. 90 tablet 3  . pravastatin (PRAVACHOL) 80 MG tablet Take 1 tablet (80 mg total) by  mouth daily. 90 tablet 3  . valACYclovir (VALTREX) 500 MG tablet Take 1 tablet (500 mg total) by mouth daily. 90 tablet 1  . vitamin B-12 (CYANOCOBALAMIN) 1000 MCG tablet Take 1 tablet (1,000 mcg total) by mouth daily.     No current facility-administered medications on file prior to visit.     Past Medical History:  Diagnosis Date  . ALLERGIC RHINITIS   . ARTHRITIS   . GLUCOMA   . HYPERLIPIDEMIA   . HYPERTENSION   . SCIATICA, LEFT 11/2010    Past Surgical History:  Procedure Laterality Date  . TUBAL LIGATION      Social History   Social History  . Marital status: Divorced    Spouse name: N/A  . Number of children: N/A  . Years of education: N/A   Social History Main Topics  . Smoking status: Never Smoker  . Smokeless tobacco: Not on file  . Alcohol use No  . Drug use: No  . Sexual activity: Not on file   Other Topics Concern  . Not on file   Social History Narrative   Divorced, lives alone- in [place] for 38yrs but moved "home" to [place] in 2008      Walking regularly, every morning    Family History  Problem Relation Age of Onset  . Arthritis Mother   . Diabetes  Mother   . Colon cancer Mother 45  . Arthritis Father   . Alcohol abuse Brother   . Lung cancer Brother   . Prostate cancer Brother   . Diabetes Brother   . Hypertension Brother   . Lung cancer Other     Neice    Review of Systems     Objective:  There were no vitals filed for this visit. There were no vitals filed for this visit. There is no height or weight on file to calculate BMI.   Physical Exam         Assessment & Plan:        This encounter was created in error - please disregard.

## 2016-02-29 NOTE — Progress Notes (Addendum)
Subjective:   Shelley Hayes is a 70 y.o. female who presents for Medicare Annual (Subsequent) preventive examination.  The Patient was informed that the wellness visit is to identify future health risk and educate and initiate measures that can reduce risk for increased disease through the lifespan.   Describes health as fair, good or great? "good"  Review of Systems:  No ROS.  Medicare Wellness Visit.  Cardiac Risk Factors include: advanced age (>27men, >51 women);dyslipidemia;family history of premature cardiovascular disease;hypertension;obesity (BMI >30kg/m2)   Sleep patterns:  Sleeps 6 hours.  Home Safety/Smoke Alarms: smoke detectors in place. Living environment; residence and Firearm Safety: Daughter lives with patient in 1 story home. Firearms locked away when children in home, safety discussed.  Seat Belt Safety/Bike Helmet: Wears seat belt.    Counseling:   Eye Exam-Last exam 08/2015, every 6 months by [facility] exam >5 years. Full dentures on top, partial bottoms.   Female:   Pap-02/2016, followed by GYN.  Mammo-02/2016, [facility], will request records. Dexa scan-Pt reports last > 9 years. Order placed.       CCS-colonoscopy 03/14/2007, polyp.           Objective:     Vitals: BP 110/70 (BP Location: Right Arm, Patient Position: Sitting, Cuff Size: Large)   Pulse 86   Resp 18   Ht 4\' 9"  (1.448 m)   Wt 193 lb (87.5 kg)   SpO2 95%   BMI 41.76 kg/m   Body mass index is 41.76 kg/m.   Tobacco History  Smoking Status  . Never Smoker  Smokeless Tobacco  . Never Used     Counseling given: No   Past Medical History:  Diagnosis Date  . ALLERGIC RHINITIS   . ARTHRITIS   . GLUCOMA   . HYPERLIPIDEMIA   . HYPERTENSION   . SCIATICA, LEFT 11/2010   Past Surgical History:  Procedure Laterality Date  . TUBAL LIGATION     Family History  Problem Relation Age of Onset  . Arthritis Mother   . Diabetes Mother   . Colon  cancer Mother 63  . Arthritis Father   . Alcohol abuse Brother   . Lung cancer Brother   . Prostate cancer Brother   . Diabetes Brother   . Hypertension Brother   . Lung cancer Other     Neice   History  Sexual Activity  . Sexual activity: Not on file    Outpatient Encounter Prescriptions as of 02/29/2016  Medication Sig  . ALPRAZolam (XANAX) 0.5 MG tablet Take 1 tablet (0.5 mg total) by mouth 2 (two) times daily as needed for anxiety or sleep.  Marland Kitchen amLODipine (NORVASC) 10 MG tablet Take 0.5 tablets (5 mg total) by mouth daily.  Marland Kitchen aspirin 81 MG tablet Take 81 mg by mouth daily.    Marland Kitchen atorvastatin (LIPITOR) 40 MG tablet TAKE ONE TABLET BY MOUTH ONCE DAILY  . Cholecalciferol (VITAMIN D3) 1000 UNITS CAPS Take 1 capsule (1,000 Units total) by mouth daily.  . hydrochlorothiazide (MICROZIDE) 12.5 MG capsule TAKE ONE CAPSULE BY MOUTH ONCE DAILY  . Multiple Vitamin (MULTIVITAMIN) tablet Take 1 tablet by mouth daily.    . potassium chloride SA (K-DUR,KLOR-CON) 20 MEQ tablet Take 1 tablet (20 mEq total) by mouth daily.  . valACYclovir (VALTREX) 500 MG tablet Take 1 tablet (500 mg total) by mouth daily.  . vitamin B-12 (CYANOCOBALAMIN) 1000 MCG tablet Take 1 tablet (1,000 mcg total) by mouth daily.  Marland Kitchen  albuterol (PROVENTIL HFA;VENTOLIN HFA) 108 (90 BASE) MCG/ACT inhaler Inhale 2 puffs into the lungs every 6 (six) hours as needed for wheezing or shortness of breath. (Patient not taking: Reported on 02/29/2016)  . latanoprost (XALATAN) 0.005 % ophthalmic solution Place 1 drop into both eyes at bedtime. (Patient not taking: Reported on 02/29/2016)  . losartan (COZAAR) 50 MG tablet Take 1 tablet (50 mg total) by mouth daily. (Patient not taking: Reported on 02/29/2016)  . pravastatin (PRAVACHOL) 80 MG tablet Take 1 tablet (80 mg total) by mouth daily. (Patient not taking: Reported on 02/29/2016)  . [DISCONTINUED] amLODipine (NORVASC) 10 MG tablet TAKE ONE TABLET BY MOUTH ONCE DAILY   No  facility-administered encounter medications on file as of 02/29/2016.     Activities of Daily Living In your present state of health, do you have any difficulty performing the following activities: 02/29/2016  Hearing? N  Vision? N  Difficulty concentrating or making decisions? N  Walking or climbing stairs? N  Dressing or bathing? N  Doing errands, shopping? N  Preparing Food and eating ? N  Using the Toilet? N  In the past six months, have you accidently leaked urine? N  Do you have problems with loss of bowel control? N  Managing your Medications? N  Managing your Finances? N  Housekeeping or managing your Housekeeping? N  Some recent data might be hidden    Patient Care Team: Binnie Rail, MD as PCP - General (Internal Medicine) Everlene Farrier, MD (Obstetrics and Gynecology) Victory Dakin, MD as Referring Physician (Optometry)    Assessment:    Physical assessment deferred to PCP.  Exercise Activities and Dietary recommendations Current Exercise Habits: Home exercise routine, Type of exercise: walking;Other - see comments (back exercises), Time (Minutes): > 60, Frequency (Times/Week): 3, Weekly Exercise (Minutes/Week): 0, Intensity: Moderate, Exercise limited by: None identified   Diet (meal preparation, eat out, water intake, caffeinated beverages, dairy products, fruits and vegetables): [employer], [employer], [employer]. Drinks water.   Breakfast: crackers, peanut butter, yogurt Lunch: sandwich, salad Dinner: sandwich, meat and 2 vegetables.   Encouraged to continue to make healthy food choices. Discussed exercise routine, currently walks around the mall. Patient plans to join silver Sneakers program.   Goals    . Weight (lb) < 175 lb (79.4 kg)          Would like to lose 20 lbs by increasing activity (joining [employer]) and continuing health food choices.       Fall Risk Fall Risk  02/29/2016 02/23/2014  Falls in the past year? No No   Depression  Screen PHQ 2/9 Scores 02/29/2016 02/23/2014 10/25/2011  PHQ - 2 Score 0 2 0  PHQ- 9 Score - 4 -     Cognitive Function       Ad8 score reviewed for issues:  Issues making decisions:no  Less interest in hobbies / activities:no  Repeats questions, stories (family complaining):no  Trouble using ordinary gadgets (microwave, computer, phone):no  Forgets the month or year: no  Mismanaging finances: no  Remembering appts:no  Daily problems with thinking and/or memory:no Ad8 score is=0      Immunization History  Administered Date(s) Administered  . Influenza Whole 01/20/2010  . Influenza, High Dose Seasonal PF 12/30/2012, 02/26/2015, 02/29/2016  . Influenza,inj,Quad PF,36+ Mos 02/23/2014  . Pneumococcal Conjugate-13 02/29/2016  . Pneumococcal Polysaccharide-23 02/23/2014  . Tetanus 10/25/2011   Screening Tests Health Maintenance  Topic Date Due  . DEXA SCAN  09/13/2010  .  ZOSTAVAX  02/28/2017 (Originally 09/12/2005)  . COLONOSCOPY  03/13/2017  . MAMMOGRAM  02/10/2018  . TETANUS/TDAP  10/24/2021  . INFLUENZA VACCINE  Completed  . Hepatitis C Screening  Completed  . PNA vac Low Risk Adult  Completed      Plan:    Continue to eat heart healthy diet (full of fruits, vegetables, whole grains, lean protein, water--limit salt, fat, and sugar intake) and increase physical activity as tolerated.  Continue doing brain stimulating activities (puzzles, reading, adult coloring books, staying active) to keep memory sharp.   Bring a copy of your advance directives to your next office visit.   During the course of the visit the patient was educated and counseled about the following appropriate screening and preventive services:   Vaccines to include Pneumoccal, Influenza, Hepatitis B, Td, Zostavax, HCV  Cardiovascular Disease  Colorectal cancer screening  Bone density screening  Diabetes screening  Glaucoma screening  Mammography/PAP  Nutrition counseling    Patient Instructions (the written plan) was given to the patient.   Gerilyn Nestle, RN  02/29/2016   Medical screening examination/treatment/procedure(s) were performed by non-physician practitioner and as supervising physician I was immediately available for consultation/collaboration. I agree with above. Binnie Rail, MD

## 2016-03-01 ENCOUNTER — Encounter: Payer: Medicare Other | Admitting: Internal Medicine

## 2016-03-17 ENCOUNTER — Other Ambulatory Visit: Payer: Medicare Other

## 2016-03-28 ENCOUNTER — Other Ambulatory Visit: Payer: Medicare Other

## 2016-04-05 ENCOUNTER — Ambulatory Visit
Admission: RE | Admit: 2016-04-05 | Discharge: 2016-04-05 | Disposition: A | Discharge status: Home / Self Care | Payer: Medicare Other | Source: Ambulatory Visit | Attending: Obstetrics and Gynecology | Admitting: Obstetrics and Gynecology

## 2016-04-05 DIAGNOSIS — N644 Mastodynia: Secondary | ICD-10-CM | POA: Diagnosis not present

## 2016-04-18 ENCOUNTER — Other Ambulatory Visit: Payer: Medicare Other

## 2016-04-25 ENCOUNTER — Encounter: Payer: Medicare Other | Admitting: Internal Medicine

## 2016-04-25 NOTE — Progress Notes (Signed)
Subjective:    Patient ID: Shelley Hayes, female    DOB: 07-07-1945, 71 y.o.   MRN: [id number]  HPI No SHOW    Medications and allergies reviewed with patient and updated if appropriate.  Patient Active Problem List   Diagnosis Date Noted  . Palpitations 12/23/2015  . Cervical paraspinal muscle spasm 12/23/2015  . SCIATICA, LEFT 01/20/2010  . Hyperlipidemia 08/23/2009  . GLUCOMA 08/23/2009  . Essential hypertension 08/23/2009  . ALLERGIC RHINITIS 08/23/2009  . ARTHRITIS 08/23/2009  . KNEE PAIN 08/23/2009    Current Outpatient Prescriptions on File Prior to Visit  Medication Sig Dispense Refill  . albuterol (PROVENTIL HFA;VENTOLIN HFA) 108 (90 BASE) MCG/ACT inhaler Inhale 2 puffs into the lungs every 6 (six) hours as needed for wheezing or shortness of breath. (Patient not taking: Reported on 02/29/2016) 1 Inhaler 0  . ALPRAZolam (XANAX) 0.5 MG tablet Take 1 tablet (0.5 mg total) by mouth 2 (two) times daily as needed for anxiety or sleep. 60 tablet 1  . amLODipine (NORVASC) 10 MG tablet Take 0.5 tablets (5 mg total) by mouth daily. 90 tablet 3  . aspirin 81 MG tablet Take 81 mg by mouth daily.      Marland Kitchen atorvastatin (LIPITOR) 40 MG tablet TAKE ONE TABLET BY MOUTH ONCE DAILY 90 tablet 0  . Cholecalciferol (VITAMIN D3) 1000 UNITS CAPS Take 1 capsule (1,000 Units total) by mouth daily. 30 capsule   . hydrochlorothiazide (MICROZIDE) 12.5 MG capsule TAKE ONE CAPSULE BY MOUTH ONCE DAILY 90 capsule 0  . latanoprost (XALATAN) 0.005 % ophthalmic solution Place 1 drop into both eyes at bedtime. (Patient not taking: Reported on 02/29/2016) 2.5 mL 1  . losartan (COZAAR) 50 MG tablet Take 1 tablet (50 mg total) by mouth daily. (Patient not taking: Reported on 02/29/2016) 90 tablet 0  . Multiple Vitamin (MULTIVITAMIN) tablet Take 1 tablet by mouth daily.      . potassium chloride SA (K-DUR,KLOR-CON) 20 MEQ tablet Take 1 tablet (20 mEq total) by mouth daily. 90 tablet 3  . pravastatin  (PRAVACHOL) 80 MG tablet Take 1 tablet (80 mg total) by mouth daily. (Patient not taking: Reported on 02/29/2016) 90 tablet 3  . valACYclovir (VALTREX) 500 MG tablet Take 1 tablet (500 mg total) by mouth daily. 90 tablet 1  . vitamin B-12 (CYANOCOBALAMIN) 1000 MCG tablet Take 1 tablet (1,000 mcg total) by mouth daily.     No current facility-administered medications on file prior to visit.     Past Medical History:  Diagnosis Date  . ALLERGIC RHINITIS   . ARTHRITIS   . GLUCOMA   . HYPERLIPIDEMIA   . HYPERTENSION   . SCIATICA, LEFT 11/2010    Past Surgical History:  Procedure Laterality Date  . TUBAL LIGATION      Social History   Social History  . Marital status: Divorced    Spouse name: N/A  . Number of children: N/A  . Years of education: N/A   Social History Main Topics  . Smoking status: Never Smoker  . Smokeless tobacco: Never Used  . Alcohol use No  . Drug use: No  . Sexual activity: Not on file   Other Topics Concern  . Not on file   Social History Narrative   Divorced, lives alone- in [place] for 69yrs but moved "home" to [place] in 2008      Walking regularly, every morning    Family History  Problem Relation Age of Onset  . Arthritis Mother   .  Diabetes Mother   . Colon cancer Mother 42  . Arthritis Father   . Alcohol abuse Brother   . Lung cancer Brother   . Prostate cancer Brother   . Diabetes Brother   . Hypertension Brother   . Lung cancer Other     Neice    Review of Systems     Objective:  There were no vitals filed for this visit. There were no vitals filed for this visit. There is no height or weight on file to calculate BMI.  Wt Readings from Last 3 Encounters:  02/29/16 193 lb (87.5 kg)  12/23/15 195 lb (88.5 kg)  02/26/15 208 lb (94.3 kg)     Physical Exam        Assessment & Plan:     This encounter was created in error - please disregard.

## 2016-04-26 ENCOUNTER — Encounter: Payer: Self-pay | Admitting: Internal Medicine

## 2016-04-26 ENCOUNTER — Ambulatory Visit (INDEPENDENT_AMBULATORY_CARE_PROVIDER_SITE_OTHER)
Admission: RE | Admit: 2016-04-26 | Discharge: 2016-04-26 | Disposition: A | Discharge status: Home / Self Care | Payer: Medicare Other | Source: Ambulatory Visit | Attending: Internal Medicine | Admitting: Internal Medicine

## 2016-04-26 DIAGNOSIS — Z780 Asymptomatic menopausal state: Secondary | ICD-10-CM | POA: Diagnosis not present

## 2016-04-26 DIAGNOSIS — M8580 Other specified disorders of bone density and structure, unspecified site: Secondary | ICD-10-CM

## 2016-04-26 DIAGNOSIS — M810 Age-related osteoporosis without current pathological fracture: Secondary | ICD-10-CM | POA: Insufficient documentation

## 2016-04-26 HISTORY — DX: Other specified disorders of bone density and structure, unspecified site: M85.80

## 2016-05-24 ENCOUNTER — Ambulatory Visit: Payer: Medicare Other | Admitting: Urology

## 2016-05-25 DIAGNOSIS — H401134 Primary open-angle glaucoma, bilateral, indeterminate stage: Secondary | ICD-10-CM | POA: Diagnosis not present

## 2016-05-25 DIAGNOSIS — H25813 Combined forms of age-related cataract, bilateral: Secondary | ICD-10-CM | POA: Diagnosis not present

## 2016-05-30 ENCOUNTER — Encounter: Payer: Medicare Other | Admitting: Internal Medicine

## 2016-06-12 ENCOUNTER — Other Ambulatory Visit: Payer: Self-pay | Admitting: *Deleted

## 2016-06-12 MED ORDER — HYDROCHLOROTHIAZIDE 12.5 MG PO CAPS: 12.5000 mg | ORAL_CAPSULE | Freq: Every day | ORAL | 2 refills | Status: DC

## 2016-06-13 DIAGNOSIS — H2512 Age-related nuclear cataract, left eye: Secondary | ICD-10-CM | POA: Diagnosis not present

## 2016-06-13 DIAGNOSIS — H401131 Primary open-angle glaucoma, bilateral, mild stage: Secondary | ICD-10-CM | POA: Diagnosis not present

## 2016-06-13 DIAGNOSIS — H25811 Combined forms of age-related cataract, right eye: Secondary | ICD-10-CM | POA: Diagnosis not present

## 2016-06-17 NOTE — Progress Notes (Signed)
Subjective:    Patient ID: Shelley Hayes, female    DOB: 17-Jun-1945, 71 y.o.   MRN: [id number]  HPI Here for medicare wellness exam and follow up of her chronic medical problems.   I have personally reviewed and have noted 1.The patient's medical and social history 2.Their use of alcohol, tobacco or illicit drugs 3.Their current medications and supplements 4.The patient's functional ability including ADL's, fall risks, home safety risks and hearing or visual impairment. 5.Diet and physical activities 6.Evidence for depression or mood disorders 7.Care team reviewed  - gyn - dr tomplin  She inhaled smoke during easter and it irritated her airway.  She has a dry cough since then.  She denies wheeze and sob.   Her ears and throat are irritated.  It always feels like there is something in her ears.    When she swallows it feels different and a little difficulty.  She feels like something is in the left throat.    Hypertension: She is taking her medication daily, but did not take it today due to a stressful situation. She is compliant with a low sodium diet.  She denies chest pain, palpitations, edema, shortness of breath and regular headaches. She is exercising regularly.  She does monitor her blood pressure at home and it is well controlled.    Hyperlipidemia: She is taking her medication daily. She is compliant with a low fat/cholesterol diet. She is exercising regularly. She denies myalgias.  Her legs cramp when she walks for too long.  At night she has a lot of cramping in her lower legs and feet.     Are there smokers in your home (other than you)? No  Risk Factors Exercise: walking some - leg cramps limit her Dietary issues discussed: well balanced  Cardiac risk factors: advanced age, hypertension, hyperlipidemia, and obesity (BMI >= 30 kg/m2).  Depression Screen  Have you felt down, depressed or hopeless?  No  Have you felt little interest or pleasure in doing things?  No  Activities of Daily Living In your present state of health, do you have any difficulty performing the following activities?:  Driving? No Managing money?  No Feeding yourself? No Getting from bed to chair? No Climbing a flight of stairs? No Preparing food and eating?: No Bathing or showering? No Getting dressed: No Getting to/using the toilet? No Moving around from place to place: No In the past year have you fallen or had a near fall?: No   Are you sexually active?  No  Do you have more than one partner?  N/A  Hearing Difficulties: No Do you often ask people to speak up or repeat themselves? No Do you experience ringing or noises in your ears? sometimes Do you have difficulty understanding soft or whispered voices? No Vision:              Any change in vision: yes - cataract surgery planned             Up to date with eye exam:  yes Memory:  Do you feel that you have a problem with memory? No  Do you often misplace items? No  Do you feel safe at home?  Yes  Cognitive Testing  Alert, Orientated? Yes  Normal Appearance? Yes  Recall of three objects?  Yes  Can perform simple calculations? Yes  Displays appropriate judgment? Yes  Can read the correct time from a watch face? Yes   Advanced Directives have been discussed  with the patient? Yes - in place   Medications and allergies reviewed with patient and updated if appropriate.  Patient Active Problem List   Diagnosis Date Noted  . Osteopenia 04/26/2016  . Palpitations 12/23/2015  . Cervical paraspinal muscle spasm 12/23/2015  . SCIATICA, LEFT 01/20/2010  . Hyperlipidemia 08/23/2009  . GLUCOMA 08/23/2009  . Essential hypertension 08/23/2009  . ALLERGIC RHINITIS 08/23/2009  . ARTHRITIS 08/23/2009  . KNEE PAIN 08/23/2009    Current Outpatient Prescriptions on File Prior to Visit  Medication Sig Dispense Refill  . ALPRAZolam (XANAX) 0.5 MG tablet  Take 1 tablet (0.5 mg total) by mouth 2 (two) times daily as needed for anxiety or sleep. 60 tablet 1  . amLODipine (NORVASC) 10 MG tablet Take 0.5 tablets (5 mg total) by mouth daily. 90 tablet 3  . aspirin 81 MG tablet Take 81 mg by mouth daily.      Marland Kitchen atorvastatin (LIPITOR) 40 MG tablet TAKE ONE TABLET BY MOUTH ONCE DAILY 90 tablet 0  . Cholecalciferol (VITAMIN D3) 1000 UNITS CAPS Take 1 capsule (1,000 Units total) by mouth daily. 30 capsule   . hydrochlorothiazide (MICROZIDE) 12.5 MG capsule Take 1 capsule (12.5 mg total) by mouth daily. Yearly physical due in Oct must have appt for future refills 90 capsule 2  . Multiple Vitamin (MULTIVITAMIN) tablet Take 1 tablet by mouth daily.      . valACYclovir (VALTREX) 500 MG tablet Take 1 tablet (500 mg total) by mouth daily. 90 tablet 1  . vitamin B-12 (CYANOCOBALAMIN) 1000 MCG tablet Take 1 tablet (1,000 mcg total) by mouth daily.     No current facility-administered medications on file prior to visit.     Past Medical History:  Diagnosis Date  . ALLERGIC RHINITIS   . ARTHRITIS   . GLUCOMA   . HYPERLIPIDEMIA   . HYPERTENSION   . SCIATICA, LEFT 11/2010    Past Surgical History:  Procedure Laterality Date  . TUBAL LIGATION      Social History   Social History  . Marital status: Divorced    Spouse name: N/A  . Number of children: N/A  . Years of education: N/A   Social History Main Topics  . Smoking status: Never Smoker  . Smokeless tobacco: Never Used  . Alcohol use No  . Drug use: No  . Sexual activity: Not on file   Other Topics Concern  . Not on file   Social History Narrative   Divorced, lives alone- in [place] for 34yrs but moved "home" to [place] in 2008      Walking regularly, every morning    Family History  Problem Relation Age of Onset  . Arthritis Mother   . Diabetes Mother   . Colon cancer Mother 57  . Arthritis Father   . Alcohol abuse Brother   . Lung cancer Brother   . Prostate cancer Brother   .  Diabetes Brother   . Hypertension Brother   . Lung cancer Other     Neice    Review of Systems  Constitutional: Negative for chills and fever.  Respiratory: Positive for cough. Negative for shortness of breath and wheezing.   Cardiovascular: Positive for leg swelling (only with prolonged sitting). Negative for chest pain and palpitations.  Gastrointestinal: Negative for abdominal pain, blood in stool, constipation and diarrhea.  Musculoskeletal: Positive for back pain and myalgias (leg cramps).  Neurological: Negative for light-headedness and headaches.  Psychiatric/Behavioral: Negative for dysphoric mood. The patient is not nervous/anxious.  Objective:   Vitals:   06/19/16 1507  BP: (!) 154/96  Pulse: 97  Resp: 16  Temp: 98.6 F (37 C)   Filed Weights   06/19/16 1507  Weight: 203 lb (92.1 kg)   Body mass index is 43.93 kg/m.  Wt Readings from Last 3 Encounters:  06/19/16 203 lb (92.1 kg)  02/29/16 193 lb (87.5 kg)  12/23/15 195 lb (88.5 kg)     Physical Exam Constitutional: She appears well-developed and well-nourished. No distress.  HENT:  Head: Normocephalic and atraumatic.  Right Ear: External ear normal. Normal ear canal and TM Left Ear: External ear normal.  Normal ear canal and TM Mouth/Throat: Oropharynx is clear and moist.  Eyes: Conjunctivae and EOM are normal.  Neck: Neck supple. No tracheal deviation present. No thyromegaly present.  No carotid bruit  Cardiovascular: Normal rate, regular rhythm and normal heart sounds.   No murmur heard.  No edema. Pulmonary/Chest: Effort normal and breath sounds normal. No respiratory distress. She has no wheezes. She has no rales.  Breast: deferred to Gyn Abdominal: Soft. She exhibits no distension. There is no tenderness.  Lymphadenopathy: She has no cervical adenopathy.  Skin: Skin is warm and dry. She is not diaphoretic.  Psychiatric: She has a normal mood and affect. Her behavior is normal.          Assessment & Plan:   Wellness Exam: Immunizations  Up to date  Colonoscopy  Up to date  -- due next year Mammogram  Up to date  Dexa  Up to date  Gyn Up to date  Eye exam  Up to date  Hearing loss   none Memory concerns/difficulties   none Independent of ADLs  fully Stressed the importance of regular exercise   Patient received copy of preventative screening tests/immunizations recommended for the next 5-10 years.   See Problem List for Assessment and Plan of chronic medical problems.  FU in 6 months

## 2016-06-17 NOTE — Patient Instructions (Addendum)
  Shelley Hayes , Thank you for taking time to come for your Medicare Wellness Visit. I appreciate your ongoing commitment to your health goals. Please review the following plan we discussed and let me know if I can assist you in the future.   These are the goals we discussed: Goals    . Work on [employer] loss                 This is a list of the screening recommended for you and due dates:  Health Maintenance  Topic Date Due  . Flu Shot  10/11/2016  . Colon Cancer Screening  03/13/2017  . Mammogram  02/10/2018  . DEXA scan (bone density measurement)  04/26/2018  . Tetanus Vaccine  10/24/2021  .  Hepatitis C: One time screening is recommended by Center for Disease Control  (CDC) for  adults born from 2 through 1965.   Completed  . Pneumonia vaccines  Completed    Start taking zyrtec and flonase for allergies. An inhaler was sent for your cough.    Stop lipitor for 2-4 weeks to see if your muscle cramps go away.   Test(s) ordered today. Your results will be released to [place] (or called to you) after review, usually within 72hours after test completion. If any changes need to be made, you will be notified at that same time.  All other Health Maintenance issues reviewed.   All recommended immunizations and age-appropriate screenings are up-to-date or discussed.  No immunizations administered today.   Medications reviewed and updated.  Changes include starting allergy medication and an inhaler.   Your prescription(s) have been submitted to your pharmacy. Please take as directed and contact our office if you believe you are having problem(s) with the medication(s).   Please followup in 6 months

## 2016-06-19 ENCOUNTER — Ambulatory Visit (INDEPENDENT_AMBULATORY_CARE_PROVIDER_SITE_OTHER): Payer: Medicare Other | Admitting: Internal Medicine

## 2016-06-19 ENCOUNTER — Other Ambulatory Visit (INDEPENDENT_AMBULATORY_CARE_PROVIDER_SITE_OTHER): Payer: Medicare Other

## 2016-06-19 ENCOUNTER — Encounter: Payer: Self-pay | Admitting: Internal Medicine

## 2016-06-19 VITALS — BP 154/96 | HR 97 | Temp 98.6°F | Resp 16 | Ht <= 58 in | Wt 203.0 lb

## 2016-06-19 DIAGNOSIS — R059 Cough, unspecified: Secondary | ICD-10-CM | POA: Insufficient documentation

## 2016-06-19 DIAGNOSIS — R002 Palpitations: Secondary | ICD-10-CM

## 2016-06-19 DIAGNOSIS — R739 Hyperglycemia, unspecified: Secondary | ICD-10-CM | POA: Insufficient documentation

## 2016-06-19 DIAGNOSIS — R05 Cough: Secondary | ICD-10-CM

## 2016-06-19 DIAGNOSIS — I10 Essential (primary) hypertension: Secondary | ICD-10-CM

## 2016-06-19 DIAGNOSIS — R252 Cramp and spasm: Secondary | ICD-10-CM

## 2016-06-19 DIAGNOSIS — E7800 Pure hypercholesterolemia, unspecified: Secondary | ICD-10-CM

## 2016-06-19 DIAGNOSIS — Z0000 Encounter for general adult medical examination without abnormal findings: Secondary | ICD-10-CM

## 2016-06-19 DIAGNOSIS — M85861 Other specified disorders of bone density and structure, right lower leg: Secondary | ICD-10-CM

## 2016-06-19 DIAGNOSIS — M85862 Other specified disorders of bone density and structure, left lower leg: Secondary | ICD-10-CM

## 2016-06-19 DIAGNOSIS — J309 Allergic rhinitis, unspecified: Secondary | ICD-10-CM

## 2016-06-19 HISTORY — DX: Hyperglycemia, unspecified: R73.9

## 2016-06-19 HISTORY — DX: Cough, unspecified: R05.9

## 2016-06-19 HISTORY — DX: Cramp and spasm: R25.2

## 2016-06-19 LAB — COMPREHENSIVE METABOLIC PANEL
ALT: 21 U/L (ref 0–35)
AST: 22 U/L (ref 0–37)
Albumin: 4.3 g/dL (ref 3.5–5.2)
Alkaline Phosphatase: 84 U/L (ref 39–117)
BUN: 16 mg/dL (ref 6–23)
CHLORIDE: 105 meq/L (ref 96–112)
CO2: 26 meq/L (ref 19–32)
CREATININE: 0.81 mg/dL (ref 0.40–1.20)
Calcium: 9.6 mg/dL (ref 8.4–10.5)
GFR: 89.7 mL/min (ref >60.00)
GLUCOSE: 90 mg/dL (ref 70–99)
POTASSIUM: 3.6 meq/L (ref 3.5–5.1)
SODIUM: 141 meq/L (ref 135–145)
Total Bilirubin: 0.5 mg/dL (ref 0.2–1.2)
Total Protein: 8.3 g/dL (ref 6.0–8.3)

## 2016-06-19 LAB — CBC WITH DIFFERENTIAL/PLATELET
BASOS PCT: 0.9 % (ref 0.0–3.0)
Basophils Absolute: 0.1 10*3/uL (ref 0.0–0.1)
EOS PCT: 1.4 % (ref 0.0–5.0)
Eosinophils Absolute: 0.1 10*3/uL (ref 0.0–0.7)
HCT: 40.6 % (ref 36.0–46.0)
HEMOGLOBIN: 13.3 g/dL (ref 12.0–15.0)
Lymphocytes Relative: 24.1 % (ref 12.0–46.0)
Lymphs Abs: 2.3 10*3/uL (ref 0.7–4.0)
MCHC: 32.9 g/dL (ref 30.0–36.0)
MCV: 85.7 fl (ref 78.0–100.0)
Monocytes Absolute: 0.8 10*3/uL (ref 0.1–1.0)
Monocytes Relative: 8.7 % (ref 3.0–12.0)
NEUTROS ABS: 6.3 10*3/uL (ref 1.4–7.7)
Neutrophils Relative %: 64.9 % (ref 43.0–77.0)
PLATELETS: 317 10*3/uL (ref 150.0–400.0)
RBC: 4.74 Mil/uL (ref 3.87–5.11)
RDW: 14.5 % (ref 11.5–15.5)
WBC: 9.6 10*3/uL (ref 4.0–10.5)

## 2016-06-19 LAB — LIPID PANEL
CHOL/HDL RATIO: 5
Cholesterol: 208 mg/dL — ABNORMAL HIGH (ref 0–200)
HDL: 45.2 mg/dL (ref >39.00)
LDL CALC: 146 mg/dL — AB (ref 0–99)
NONHDL: 162.85
Triglycerides: 85 mg/dL (ref 0.0–149.0)
VLDL: 17 mg/dL (ref 0.0–40.0)

## 2016-06-19 LAB — HEMOGLOBIN A1C: HEMOGLOBIN A1C: 5.6 % (ref 4.6–6.5)

## 2016-06-19 LAB — TSH: TSH: 1.06 u[IU]/mL (ref 0.35–4.50)

## 2016-06-19 LAB — MAGNESIUM: MAGNESIUM: 2.2 mg/dL (ref 1.5–2.5)

## 2016-06-19 LAB — CK: Total CK: 119 U/L (ref 7–177)

## 2016-06-19 MED ORDER — ALBUTEROL SULFATE HFA 108 (90 BASE) MCG/ACT IN AERS: 2.0000 | INHALATION_SPRAY | Freq: Four times a day (QID) | RESPIRATORY_TRACT | 5 refills | Status: DC | PRN

## 2016-06-19 MED ORDER — FLUTICASONE PROPIONATE 50 MCG/ACT NA SUSP: 2.0000 | Freq: Every day | NASAL | 6 refills | Status: DC

## 2016-06-19 NOTE — Progress Notes (Signed)
Pre visit review using our clinic review tool, if applicable. No additional management support is needed unless otherwise documented below in the visit note. 

## 2016-06-19 NOTE — Assessment & Plan Note (Signed)
?   Related to statin - check ck, mg Stop lipitor for 2-4 weeks to see if there is an improvement If no improvement may be related to back issues If resolves will switch to pravastatin

## 2016-06-19 NOTE — Assessment & Plan Note (Signed)
Having allergy symptoms Start zyrtec and flonase

## 2016-06-19 NOTE — Assessment & Plan Note (Signed)
Check lipid panel  Continue daily statin Regular exercise and healthy diet encouraged  

## 2016-06-19 NOTE — Assessment & Plan Note (Signed)
Dry cough since smoke exposure at easter - this has occurred in the past Albuterol inhaler as needed - this has helped in the past Call if no improvement

## 2016-06-19 NOTE — Assessment & Plan Note (Signed)
Check labs - intermittent, chronic tsh, cbc, cmp

## 2016-06-19 NOTE — Assessment & Plan Note (Signed)
Controlled at home - did not take medication today due to stress this morning but always takes her medications Continue to monitor at home Continue current medication cmp, tsh

## 2016-06-19 NOTE — Assessment & Plan Note (Signed)
dexa up to date Stressed regular walking  Taking vitamin D High calcium foods

## 2016-06-19 NOTE — Assessment & Plan Note (Signed)
Check a1c 

## 2016-06-25 ENCOUNTER — Encounter: Payer: Self-pay | Admitting: Internal Medicine

## 2016-06-27 DIAGNOSIS — H25811 Combined forms of age-related cataract, right eye: Secondary | ICD-10-CM | POA: Diagnosis not present

## 2016-07-13 DIAGNOSIS — H401111 Primary open-angle glaucoma, right eye, mild stage: Secondary | ICD-10-CM | POA: Diagnosis not present

## 2016-07-13 DIAGNOSIS — I10 Essential (primary) hypertension: Secondary | ICD-10-CM | POA: Diagnosis not present

## 2016-07-13 DIAGNOSIS — Z79899 Other long term (current) drug therapy: Secondary | ICD-10-CM | POA: Diagnosis not present

## 2016-07-13 DIAGNOSIS — E7800 Pure hypercholesterolemia, unspecified: Secondary | ICD-10-CM | POA: Diagnosis not present

## 2016-07-13 DIAGNOSIS — H25811 Combined forms of age-related cataract, right eye: Secondary | ICD-10-CM | POA: Diagnosis not present

## 2016-07-13 DIAGNOSIS — Z7982 Long term (current) use of aspirin: Secondary | ICD-10-CM | POA: Diagnosis not present

## 2016-07-13 DIAGNOSIS — H2511 Age-related nuclear cataract, right eye: Secondary | ICD-10-CM | POA: Diagnosis not present

## 2016-07-24 DIAGNOSIS — H2512 Age-related nuclear cataract, left eye: Secondary | ICD-10-CM | POA: Diagnosis not present

## 2016-08-04 DIAGNOSIS — Z961 Presence of intraocular lens: Secondary | ICD-10-CM | POA: Diagnosis not present

## 2016-08-04 DIAGNOSIS — I10 Essential (primary) hypertension: Secondary | ICD-10-CM | POA: Diagnosis not present

## 2016-08-04 DIAGNOSIS — H2512 Age-related nuclear cataract, left eye: Secondary | ICD-10-CM | POA: Diagnosis not present

## 2016-08-04 DIAGNOSIS — E7800 Pure hypercholesterolemia, unspecified: Secondary | ICD-10-CM | POA: Diagnosis not present

## 2016-08-04 DIAGNOSIS — H401121 Primary open-angle glaucoma, left eye, mild stage: Secondary | ICD-10-CM | POA: Diagnosis not present

## 2016-08-04 DIAGNOSIS — Z79899 Other long term (current) drug therapy: Secondary | ICD-10-CM | POA: Diagnosis not present

## 2016-08-04 DIAGNOSIS — Z9841 Cataract extraction status, right eye: Secondary | ICD-10-CM | POA: Diagnosis not present

## 2016-08-04 DIAGNOSIS — Z7982 Long term (current) use of aspirin: Secondary | ICD-10-CM | POA: Diagnosis not present

## 2016-09-04 ENCOUNTER — Other Ambulatory Visit: Payer: Self-pay | Admitting: Internal Medicine

## 2016-11-08 ENCOUNTER — Other Ambulatory Visit: Payer: Self-pay | Admitting: Internal Medicine

## 2016-11-08 MED ORDER — ATORVASTATIN CALCIUM 40 MG PO TABS: 40.0000 mg | ORAL_TABLET | Freq: Every day | ORAL | 0 refills | Status: DC

## 2016-11-20 NOTE — Progress Notes (Deleted)
Subjective:    Patient ID: Shelley Hayes, female    DOB: 01/29/1946, 71 y.o.   MRN: [id number]  HPI She is here for an acute visit.   Chest pain/ left breast pain    Medications and allergies reviewed with patient and updated if appropriate.  Patient Active Problem List   Diagnosis Date Noted  . Hyperglycemia 06/19/2016  . Muscle cramps 06/19/2016  . Cough 06/19/2016  . Osteopenia 04/26/2016  . Palpitations 12/23/2015  . Cervical paraspinal muscle spasm 12/23/2015  . SCIATICA, LEFT 01/20/2010  . Hyperlipidemia 08/23/2009  . GLUCOMA 08/23/2009  . Essential hypertension 08/23/2009  . Allergic rhinitis 08/23/2009  . ARTHRITIS 08/23/2009  . KNEE PAIN 08/23/2009    Current Outpatient Prescriptions on File Prior to Visit  Medication Sig Dispense Refill  . albuterol (PROVENTIL HFA;VENTOLIN HFA) 108 (90 Base) MCG/ACT inhaler Inhale 2 puffs into the lungs every 6 (six) hours as needed for wheezing or shortness of breath. 1 Inhaler 5  . ALPRAZolam (XANAX) 0.5 MG tablet Take 1 tablet (0.5 mg total) by mouth 2 (two) times daily as needed for anxiety or sleep. 60 tablet 1  . amLODipine (NORVASC) 10 MG tablet TAKE 1 TABLET BY MOUTH EVERY DAY 90 tablet 3  . aspirin 81 MG tablet Take 81 mg by mouth daily.      Marland Kitchen atorvastatin (LIPITOR) 40 MG tablet Take 1 tablet (40 mg total) by mouth daily. 90 tablet 0  . Cholecalciferol (VITAMIN D3) 1000 UNITS CAPS Take 1 capsule (1,000 Units total) by mouth daily. 30 capsule   . fluticasone (FLONASE) 50 MCG/ACT nasal spray Place 2 sprays into both nostrils daily. 16 g 6  . hydrochlorothiazide (MICROZIDE) 12.5 MG capsule Take 1 capsule (12.5 mg total) by mouth daily. Yearly physical due in Oct must have appt for future refills 90 capsule 2  . latanoprost (XALATAN) 0.005 % ophthalmic solution Place 1 drop into both eyes at bedtime.  6  . Multiple Vitamin (MULTIVITAMIN) tablet Take 1 tablet by mouth daily.      . valACYclovir (VALTREX) 500 MG tablet Take  1 tablet (500 mg total) by mouth daily. 90 tablet 1  . vitamin B-12 (CYANOCOBALAMIN) 1000 MCG tablet Take 1 tablet (1,000 mcg total) by mouth daily.     No current facility-administered medications on file prior to visit.     Past Medical History:  Diagnosis Date  . ALLERGIC RHINITIS   . ARTHRITIS   . GLUCOMA   . HYPERLIPIDEMIA   . HYPERTENSION   . SCIATICA, LEFT 11/2010    Past Surgical History:  Procedure Laterality Date  . TUBAL LIGATION      Social History   Social History  . Marital status: Divorced    Spouse name: N/A  . Number of children: N/A  . Years of education: N/A   Social History Main Topics  . Smoking status: Never Smoker  . Smokeless tobacco: Never Used  . Alcohol use No  . Drug use: No  . Sexual activity: Not on file   Other Topics Concern  . Not on file   Social History Narrative   Divorced, lives alone- in [place] for 86yrs but moved "home" to [place] in 2008      Walking regularly, every morning    Family History  Problem Relation Age of Onset  . Arthritis Mother   . Diabetes Mother   . Colon cancer Mother 32  . Arthritis Father   . Alcohol abuse Brother   .  Lung cancer Brother   . Prostate cancer Brother   . Diabetes Brother   . Hypertension Brother   . Lung cancer Other        Neice    Review of Systems     Objective:  There were no vitals filed for this visit. There were no vitals filed for this visit. There is no height or weight on file to calculate BMI.  Wt Readings from Last 3 Encounters:  06/19/16 203 lb (92.1 kg)  02/29/16 193 lb (87.5 kg)  12/23/15 195 lb (88.5 kg)     Physical Exam        Assessment & Plan:    See Problem List for Assessment and Plan of chronic medical problems.

## 2016-11-21 ENCOUNTER — Ambulatory Visit: Payer: Medicare Other | Admitting: Internal Medicine

## 2016-11-22 ENCOUNTER — Ambulatory Visit: Payer: Self-pay | Admitting: Internal Medicine

## 2017-01-23 ENCOUNTER — Ambulatory Visit: Payer: Medicare Other

## 2017-01-30 ENCOUNTER — Ambulatory Visit: Payer: Medicare Other

## 2017-02-06 ENCOUNTER — Ambulatory Visit (INDEPENDENT_AMBULATORY_CARE_PROVIDER_SITE_OTHER): Payer: Medicare Other

## 2017-02-06 DIAGNOSIS — Z23 Encounter for immunization: Secondary | ICD-10-CM

## 2017-02-14 ENCOUNTER — Other Ambulatory Visit: Payer: Self-pay | Admitting: Internal Medicine

## 2017-02-18 ENCOUNTER — Other Ambulatory Visit: Payer: Self-pay | Admitting: Internal Medicine

## 2017-02-22 ENCOUNTER — Other Ambulatory Visit: Payer: Self-pay | Admitting: Internal Medicine

## 2017-02-27 DIAGNOSIS — H401131 Primary open-angle glaucoma, bilateral, mild stage: Secondary | ICD-10-CM | POA: Diagnosis not present

## 2017-03-05 ENCOUNTER — Other Ambulatory Visit: Payer: Self-pay | Admitting: Internal Medicine

## 2017-03-08 ENCOUNTER — Ambulatory Visit: Payer: Medicare Other

## 2017-03-29 ENCOUNTER — Ambulatory Visit: Payer: Medicare Other

## 2017-04-19 ENCOUNTER — Ambulatory Visit: Payer: Medicare Other

## 2017-04-23 ENCOUNTER — Ambulatory Visit: Payer: Self-pay

## 2017-04-23 NOTE — Telephone Encounter (Signed)
Pt c/o intermittent "heart fluttering" that began 1 month ago. Pt states she feels like it gets better after 20-30 second. Pt states she is having very short episodes of chest pain between her breast. States episode started when she was reaching for a remote control. She states that the pain lasts a few seconds and occasionally goes to lower jaw and to left arm as a "tingling" feeling and lower jaw.  Pt states pain sometimes goes away after burping. Pt takes BP daily and BP was 123/81 with HR 76. Pt's HR 88 this am. Pt attributes palpitations to her BP med. She states that the pill she is taking looks different but the pharmacist told her that it was the same med but a different manufacturer. Advised pt to be seen today, but pt wants to wait until tomorrow due to the weather. Care advice given and appt made for tomorrow with PCP at 3:00 pm. Advised pt to go tto [facility] if palpitations or chest pain worsen.   Reason for Disposition . [1] Palpitations AND [2] no improvement after following Care Advice  Answer Assessment - Initial Assessment Questions 1. DESCRIPTION: "Please describe your heart rate or heart beat that you are having" (e.g., fast/slow, regular/irregular, skipped or extra beats, "palpitations")     palpitations 2. ONSET: "When did it start?" (Minutes, hours or days)      1 month 3. DURATION: "How long does it last" (e.g., seconds, minutes, hours)     20-30 seconds 4. PATTERN "Does it come and go, or has it been constant since it started?"  "Does it get worse with exertion?"   "Are you feeling it now?"     Comes and goes -does not get worse with activity 5. TAP: "Using your hand, can you tap out what you are feeling on a chair or table in front of you, so that I can hear?" (Note: not all patients can do this)       Feels like the heart rate is regular but the heart is fluttering 6. HEART RATE: "Can you tell me your heart rate?" "How many beats in 15 seconds?"  (Note: not all patients can do  this)       22 7. RECURRENT SYMPTOM: "Have you ever had this before?" If so, ask: "When was the last time?" and "What happened that time?"      Yes have had this before- last time it went away on its own but is coming with more frequency- goes away on it's own 8. CAUSE: "What do you think is causing the palpitations?"     Maybe BP medicine 9. CARDIAC HISTORY: "Do you have any history of heart disease?" (e.g., heart attack, angina, bypass surgery, angioplasty, arrhythmia)      no 10. OTHER SYMPTOMS: "Do you have any other symptoms?" (e.g., dizziness, chest pain, sweating, difficulty breathing)       "feels ike water is in my head"-"occasional indigestion and sharp pain between breasts that last a few seconds with occasional radiation to left arm and left jaw" feels like tingling and pain to left  11. PREGNANCY: "Is there any chance you are pregnant?" "When was your last menstrual period?"       n/a  Protocols used: HEART RATE AND HEARTBEAT QUESTIONS-A-AH

## 2017-04-23 NOTE — Progress Notes (Signed)
Subjective:    Patient ID: Shelley Hayes, female    DOB: 1946/02/04, 72 y.o.   MRN: [id number]  HPI The patient is here for an acute visit.  Heart fluttering, chest pain:  She has had intermittent heart fluttering over one month and intermittent chest pain over one month.   When the manufacturer for her amlodipine changed she feels this symptoms started.  She tried not taking the medication on a couple of occasions and she thinks that may have helped.  She did take the medication this morning because she was afraid her blood pressure would be too high without it.  The palpitations/ fluttering last a couple of seconds. The chest pain occurs at rest or with activity and lasts a few seconds.  Taking deep breaths helps.  With the palpations she has tightening in the left face/jaw and a pain in her left arm down to her elbow.    She denies heartburn but her food feels like it is stuck in bottom esophagus.     Rash on back: it was itchy and painful.  It started over a month ago.  It is almost healed.  She described it as a burning type sensation and itching.  She was not seen for the rash.  She was applying topical ointments on it.  Blood in stool:  She had one episode of blood in her stool and when she wiped.  She also had blood after urinating one month ago when she wiped, which occurred at the same time. She denies any other episodes.  She does have a history of colon polyps.  Her last colonoscopy was sometime between 2007 in 2009.  This colonoscopy was done in [place] and neither her nor I have any record of it.  She does not think she would be able to get a record of it.  Anxiety :  She takes the xanax only as needed.  She has not taken it in a while.  There has been some increased stress in her family and she would like another prescription.  She really took it when she did have it.  Medications and allergies reviewed with patient and updated if appropriate.  Patient Active Problem List   Diagnosis Date Noted  . Hyperglycemia 06/19/2016  . Muscle cramps 06/19/2016  . Cough 06/19/2016  . Osteopenia 04/26/2016  . Palpitations 12/23/2015  . Cervical paraspinal muscle spasm 12/23/2015  . SCIATICA, LEFT 01/20/2010  . Hyperlipidemia 08/23/2009  . GLUCOMA 08/23/2009  . Essential hypertension 08/23/2009  . Allergic rhinitis 08/23/2009  . ARTHRITIS 08/23/2009  . KNEE PAIN 08/23/2009    Current Outpatient Medications on File Prior to Visit  Medication Sig Dispense Refill  . albuterol (PROVENTIL HFA;VENTOLIN HFA) 108 (90 Base) MCG/ACT inhaler Inhale 2 puffs into the lungs every 6 (six) hours as needed for wheezing or shortness of breath. 1 Inhaler 5  . ALPRAZolam (XANAX) 0.5 MG tablet Take 1 tablet (0.5 mg total) by mouth 2 (two) times daily as needed for anxiety or sleep. 60 tablet 1  . aspirin 81 MG tablet Take 81 mg by mouth daily.      Marland Kitchen atorvastatin (LIPITOR) 40 MG tablet Take 1 tablet (40 mg total) by mouth daily. ---Office visit needed for further refills 30 tablet 0  . atorvastatin (LIPITOR) 40 MG tablet TAKE 1 TABLET BY MOUTH EVERY DAY 90 tablet 0  . Cholecalciferol (VITAMIN D3) 1000 UNITS CAPS Take 1 capsule (1,000 Units total) by mouth daily. 30 capsule   .  fluticasone (FLONASE) 50 MCG/ACT nasal spray Place 2 sprays into both nostrils daily. 16 g 6  . hydrochlorothiazide (MICROZIDE) 12.5 MG capsule Take 1 capsule (12.5 mg total) by mouth daily. -- Office visit needed for further refills 30 capsule 0  . hydrochlorothiazide (MICROZIDE) 12.5 MG capsule Take 1 capsule (12.5 mg total) by mouth daily. Annual appt due in April must see provider for future refills 90 capsule 0  . latanoprost (XALATAN) 0.005 % ophthalmic solution Place 1 drop into both eyes at bedtime.  6  . Multiple Vitamin (MULTIVITAMIN) tablet Take 1 tablet by mouth daily.      . valACYclovir (VALTREX) 500 MG tablet Take 1 tablet (500 mg total) by mouth daily. 90 tablet 1  . vitamin B-12 (CYANOCOBALAMIN) 1000  MCG tablet Take 1 tablet (1,000 mcg total) by mouth daily.    Marland Kitchen amLODipine (NORVASC) 10 MG tablet TAKE 1 TABLET BY MOUTH EVERY DAY (Patient not taking: Reported on 04/24/2017) 90 tablet 3   No current facility-administered medications on file prior to visit.     Past Medical History:  Diagnosis Date  . ALLERGIC RHINITIS   . ARTHRITIS   . GLUCOMA   . HYPERLIPIDEMIA   . HYPERTENSION   . SCIATICA, LEFT 11/2010    Past Surgical History:  Procedure Laterality Date  . TUBAL LIGATION      Social History   Socioeconomic History  . Marital status: Divorced    Spouse name: Not on file  . Number of children: Not on file  . Years of education: Not on file  . Highest education level: Not on file  Social Needs  . Financial resource strain: Not on file  . Food insecurity - worry: Not on file  . Food insecurity - inability: Not on file  . Transportation needs - medical: Not on file  . Transportation needs - non-medical: Not on file  Occupational History  . Not on file  Tobacco Use  . Smoking status: Never Smoker  . Smokeless tobacco: Never Used  Substance and Sexual Activity  . Alcohol use: No    Alcohol/week: 0.0 oz  . Drug use: No  . Sexual activity: Not on file  Other Topics Concern  . Not on file  Social History Narrative   Divorced, lives alone- in [place] for 64yrs but moved "home" to [place] in 2008      Walking regularly, every morning    Family History  Problem Relation Age of Onset  . Arthritis Mother   . Diabetes Mother   . Colon cancer Mother 35  . Arthritis Father   . Alcohol abuse Brother   . Lung cancer Brother   . Prostate cancer Brother   . Diabetes Brother   . Hypertension Brother   . Lung cancer Other        Neice    Review of Systems  Constitutional: Negative for chills and fever.  Respiratory: Negative for cough, shortness of breath and wheezing.   Cardiovascular: Positive for chest pain and palpitations. Negative for leg swelling.  Neurological:  Positive for headaches (intermittent feeling of water in her head). Negative for light-headedness.       Objective:   Vitals:   04/24/17 1458  BP: 136/84  Pulse: 94  Resp: 16  Temp: 98.9 F (37.2 C)  SpO2: 96%   Wt Readings from Last 3 Encounters:  04/24/17 198 lb (89.8 kg)  06/19/16 203 lb (92.1 kg)  02/29/16 193 lb (87.5 kg)   Body mass  index is 42.85 kg/m.   Physical Exam    Constitutional: Appears well-developed and well-nourished. No distress.  HENT:  Head: Normocephalic and atraumatic.  Neck: Neck supple. No tracheal deviation present. No thyromegaly present.  No cervical lymphadenopathy Cardiovascular: Normal rate, regular rhythm and normal heart sounds.   No murmur heard. No carotid bruit .  No edema Pulmonary/Chest: Effort normal and breath sounds normal. No respiratory distress. No has no wheezes. No rales.  Skin: Skin is warm and dry. Not diaphoretic. Cluster of healing vesicles/blisters left middle back and your vertebrae, no other rash evident Psychiatric: Normal mood and affect. Behavior is normal.       Assessment & Plan:    See Problem List for Assessment and Plan of chronic medical problems.

## 2017-04-24 ENCOUNTER — Other Ambulatory Visit (INDEPENDENT_AMBULATORY_CARE_PROVIDER_SITE_OTHER): Payer: Medicare Other

## 2017-04-24 ENCOUNTER — Encounter: Payer: Self-pay | Admitting: Internal Medicine

## 2017-04-24 ENCOUNTER — Ambulatory Visit (INDEPENDENT_AMBULATORY_CARE_PROVIDER_SITE_OTHER): Payer: Medicare Other | Admitting: Internal Medicine

## 2017-04-24 VITALS — BP 136/84 | HR 94 | Temp 98.9°F | Resp 16 | Wt 198.0 lb

## 2017-04-24 DIAGNOSIS — K921 Melena: Secondary | ICD-10-CM | POA: Diagnosis not present

## 2017-04-24 DIAGNOSIS — R7303 Prediabetes: Secondary | ICD-10-CM

## 2017-04-24 DIAGNOSIS — I498 Other specified cardiac arrhythmias: Secondary | ICD-10-CM

## 2017-04-24 DIAGNOSIS — Z1211 Encounter for screening for malignant neoplasm of colon: Secondary | ICD-10-CM

## 2017-04-24 DIAGNOSIS — F419 Anxiety disorder, unspecified: Secondary | ICD-10-CM | POA: Insufficient documentation

## 2017-04-24 DIAGNOSIS — R079 Chest pain, unspecified: Secondary | ICD-10-CM | POA: Insufficient documentation

## 2017-04-24 DIAGNOSIS — R739 Hyperglycemia, unspecified: Secondary | ICD-10-CM

## 2017-04-24 DIAGNOSIS — B029 Zoster without complications: Secondary | ICD-10-CM

## 2017-04-24 HISTORY — DX: Chest pain, unspecified: R07.9

## 2017-04-24 HISTORY — DX: Zoster without complications: B02.9

## 2017-04-24 HISTORY — DX: Melena: K92.1

## 2017-04-24 HISTORY — DX: Prediabetes: R73.03

## 2017-04-24 LAB — COMPREHENSIVE METABOLIC PANEL
ALK PHOS: 74 U/L (ref 39–117)
ALT: 15 U/L (ref 0–35)
AST: 20 U/L (ref 0–37)
Albumin: 4.3 g/dL (ref 3.5–5.2)
BILIRUBIN TOTAL: 0.6 mg/dL (ref 0.2–1.2)
BUN: 11 mg/dL (ref 6–23)
CO2: 31 meq/L (ref 19–32)
Calcium: 9.7 mg/dL (ref 8.4–10.5)
Chloride: 105 mEq/L (ref 96–112)
Creatinine, Ser: 0.82 mg/dL (ref 0.40–1.20)
GFR: 88.23 mL/min (ref >60.00)
GLUCOSE: 98 mg/dL (ref 70–99)
Potassium: 3.5 mEq/L (ref 3.5–5.1)
SODIUM: 146 meq/L — AB (ref 135–145)
TOTAL PROTEIN: 8.1 g/dL (ref 6.0–8.3)

## 2017-04-24 LAB — CBC WITH DIFFERENTIAL/PLATELET
BASOS ABS: 0 10*3/uL (ref 0.0–0.1)
BASOS PCT: 0.4 % (ref 0.0–3.0)
EOS ABS: 0.1 10*3/uL (ref 0.0–0.7)
Eosinophils Relative: 1.1 % (ref 0.0–5.0)
HEMATOCRIT: 40.6 % (ref 36.0–46.0)
HEMOGLOBIN: 13.4 g/dL (ref 12.0–15.0)
LYMPHS PCT: 27 % (ref 12.0–46.0)
Lymphs Abs: 2.3 10*3/uL (ref 0.7–4.0)
MCHC: 33.1 g/dL (ref 30.0–36.0)
MCV: 86.3 fl (ref 78.0–100.0)
Monocytes Absolute: 0.8 10*3/uL (ref 0.1–1.0)
Monocytes Relative: 9.3 % (ref 3.0–12.0)
Neutro Abs: 5.2 10*3/uL (ref 1.4–7.7)
Neutrophils Relative %: 62.2 % (ref 43.0–77.0)
Platelets: 331 10*3/uL (ref 150.0–400.0)
RBC: 4.7 Mil/uL (ref 3.87–5.11)
RDW: 14.1 % (ref 11.5–15.5)
WBC: 8.4 10*3/uL (ref 4.0–10.5)

## 2017-04-24 LAB — HEMOGLOBIN A1C: HEMOGLOBIN A1C: 5.4 % (ref 4.6–6.5)

## 2017-04-24 LAB — TSH: TSH: 0.98 u[IU]/mL (ref 0.35–4.50)

## 2017-04-24 MED ORDER — ALPRAZOLAM 0.5 MG PO TABS: 0.5000 mg | ORAL_TABLET | Freq: Two times a day (BID) | ORAL | 0 refills | Status: DC | PRN

## 2017-04-24 MED ORDER — OLMESARTAN MEDOXOMIL 20 MG PO TABS: 20.0000 mg | ORAL_TABLET | Freq: Every day | ORAL | 5 refills | Status: DC

## 2017-04-24 NOTE — Assessment & Plan Note (Signed)
Residual rash on left middle back consistent with shingles At this point the rash is healing and she is tolerating the pain Recommended getting the shingles vaccine, but waiting several months before this

## 2017-04-24 NOTE — Patient Instructions (Addendum)
  Test(s) ordered today. Your results will be released to [facility] (or called to you) after review, usually within 72hours after test completion. If any changes need to be made, you will be notified at that same time.    Medications reviewed and updated.  Changes include stopping the amlodipine and starting olmesartan for your blood pressure.    Your prescription(s) have been submitted to your pharmacy. Please take as directed and contact our office if you believe you are having problem(s) with the medication(s).  A referral was ordered for cardiology and gastroenterology  Please followup on April 15th

## 2017-04-24 NOTE — Assessment & Plan Note (Signed)
Situational anxiety, does not occur often Has taken Xanax in the past would like another prescription to take only as needed Refill today Discussed that this medication is potentially addicting and can be abused to only take as needed

## 2017-04-24 NOTE — Assessment & Plan Note (Signed)
We will check A1c ?

## 2017-04-24 NOTE — Assessment & Plan Note (Signed)
EKG today shows sinus rhythm with nonspecific T wave abnormality which is unchanged from prior EKGs Intermittent palpitations are short lived and she feels they are related to amlodipine so we will go ahead and change that-the manufacturer changed and this is when she started having symptoms Will check blood work to rule out other causes including CMP, CBC, TSH Given palpitations with some tightness in her jaw and left arm pain in addition to atypical chest pain will refer to cardiology

## 2017-04-24 NOTE — Assessment & Plan Note (Signed)
Chest pain has been intermittent for the past month and is atypical Occurring in her central chest and is a sharp pain that lasts a few seconds.  Not related with specific activity Given that she is also experiencing palpitations I will refer to cardiology for further evaluation

## 2017-04-24 NOTE — Assessment & Plan Note (Signed)
One episode only Her last colonoscopy was over 10 years ago and she is due Will refer to GI for colonoscopy given history of colon polyps, last colonoscopy more than 10 years ago and an episode of blood in her stool

## 2017-04-25 ENCOUNTER — Encounter: Payer: Self-pay | Admitting: Internal Medicine

## 2017-05-09 ENCOUNTER — Encounter: Payer: Self-pay | Admitting: Gastroenterology

## 2017-05-10 DIAGNOSIS — Z6841 Body Mass Index (BMI) 40.0 and over, adult: Secondary | ICD-10-CM | POA: Diagnosis not present

## 2017-05-10 DIAGNOSIS — Z01419 Encounter for gynecological examination (general) (routine) without abnormal findings: Secondary | ICD-10-CM | POA: Diagnosis not present

## 2017-05-10 DIAGNOSIS — Z1231 Encounter for screening mammogram for malignant neoplasm of breast: Secondary | ICD-10-CM | POA: Diagnosis not present

## 2017-05-14 ENCOUNTER — Other Ambulatory Visit: Payer: Self-pay | Admitting: Obstetrics and Gynecology

## 2017-05-14 DIAGNOSIS — R928 Other abnormal and inconclusive findings on diagnostic imaging of breast: Secondary | ICD-10-CM

## 2017-05-16 ENCOUNTER — Ambulatory Visit
Admission: RE | Admit: 2017-05-16 | Discharge: 2017-05-16 | Disposition: A | Discharge status: Home / Self Care | Payer: Medicare Other | Source: Ambulatory Visit | Attending: Obstetrics and Gynecology | Admitting: Obstetrics and Gynecology

## 2017-05-16 ENCOUNTER — Other Ambulatory Visit: Payer: Self-pay | Admitting: Obstetrics and Gynecology

## 2017-05-16 DIAGNOSIS — R928 Other abnormal and inconclusive findings on diagnostic imaging of breast: Secondary | ICD-10-CM

## 2017-05-16 DIAGNOSIS — N6310 Unspecified lump in the right breast, unspecified quadrant: Secondary | ICD-10-CM

## 2017-05-16 DIAGNOSIS — N6311 Unspecified lump in the right breast, upper outer quadrant: Secondary | ICD-10-CM | POA: Diagnosis not present

## 2017-05-18 ENCOUNTER — Ambulatory Visit
Admission: RE | Admit: 2017-05-18 | Discharge: 2017-05-18 | Disposition: A | Discharge status: Home / Self Care | Payer: Medicare Other | Source: Ambulatory Visit | Attending: Obstetrics and Gynecology | Admitting: Obstetrics and Gynecology

## 2017-05-18 ENCOUNTER — Other Ambulatory Visit: Payer: Self-pay | Admitting: Obstetrics and Gynecology

## 2017-05-18 DIAGNOSIS — N6310 Unspecified lump in the right breast, unspecified quadrant: Secondary | ICD-10-CM

## 2017-05-18 DIAGNOSIS — N6311 Unspecified lump in the right breast, upper outer quadrant: Secondary | ICD-10-CM | POA: Diagnosis not present

## 2017-05-18 DIAGNOSIS — N6011 Diffuse cystic mastopathy of right breast: Secondary | ICD-10-CM | POA: Diagnosis not present

## 2017-05-22 ENCOUNTER — Other Ambulatory Visit: Payer: Self-pay | Admitting: Internal Medicine

## 2017-05-29 ENCOUNTER — Other Ambulatory Visit: Payer: Medicare Other

## 2017-06-03 ENCOUNTER — Other Ambulatory Visit: Payer: Self-pay | Admitting: Internal Medicine

## 2017-06-12 NOTE — Progress Notes (Deleted)
Cardiology Office Note   Date:  06/12/2017   ID:  Shelley Hayes, DOB 10-06-1945, MRN [id number]  PCP:  Binnie Rail, MD  Cardiologist:   Jenkins Rouge, MD   No chief complaint on file.     History of Present Illness: Shelley Hayes is a 72 y.o. female who presents for consultation regarding chest pain and palpitations. CRF;s include HTN and HLD She has had normal nuclear studies in 2011 and August of 2015 with EF 72% Reviewed Dr Quay Burow office note from 04/24/17 Over the last month patient has had fluttering in chest and pain. Symptoms started when he norvasc pill was changed by manufacturer. When she omitted the pill symptoms improved  It was d/c on office visit and benicar 20 mg was substituted.  Pain is atypical center of chest sharp lasts a few seconds.  Not related to exertion***    She also complained of rash on her back thought to be shingles healing and no Rx given, blood on toilet paper when she wiped on one occasion. She was referred to GI for colonoscopy with history of polyps 10 years ago.  She has some anxiety and uses xanax   Labs 04/24/17 reviewed Na 146 K 3.5  Hct 40.6 TSH .98  Holter monitor ordered but is pending   Past Medical History:  Diagnosis Date  . ALLERGIC RHINITIS   . ARTHRITIS   . GLUCOMA   . HYPERLIPIDEMIA   . HYPERTENSION   . SCIATICA, LEFT 11/2010    Past Surgical History:  Procedure Laterality Date  . TUBAL LIGATION       Current Outpatient Medications  Medication Sig Dispense Refill  . albuterol (PROVENTIL HFA;VENTOLIN HFA) 108 (90 Base) MCG/ACT inhaler Inhale 2 puffs into the lungs every 6 (six) hours as needed for wheezing or shortness of breath. 1 Inhaler 5  . ALPRAZolam (XANAX) 0.5 MG tablet Take 1 tablet (0.5 mg total) by mouth 2 (two) times daily as needed for anxiety or sleep. 30 tablet 0  . aspirin 81 MG tablet Take 81 mg by mouth daily.      Marland Kitchen atorvastatin (LIPITOR) 40 MG tablet TAKE 1 TABLET BY MOUTH EVERY DAY 90 tablet 3  .  Cholecalciferol (VITAMIN D3) 1000 UNITS CAPS Take 1 capsule (1,000 Units total) by mouth daily. 30 capsule   . fluticasone (FLONASE) 50 MCG/ACT nasal spray Place 2 sprays into both nostrils daily. 16 g 6  . hydrochlorothiazide (MICROZIDE) 12.5 MG capsule Take 1 capsule (12.5 mg total) by mouth daily. 90 capsule 0  . latanoprost (XALATAN) 0.005 % ophthalmic solution Place 1 drop into both eyes at bedtime.  6  . Multiple Vitamin (MULTIVITAMIN) tablet Take 1 tablet by mouth daily.      Marland Kitchen olmesartan (BENICAR) 20 MG tablet Take 1 tablet (20 mg total) by mouth daily. 30 tablet 5  . valACYclovir (VALTREX) 500 MG tablet Take 1 tablet (500 mg total) by mouth daily. 90 tablet 1  . vitamin B-12 (CYANOCOBALAMIN) 1000 MCG tablet Take 1 tablet (1,000 mcg total) by mouth daily.     No current facility-administered medications for this visit.     Allergies:   Hydrocodone-homatropine and Losartan    Social History:  The patient  reports that she has never smoked. She has never used smokeless tobacco. She reports that she does not drink alcohol or use drugs.   Family History:  The patient's family history includes Alcohol abuse in her brother; Arthritis in her father  and mother; Colon cancer (age of onset: 66) in her mother; Diabetes in her brother and mother; Hypertension in her brother; Lung cancer in her brother and other; Prostate cancer in her brother.    ROS:  Please see the history of present illness.   Otherwise, review of systems are positive for {NONE DEFAULTED:18576::"none"}.   All other systems are reviewed and negative.    PHYSICAL EXAM: VS:  There were no vitals taken for this visit. , BMI There is no height or weight on file to calculate BMI. Affect appropriate Healthy:  appears stated age 66: normal Neck supple with no adenopathy JVP normal no bruits no thyromegaly Lungs clear with no wheezing and good diaphragmatic motion Heart:  S1/S2 no murmur, no rub, gallop or click PMI  normal Abdomen: benighn, BS positve, no tenderness, no AAA no bruit.  No HSM or HJR Distal pulses intact with no bruits No edema Neuro non-focal Skin warm and dry No muscular weakness    EKG:  SR rate 89 nonspecific ST changes no change from 12/23/15    Recent Labs: 06/19/2016: Magnesium 2.2 04/24/2017: ALT 15; BUN 11; Creatinine, Ser 0.82; Hemoglobin 13.4; Platelets 331.0; Potassium 3.5; Sodium 146; TSH 0.98    Lipid Panel    Component Value Date/Time   CHOL 208 (H) 06/19/2016 1642   TRIG 85.0 06/19/2016 1642   HDL 45.20 06/19/2016 1642   CHOLHDL 5 06/19/2016 1642   VLDL 17.0 06/19/2016 1642   LDLCALC 146 (H) 06/19/2016 1642   LDLDIRECT 198.9 12/30/2012 1037      Wt Readings from Last 3 Encounters:  04/24/17 198 lb (89.8 kg)  06/19/16 203 lb (92.1 kg)  02/29/16 193 lb (87.5 kg)      Other studies Reviewed: Additional studies/ records that were reviewed today include: Office notes primary , labs, ECG;s myovue from 2011 and 2015 .    ASSESSMENT AND PLAN:  1.  Palpitations:  holter ordered ECG NSR *** 2. Chest pain: atypical two previously normal myovue studies ECG no acute changes *** 3. HTN:  Well controlled.  Continue current medications and low sodium Dash type diet.   4. HLD continue lipitor LFTls normal  5. Anxiety:  PRN xanax 6. Polyps: history of referred back to GI for repeat given blood in stool 7. Rash resolving recommended shingles vaccine in a few months after healing    Current medicines are reviewed at length with the patient today.  The patient does not have concerns regarding medicines.  The following changes have been made:  no change  Labs/ tests ordered today include: *** No orders of the defined types were placed in this encounter.    Disposition:   FU with cardiology PRN      Signed, Jenkins Rouge, MD  06/12/2017 1:09 PM    [facility] [place], [address], [place]  [address] Phone: [phone]; Fax:  [phone]

## 2017-06-15 ENCOUNTER — Ambulatory Visit: Payer: Medicare Other | Admitting: Cardiovascular Disease

## 2017-06-20 ENCOUNTER — Ambulatory Visit (INDEPENDENT_AMBULATORY_CARE_PROVIDER_SITE_OTHER): Payer: Medicare Other | Admitting: Gastroenterology

## 2017-06-20 ENCOUNTER — Encounter: Payer: Self-pay | Admitting: Gastroenterology

## 2017-06-20 VITALS — BP 120/76 | HR 84 | Ht <= 58 in | Wt 197.0 lb

## 2017-06-20 DIAGNOSIS — K921 Melena: Secondary | ICD-10-CM

## 2017-06-20 DIAGNOSIS — K5900 Constipation, unspecified: Secondary | ICD-10-CM | POA: Diagnosis not present

## 2017-06-20 DIAGNOSIS — R1310 Dysphagia, unspecified: Secondary | ICD-10-CM | POA: Diagnosis not present

## 2017-06-20 DIAGNOSIS — K219 Gastro-esophageal reflux disease without esophagitis: Secondary | ICD-10-CM | POA: Diagnosis not present

## 2017-06-20 MED ORDER — OMEPRAZOLE 40 MG PO CPDR: 40.0000 mg | DELAYED_RELEASE_CAPSULE | Freq: Every day | ORAL | 11 refills | Status: DC

## 2017-06-20 MED ORDER — NA SULFATE-K SULFATE-MG SULF 17.5-3.13-1.6 GM/177ML PO SOLN: 1.0000 | Freq: Once | ORAL | 0 refills | Status: AC

## 2017-06-20 NOTE — Patient Instructions (Signed)
We have sent the following medications to your pharmacy for you to pick up at your convenience: omeprazole.   Patient advised to avoid spicy, acidic, citrus, chocolate, mints, fruit and fruit juices.  Limit the intake of caffeine, alcohol and Soda.  Don't exercise too soon after eating.  Don't lie down within 3-4 hours of eating.  Elevate the head of your bed.   You have been scheduled for an endoscopy and colonoscopy. Please follow the written instructions given to you at your visit today. Please pick up your prep supplies at the pharmacy within the next 1-3 days. If you use inhalers (even only as needed), please bring them with you on the day of your procedure. Your physician has requested that you go to www.startemmi.com and enter the access code given to you at your visit today. This web site gives a general overview about your procedure. However, you should still follow specific instructions given to you by our office regarding your preparation for the procedure.  Normal BMI (Body Mass Index- based on height and weight) is between 23 and 30. Your BMI today is Body mass index is 42.63 kg/m. Marland Kitchen Please consider follow up  regarding your BMI with your Primary Care Provider.  Thank you for choosing me and [facility].  Pricilla Riffle. Dagoberto Ligas., MD., Marval Regal

## 2017-06-20 NOTE — Progress Notes (Signed)
History of Present Illness: This is a 72 year old female referred by Binnie Rail, MD for the evaluation of constipation, hematochezia, GERD, dysphagia.  She relates frequent reflux symptoms and intermittent solid food dysphagia that has progressively worsened for the past several months.  She took a 2-week course of omeprazole and all symptoms improved.  For the past 6 months she has had problems with constipation and has needed to use a laxative frequently.  Prior bowel pattern was about 3 bowel movements per day and now it is about 2 bowel movements per week.  She states she had several colonoscopies done in the past with the last colonoscopy done in 2009 in [place].  She recalls being told of 9 colon polyps on her last colonoscopy and recalls her recommendation to have a colonoscopy every 5 years.  She had one episode of rectal bleeding with a bowel movement 2 months ago that has not recurred.  CMP, CBC, TSH in 04/2017 unremarkable except elevated glucose. Denies weight loss, abdominal pain, diarrhea, change in stool caliber, melena, nausea, vomiting, chest pain.   Allergies  Allergen Reactions  . Hydrocodone-Homatropine Other (See Comments)    "don't give me that stuff - i'll get hooked!"  . Losartan Other (See Comments)    palpitations   Outpatient Medications Prior to Visit  Medication Sig Dispense Refill  . albuterol (PROVENTIL HFA;VENTOLIN HFA) 108 (90 Base) MCG/ACT inhaler Inhale 2 puffs into the lungs every 6 (six) hours as needed for wheezing or shortness of breath. 1 Inhaler 5  . ALPRAZolam (XANAX) 0.5 MG tablet Take 1 tablet (0.5 mg total) by mouth 2 (two) times daily as needed for anxiety or sleep. 30 tablet 0  . amLODipine (NORVASC) 10 MG tablet Take 10 mg by mouth daily.  3  . aspirin 81 MG tablet Take 81 mg by mouth daily.      Marland Kitchen atorvastatin (LIPITOR) 40 MG tablet TAKE 1 TABLET BY MOUTH EVERY DAY 90 tablet 3  . Cholecalciferol (VITAMIN D3) 1000 UNITS CAPS Take 1 capsule  (1,000 Units total) by mouth daily. 30 capsule   . fluticasone (FLONASE) 50 MCG/ACT nasal spray Place 2 sprays into both nostrils daily. 16 g 6  . hydrochlorothiazide (MICROZIDE) 12.5 MG capsule Take 1 capsule (12.5 mg total) by mouth daily. 90 capsule 0  . latanoprost (XALATAN) 0.005 % ophthalmic solution Place 1 drop into both eyes at bedtime.  6  . Multiple Vitamin (MULTIVITAMIN) tablet Take 1 tablet by mouth daily.      . vitamin B-12 (CYANOCOBALAMIN) 1000 MCG tablet Take 1 tablet (1,000 mcg total) by mouth daily.    Marland Kitchen olmesartan (BENICAR) 20 MG tablet Take 1 tablet (20 mg total) by mouth daily. 30 tablet 5  . valACYclovir (VALTREX) 500 MG tablet Take 1 tablet (500 mg total) by mouth daily. 90 tablet 1   No facility-administered medications prior to visit.    Past Medical History:  Diagnosis Date  . ALLERGIC RHINITIS   . Allergic rhinitis 08/23/2009  . ARTHRITIS   . Blood in stool 04/24/2017  . Cervical paraspinal muscle spasm 12/23/2015  . Chest pain 04/24/2017  . CHEST PAIN 12/19/2009   Qualifier: Diagnosis of  By: Marca Ancona RMA, Lucy    . COLONIC POLYPS, HX OF 08/23/2009   Qualifier: Diagnosis of  By: Marca Ancona RMA, Lucy    . Cough 06/19/2016  . Fluttering heart 12/23/2015  . GLUCOMA   . Herpes zoster without complication 1/61/0960  . Hyperglycemia 06/19/2016  .  HYPERLIPIDEMIA   . HYPERTENSION   . KNEE PAIN 08/23/2009   Qualifier: Diagnosis of  By: Asa Lente MD, Jannifer Rodney Muscle cramps 06/19/2016  . Osteopenia 04/26/2016   2/18 - dexa - RFN and LFN  -1.2  . OTITIS EXTERNA, RIGHT 01/20/2010   Qualifier: Diagnosis of  By: Asa Lente MD, Jannifer Rodney Prediabetes 04/24/2017  . SCIATICA, LEFT 11/2010   Past Surgical History:  Procedure Laterality Date  . BREAST BIOPSY    . TUBAL LIGATION     Social History   Socioeconomic History  . Marital status: Divorced    Spouse name: Not on file  . Number of children: 2  . Years of education: Not on file  . Highest education level: Not on file    Occupational History  . Occupation: Retired  Scientific laboratory technician  . Financial resource strain: Not on file  . Food insecurity:    Worry: Not on file    Inability: Not on file  . Transportation needs:    Medical: Not on file    Non-medical: Not on file  Tobacco Use  . Smoking status: Never Smoker  . Smokeless tobacco: Never Used  Substance and Sexual Activity  . Alcohol use: No    Alcohol/week: 0.0 oz  . Drug use: No  . Sexual activity: Not on file  Lifestyle  . Physical activity:    Days per week: Not on file    Minutes per session: Not on file  . Stress: Not on file  Relationships  . Social connections:    Talks on phone: Not on file    Gets together: Not on file    Attends religious service: Not on file    Active member of club or organization: Not on file    Attends meetings of clubs or organizations: Not on file    Relationship status: Not on file  Other Topics Concern  . Not on file  Social History Narrative   Divorced, lives alone- in [place] for 22yrs but moved "home" to [place] in 2008      Walking regularly, every morning   Family History  Problem Relation Age of Onset  . Arthritis Mother   . Diabetes Mother   . Colon cancer Mother 23  . Arthritis Father   . Alcohol abuse Brother   . Lung cancer Brother   . Prostate cancer Brother   . Diabetes Brother   . Hypertension Brother   . Lung cancer Other        Neice      Review of Systems: Pertinent positive and negative review of systems were noted in the above HPI section. All other review of systems were otherwise negative.    Physical Exam: General: Well developed, well nourished, no acute distress Head: Normocephalic and atraumatic Eyes:  sclerae anicteric, EOMI Ears: Normal auditory acuity Mouth: No deformity or lesions Neck: Supple, no masses or thyromegaly Lungs: Clear throughout to auscultation Heart: Regular rate and rhythm; no murmurs, rubs or bruits Abdomen: Soft, non tender and non distended.  No masses, hepatosplenomegaly or hernias noted. Normal Bowel sounds Rectal: Musculoskeletal: Symmetrical with no gross deformities  Skin: No lesions on visible extremities Pulses:  Normal pulses noted Extremities: No clubbing, cyanosis, edema or deformities noted Neurological: Alert oriented x 4, grossly nonfocal Cervical Nodes:  No significant cervical adenopathy Inguinal Nodes: No significant inguinal adenopathy Psychological:  Alert and cooperative. Normal mood and affect  Assessment and Recommendations:  1. New onset  constipation, one episode of hematochezia.  R/O colorectal neoplasm, hemorrhoids, etc. Laxative of choice for bowel movements daily to every other day.  Schedule colonoscopy. The risks (including bleeding, perforation, infection, missed lesions, medication reactions and possible hospitalization or surgery if complications occur), benefits, and alternatives to colonoscopy with possible biopsy and possible polypectomy were discussed with the patient and they consent to proceed.   2.  Personal history of colon polyps, type unknown.  Patient relates prior recommendation for colonoscopies every 5 years and her last colonoscopy was about 10 years ago.  Colonoscopy as above.  3.  GERD with progressively worsening solid food dysphagia.  Rule out esophageal stricture, esophagitis.  Follow standard antireflux measures.  Begin omeprazole 40 mg po daily.  Avoid foods that cause dysphagia.  Schedule EGD with possible dilation. The risks (including bleeding, perforation, infection, missed lesions, medication reactions and possible hospitalization or surgery if complications occur), benefits, and alternatives to endoscopy with possible biopsy and possible dilation were discussed with the patient and they consent to proceed.    cc: Binnie Rail, MD [address] [place], [place] [address]

## 2017-06-24 ENCOUNTER — Other Ambulatory Visit: Payer: Self-pay | Admitting: Internal Medicine

## 2017-06-25 ENCOUNTER — Ambulatory Visit: Payer: Medicare Other | Admitting: Internal Medicine

## 2017-06-25 ENCOUNTER — Ambulatory Visit: Payer: Medicare Other

## 2017-07-05 ENCOUNTER — Telehealth: Payer: Self-pay | Admitting: Gastroenterology

## 2017-07-05 NOTE — Telephone Encounter (Signed)
Informed patient we do have a free sample of Suprep if she can come by and pick it up from our office. Patient states she will come by next week to pick it up.

## 2017-07-18 ENCOUNTER — Encounter: Payer: Self-pay | Admitting: Gastroenterology

## 2017-07-18 ENCOUNTER — Ambulatory Visit (AMBULATORY_SURGERY_CENTER): Payer: Medicare Other | Admitting: Gastroenterology

## 2017-07-18 ENCOUNTER — Other Ambulatory Visit: Payer: Self-pay

## 2017-07-18 VITALS — BP 115/70 | HR 72 | Temp 98.9°F | Resp 20 | Ht <= 58 in | Wt 197.0 lb

## 2017-07-18 DIAGNOSIS — R1310 Dysphagia, unspecified: Secondary | ICD-10-CM | POA: Diagnosis not present

## 2017-07-18 DIAGNOSIS — K921 Melena: Secondary | ICD-10-CM

## 2017-07-18 DIAGNOSIS — Z86010 Personal history of colonic polyps: Secondary | ICD-10-CM | POA: Diagnosis not present

## 2017-07-18 DIAGNOSIS — K625 Hemorrhage of anus and rectum: Secondary | ICD-10-CM | POA: Diagnosis not present

## 2017-07-18 DIAGNOSIS — D120 Benign neoplasm of cecum: Secondary | ICD-10-CM | POA: Diagnosis not present

## 2017-07-18 DIAGNOSIS — D122 Benign neoplasm of ascending colon: Secondary | ICD-10-CM | POA: Diagnosis not present

## 2017-07-18 DIAGNOSIS — D125 Benign neoplasm of sigmoid colon: Secondary | ICD-10-CM

## 2017-07-18 DIAGNOSIS — D128 Benign neoplasm of rectum: Secondary | ICD-10-CM | POA: Diagnosis not present

## 2017-07-18 DIAGNOSIS — D123 Benign neoplasm of transverse colon: Secondary | ICD-10-CM

## 2017-07-18 DIAGNOSIS — K219 Gastro-esophageal reflux disease without esophagitis: Secondary | ICD-10-CM

## 2017-07-18 DIAGNOSIS — K319 Disease of stomach and duodenum, unspecified: Secondary | ICD-10-CM

## 2017-07-18 DIAGNOSIS — K2951 Unspecified chronic gastritis with bleeding: Secondary | ICD-10-CM | POA: Diagnosis not present

## 2017-07-18 DIAGNOSIS — K2970 Gastritis, unspecified, without bleeding: Secondary | ICD-10-CM

## 2017-07-18 DIAGNOSIS — D124 Benign neoplasm of descending colon: Secondary | ICD-10-CM | POA: Diagnosis not present

## 2017-07-18 DIAGNOSIS — K222 Esophageal obstruction: Secondary | ICD-10-CM

## 2017-07-18 MED ORDER — SODIUM CHLORIDE 0.9 % IV SOLN: 500.0000 mL | Freq: Once | INTRAVENOUS | Status: DC

## 2017-07-18 NOTE — Progress Notes (Signed)
To PACU, VSS. Report to [email or web address] 

## 2017-07-18 NOTE — Op Note (Signed)
[facility] Patient Name: Shelley Hayes Procedure Date: 07/18/2017 3:25 PM MRN: [id number] Endoscopist: Ladene Artist , MD Age: 72 Referring MD:  Date of Birth: 1945-09-09 Gender: Female Account #: [id number] Procedure:                Colonoscopy Indications:              Surveillance: Personal history of colonic polyps                            (unknown histology) on last colonoscopy more than 5                            years ago, Surveillance: Personal history of                            colonic polyps with unknown histology (unable to                            locate pathology results from last colonoscopy less                            than 3 years ago) Medicines:                Monitored Anesthesia Care Procedure:                Pre-Anesthesia Assessment:                           - Prior to the procedure, a History and Physical                            was performed, and patient medications and                            allergies were reviewed. The patient's tolerance of                            previous anesthesia was also reviewed. The risks                            and benefits of the procedure and the sedation                            options and risks were discussed with the patient.                            All questions were answered, and informed consent                            was obtained. Prior Anticoagulants: The patient has                            taken no previous anticoagulant or antiplatelet  agents. ASA Grade Assessment: II - A patient with                            mild systemic disease. After reviewing the risks                            and benefits, the patient was deemed in                            satisfactory condition to undergo the procedure.                           After obtaining informed consent, the colonoscope                            was passed under direct vision. Throughout the                            procedure, the patient's blood pressure, pulse, and                            oxygen saturations were monitored continuously. The                            Model PCF-[id number]) scope was introduced                            through the anus and advanced to the the cecum,                            identified by appendiceal orifice and ileocecal                            valve. The ileocecal valve, appendiceal orifice,                            and rectum were photographed. The quality of the                            bowel preparation was good. The colonoscopy was                            performed without difficulty. The patient tolerated                            the procedure well. Scope In: 3:39:54 PM Scope Out: 4:24:22 PM Scope Withdrawal Time: 0 hours 41 minutes 49 seconds  Total Procedure Duration: 0 hours 44 minutes 28 seconds  Findings:                 The perianal and digital rectal examinations were                            normal.  34 sessile polyps were found in the rectum (2),                            sigmoid colon (18), descending colon(4), transverse                            colon (5), ascending colon(2) and cecum (1). The                            polyps were 5 to 8 mm in size. These polyps were                            removed with a cold snare. Resection and retrieval                            were complete. Many of the polyp site had oozing                            that stopped spontaneously. Multiple polyps in 3 -                            5 mm range in the sigmoid, descending and                            transverse were not removed - risk of prolonged                            oozing or bleeding was considered.                           Two sessile polyps were found in the transverse                            colon and hepatic flexure. The polyps were 4 mm in                             size. These polyps were removed with a cold biopsy                            forceps. Resection and retrieval were complete.                           Two pedunculated polyps were found in the distal                            sigmoid colon. The polyps were 11 to 14 mm in size.                            These polyps were removed with a hot snare.                            Resection and  retrieval were complete.                           Internal hemorrhoids were found during                            retroflexion. The hemorrhoids were medium-sized and                            Grade I (internal hemorrhoids that do not prolapse).                           The exam was otherwise without abnormality on                            direct and retroflexion views. Complications:            No immediate complications. Estimated blood loss:                            None. Estimated Blood Loss:     Estimated blood loss: none. Impression:               - 34 5 to 8 mm polyps in the rectum, in the sigmoid                            colon, in the descending colon, in the transverse                            colon, in the ascending colon and in the cecum,                            removed with a cold snare. Resected and retrieved.                           - Two 4 mm polyps in the transverse colon and at                            the hepatic flexure, removed with a cold biopsy                            forceps. Resected and retrieved.                           - Two 11 to 14 mm polyps in the distal sigmoid                            colon, removed with a hot snare. Resected and                            retrieved.                           - Internal hemorrhoids.                           -  The examination was otherwise normal on direct                            and retroflexion views. Recommendation:           - Repeat colonoscopy in 6 months for surveillance                             based on pathology results.                           - Patient has a contact number available for                            emergencies. The signs and symptoms of potential                            delayed complications were discussed with the                            patient. Return to normal activities tomorrow.                            Written discharge instructions were provided to the                            patient.                           - Resume previous diet.                           - Continue present medications.                           - Await pathology results.                           - No aspirin, ibuprofen, naproxen, or other                            non-steroidal anti-inflammatory drugs for 2 weeks                            after polyp removal. Ladene Artist, MD 07/18/2017 4:43:26 PM This report has been signed electronically.

## 2017-07-18 NOTE — Progress Notes (Signed)
Called to room to assist during endoscopic procedure.  Patient ID and intended procedure confirmed with present staff. Received instructions for my participation in the procedure from the performing physician.  

## 2017-07-18 NOTE — Op Note (Signed)
[facility] Patient Name: Shelley Hayes Procedure Date: 07/18/2017 3:25 PM MRN: [id number] Endoscopist: Ladene Artist , MD Age: 72 Referring MD:  Date of Birth: 16-May-1945 Gender: Female Account #: [id number] Procedure:                Upper GI endoscopy Indications:              Dysphagia, Gastroesophageal reflux disease Medicines:                Monitored Anesthesia Care Procedure:                Pre-Anesthesia Assessment:                           - Prior to the procedure, a History and Physical                            was performed, and patient medications and                            allergies were reviewed. The patient's tolerance of                            previous anesthesia was also reviewed. The risks                            and benefits of the procedure and the sedation                            options and risks were discussed with the patient.                            All questions were answered, and informed consent                            was obtained. Prior Anticoagulants: The patient has                            taken no previous anticoagulant or antiplatelet                            agents. ASA Grade Assessment: II - A patient with                            mild systemic disease. After reviewing the risks                            and benefits, the patient was deemed in                            satisfactory condition to undergo the procedure.                           After obtaining informed consent, the endoscope was  passed under direct vision. Throughout the                            procedure, the patient's blood pressure, pulse, and                            oxygen saturations were monitored continuously. The                            Model GIF-[id number] scope was introduced                            through the mouth, and advanced to the second part                            of duodenum.  The upper GI endoscopy was                            accomplished without difficulty. The patient                            tolerated the procedure well. Scope In: Scope Out: Findings:                 No endoscopic abnormality was evident in the                            esophagus to explain the patient's complaint of                            dysphagia. It was decided, however, to proceed with                            dilation of the entire esophagus. A guidewire was                            placed and the scope was withdrawn. Dilation was                            performed with a Savary dilator with no resistance                            at 16 mm.                           Diffuse atrophic mucosa and mild erythema was found                            in the gastric fundus and in the gastric body.                            Biopsies were taken with a cold forceps for  histology.                           The exam of the stomach was otherwise normal.                           The duodenal bulb and second portion of the                            duodenum were normal. Complications:            No immediate complications. Estimated Blood Loss:     Estimated blood loss: none. Estimated blood loss                            was minimal. Impression:               - No endoscopic esophageal abnormality to explain                            patient's dysphagia. Esophagus dilated. Dilated.                           - Gastric mucosal atrophy. Biopsied.                           - Normal duodenal bulb and second portion of the                            duodenum. Recommendation:           - Patient has a contact number available for                            emergencies. The signs and symptoms of potential                            delayed complications were discussed with the                            patient. Return to normal activities tomorrow.                             Written discharge instructions were provided to the                            patient.                           - Clear liquid diet for 2 hours, then advance as                            tolerated to soft diet today.                           - Resume prior diet tomorrow.                           -  Continue present medications.                           - Await pathology results.                           - Return to GI office in 1 month to review                            pathology from EGD and colonoscopy. Ladene Artist, MD 07/18/2017 4:48:16 PM This report has been signed electronically.

## 2017-07-18 NOTE — Patient Instructions (Addendum)
Thank you for allowing [place] to care for you today.  Await pathology results,  Based on results may need repeat colonoscopy in 6 months.  Handouts for POLYPS, Diverticulosis, Stricture, Dilation Diet.  Make follow-up appointment to be seen in GI office in one month (June 2019).    NO ASPIRIN OR NSAIDS (IBUPROFEN, NAPROXEN, ADVIL, MOTRIN) FOR TWO WEEKS.   YOU HAD AN ENDOSCOPIC PROCEDURE TODAY AT [facility]:   Refer to the procedure report that was given to you for any specific questions about what was found during the examination.  If the procedure report does not answer your questions, please call your gastroenterologist to clarify.  If you requested that your care partner not be given the details of your procedure findings, then the procedure report has been included in a sealed envelope for you to review at your convenience later.  YOU SHOULD EXPECT: Some feelings of bloating in the abdomen. Passage of more gas than usual.  Walking can help get rid of the air that was put into your GI tract during the procedure and reduce the bloating. If you had a lower endoscopy (such as a colonoscopy or flexible sigmoidoscopy) you may notice spotting of blood in your stool or on the toilet paper. If you underwent a bowel prep for your procedure, you may not have a normal bowel movement for a few days.  Please Note:  You might notice some irritation and congestion in your nose or some drainage.  This is from the oxygen used during your procedure.  There is no need for concern and it should clear up in a day or so.  SYMPTOMS TO REPORT IMMEDIATELY:   Following lower endoscopy (colonoscopy or flexible sigmoidoscopy):  Excessive amounts of blood in the stool  Significant tenderness or worsening of abdominal pains  Swelling of the abdomen that is new, acute  Fever of 100F or higher   Following upper endoscopy (EGD)  Vomiting of blood or coffee ground material  New chest pain or pain  under the shoulder blades  Painful or persistently difficult swallowing  New shortness of breath  Fever of 100F or higher  Black, tarry-looking stools  For urgent or emergent issues, a gastroenterologist can be reached at any hour by calling [phone].   DIET:  See Post Dilation Diet sheet for instructions for today.   You may proceed to your regular diet in the morning.    Drink plenty of fluids   ACTIVITY:  You should plan to take it easy for the rest of today and you should NOT DRIVE or use heavy machinery until tomorrow (because of the sedation medicines used during the test).    FOLLOW UP: Our staff will call the number listed on your records the next business day following your procedure to check on you and address any questions or concerns that you may have regarding the information given to you following your procedure. If we do not reach you, we will leave a message.  However, if you are feeling well and you are not experiencing any problems, there is no need to return our call.  We will assume that you have returned to your regular daily activities without incident.  If any biopsies were taken you will be contacted by phone or by letter within the next 1-3 weeks.  Please call us at [phone] if you have not heard about the biopsies in 3 weeks.    SIGNATURES/CONFIDENTIALITY: You and/or your care partner have  signed paperwork which will be entered into your electronic medical record.  These signatures attest to the fact that that the information above on your After Visit Summary has been reviewed and is understood.  Full responsibility of the confidentiality of this discharge information lies with you and/or your care-partner.

## 2017-07-19 ENCOUNTER — Telehealth: Payer: Self-pay | Admitting: *Deleted

## 2017-07-19 NOTE — Telephone Encounter (Signed)
  Follow up Call-  Call back number 07/18/2017  Post procedure Call Back phone  # #[phone] cell  Permission to leave phone message Yes  Some recent data might be hidden     Patient questions:  Do you have a fever, pain , or abdominal swelling? No. Pain Score  0 *  Have you tolerated food without any problems? Yes.    Have you been able to return to your normal activities? Yes.    Do you have any questions about your discharge instructions: Diet   No. Medications  No. Follow up visit  No.  Do you have questions or concerns about your Care? No.  Actions: * If pain score is 4 or above: No action needed, pain <4.

## 2017-07-24 NOTE — Progress Notes (Signed)
Subjective:    Patient ID: Shelley Hayes, female    DOB: [birth year], 72 y.o.   MRN: [id number]  HPI The patient is here for follow up.  Hypertension: She is taking her medication daily. She is compliant with a low sodium diet.  She denies chest pain, edema, shortness of breath and regular headaches. She is not exercising regularly since her colonoscopy.  She does not monitor her blood pressure at home.    Hyperlipidemia: She is taking her medication daily. She is compliant with a low fat/cholesterol diet. She is not exercising regularly. She denies myalgias.   Prediabetes:  She is compliant with a low sugar/carbohydrate diet.  She is not currently exercising regularly.  Anxiety: She is taking her xanax as needed only.  She denies any side effects from the medication. She has only taken the medication once in the past three months.    For the past couple of months she has felt generalized weak and after her colonoscopy, 07/18/17, she has felt the weakness has gotten worse.  It just started to get better, but she is not back to her normal.      She has difficulty swallowing.  She had an EGD and it was normal.  She takes the omeprazole.  She has decreased appetite.  Yesterday she started to feel it was coming back.    pressure to urinate and goes but only goes a little.  She does feel like she is completedly emptying her bladder.   This started a while ago.  She did mention this to her gyn and they discussed seeing a urologist, but no abnormalities on exam.    Medications and allergies reviewed with patient and updated if appropriate.  Patient Active Problem List   Diagnosis Date Noted  . Incomplete emptying of bladder 07/25/2017  . Chest pain 04/24/2017  . Prediabetes 04/24/2017  . Herpes zoster without complication 35/32/9924  . Blood in stool 04/24/2017  . Anxiety 04/24/2017  . Muscle cramps 06/19/2016  . Cough 06/19/2016  . Osteopenia 04/26/2016  . Fluttering heart 12/23/2015    . Cervical paraspinal muscle spasm 12/23/2015  . SCIATICA, LEFT 01/20/2010  . Hyperlipidemia 08/23/2009  . GLUCOMA 08/23/2009  . Essential hypertension 08/23/2009  . Allergic rhinitis 08/23/2009  . ARTHRITIS 08/23/2009  . KNEE PAIN 08/23/2009    Current Outpatient Medications on File Prior to Visit  Medication Sig Dispense Refill  . albuterol (PROVENTIL HFA;VENTOLIN HFA) 108 (90 Base) MCG/ACT inhaler Inhale 2 puffs into the lungs every 6 (six) hours as needed for wheezing or shortness of breath. 1 Inhaler 5  . ALPRAZolam (XANAX) 0.5 MG tablet Take 1 tablet (0.5 mg total) by mouth 2 (two) times daily as needed for anxiety or sleep. 30 tablet 0  . amLODipine (NORVASC) 10 MG tablet Take 10 mg by mouth daily.  3  . aspirin 81 MG tablet Take 81 mg by mouth daily.      Marland Kitchen atorvastatin (LIPITOR) 40 MG tablet TAKE 1 TABLET BY MOUTH EVERY DAY 90 tablet 3  . Cholecalciferol (VITAMIN D3) 1000 UNITS CAPS Take 1 capsule (1,000 Units total) by mouth daily. 30 capsule   . fluticasone (FLONASE) 50 MCG/ACT nasal spray PLACE 2 SPRAYS INTO BOTH NOSTRILS DAILY. 16 g 2  . hydrochlorothiazide (MICROZIDE) 12.5 MG capsule Take 1 capsule (12.5 mg total) by mouth daily. 90 capsule 0  . latanoprost (XALATAN) 0.005 % ophthalmic solution Place 1 drop into both eyes at bedtime.  6  .  Multiple Vitamin (MULTIVITAMIN) tablet Take 1 tablet by mouth daily.      Marland Kitchen omeprazole (PRILOSEC) 40 MG capsule Take 1 capsule (40 mg total) by mouth daily. 30 capsule 11  . vitamin B-12 (CYANOCOBALAMIN) 1000 MCG tablet Take 1 tablet (1,000 mcg total) by mouth daily.     No current facility-administered medications on file prior to visit.     Past Medical History:  Diagnosis Date  . ALLERGIC RHINITIS   . Allergic rhinitis 08/23/2009  . Allergy   . Anxiety   . ARTHRITIS   . Arthritis   . Blood in stool 04/24/2017  . Cataract    bil cataracts removes  . Cervical paraspinal muscle spasm 12/23/2015  . Chest pain 04/24/2017  . CHEST  PAIN 12/19/2009   Qualifier: Diagnosis of  By: Marca Ancona RMA, Lucy    . COLONIC POLYPS, HX OF 08/23/2009   Qualifier: Diagnosis of  By: Marca Ancona RMA, Lucy    . Cough 06/19/2016  . Fluttering heart 12/23/2015  . GERD (gastroesophageal reflux disease)   . Glaucoma   . GLUCOMA   . Heart murmur   . Herpes zoster without complication 12/04/2681  . Hyperglycemia 06/19/2016  . HYPERLIPIDEMIA   . HYPERTENSION   . KNEE PAIN 08/23/2009   Qualifier: Diagnosis of  By: Asa Lente MD, Jannifer Rodney Muscle cramps 06/19/2016  . Osteopenia 04/26/2016   2/18 - dexa - RFN and LFN  -1.2  . OTITIS EXTERNA, RIGHT 01/20/2010   Qualifier: Diagnosis of  By: Asa Lente MD, Jannifer Rodney Prediabetes 04/24/2017  . SCIATICA, LEFT 11/2010    Past Surgical History:  Procedure Laterality Date  . BREAST BIOPSY    . COLONOSCOPY    . TUBAL LIGATION      Social History   Socioeconomic History  . Marital status: Divorced    Spouse name: Not on file  . Number of children: 2  . Years of education: Not on file  . Highest education level: Not on file  Occupational History  . Occupation: Retired  Scientific laboratory technician  . Financial resource strain: Not on file  . Food insecurity:    Worry: Never true    Inability: Never true  . Transportation needs:    Medical: Yes    Non-medical: Yes  Tobacco Use  . Smoking status: Never Smoker  . Smokeless tobacco: Never Used  Substance and Sexual Activity  . Alcohol use: No    Alcohol/week: 0.0 oz  . Drug use: No  . Sexual activity: Not Currently  Lifestyle  . Physical activity:    Days per week: 3 days    Minutes per session: 30 min  . Stress: Only a little  Relationships  . Social connections:    Talks on phone: More than three times a week    Gets together: More than three times a week    Attends religious service: More than 4 times per year    Active member of club or organization: Yes    Attends meetings of clubs or organizations: More than 4 times per year    Relationship status:  Not on file  Other Topics Concern  . Not on file  Social History Narrative   Divorced, lives alone- in [place] for 9yrs but moved "home" to [place] in 2008      Walking regularly, every morning    Family History  Problem Relation Age of Onset  . Arthritis Mother   . Diabetes Mother   .  Colon cancer Mother 20  . Arthritis Father   . Alcohol abuse Brother   . Lung cancer Brother   . Prostate cancer Brother   . Diabetes Brother   . Hypertension Brother   . Lung cancer Other        Neice  . Esophageal cancer Neg Hx   . Liver cancer Neg Hx   . Pancreatic cancer Neg Hx   . Rectal cancer Neg Hx   . Stomach cancer Neg Hx     Review of Systems  Constitutional: Positive for appetite change (improving) and fatigue (improving). Negative for chills and fever.  Respiratory: Negative for cough, shortness of breath and wheezing.   Cardiovascular: Positive for palpitations (occ, improved). Negative for chest pain and leg swelling.  Gastrointestinal: Positive for constipation. Negative for abdominal pain and nausea.  Genitourinary: Positive for difficulty urinating and urgency (only a small amount). Negative for dysuria and hematuria.  Neurological: Negative for light-headedness and headaches.       Objective:   Vitals:   07/25/17 0918  BP: 126/78  Pulse: 100  Resp: 16  SpO2: 99%   BP Readings from Last 3 Encounters:  07/25/17 126/78  07/25/17 126/78  07/18/17 115/70   Wt Readings from Last 3 Encounters:  07/25/17 197 lb (89.4 kg)  07/25/17 197 lb (89.4 kg)  07/18/17 197 lb (89.4 kg)   Body mass index is 42.63 kg/m.   Physical Exam    Constitutional: Appears well-developed and well-nourished. No distress.  HENT:  Head: Normocephalic and atraumatic.  Neck: Neck supple. No tracheal deviation present. No thyromegaly present.  No cervical lymphadenopathy Cardiovascular: Normal rate, regular rhythm and normal heart sounds.   No murmur heard. No carotid bruit .  No  edema Pulmonary/Chest: Effort normal and breath sounds normal. No respiratory distress. No has no wheezes. No rales.  Skin: Skin is warm and dry. Not diaphoretic.  Psychiatric: Normal mood and affect. Behavior is normal.      Assessment & Plan:    See Problem List for Assessment and Plan of chronic medical problems.

## 2017-07-24 NOTE — Patient Instructions (Addendum)
For the constipation you can try adding fiber like metamucil, starting a stool softener or taking miralax.   Test(s) ordered today. Your results will be released to [place] (or called to you) after review, usually within 72hours after test completion. If any changes need to be made, you will be notified at that same time.   Medications reviewed and updated.  No changes recommended at this time.    Please followup in 6 months

## 2017-07-24 NOTE — Progress Notes (Addendum)
Subjective:   Shelley Hayes is a 72 y.o. female who presents for Medicare Annual (Subsequent) preventive examination.  Review of Systems:  No ROS.  Medicare Wellness Visit. Additional risk factors are reflected in the social history.  Cardiac Risk Factors include: advanced age (>65men, >61 women);dyslipidemia;hypertension;obesity (BMI >30kg/m2) Sleep patterns: feels rested on waking, gets up 1 times nightly to void and sleeps 7-8 hours nightly.    Home Safety/Smoke Alarms: Feels safe in home. Smoke alarms in place.  Living environment; residence and Firearm Safety: 1-story house/ trailer, no firearms. Lives with daughter, no needs for DME, good support system,  Seat Belt Safety/Bike Helmet: Wears seat belt.     Objective:     Vitals: BP 126/78   Pulse 70   Temp 98.6 F (37 C)   Resp 17   Ht 4\' 9"  (1.448 m)   Wt 197 lb (89.4 kg)   SpO2 100%   BMI 42.63 kg/m   Body mass index is 42.63 kg/m.  Advanced Directives 07/25/2017 02/29/2016  Does Patient Have a Medical Advance Directive? Yes Yes  Type of Paramedic of [place];Living will Living will;Healthcare Power of Attorney  Does patient want to make changes to medical advance directive? - No - Patient declined  Copy of [place] in Chart? No - copy requested No - copy requested    Tobacco Social History   Tobacco Use  Smoking Status Never Smoker  Smokeless Tobacco Never Used     Counseling given: Not Answered   Past Medical History:  Diagnosis Date  . ALLERGIC RHINITIS   . Allergic rhinitis 08/23/2009  . Allergy   . Anxiety   . ARTHRITIS   . Arthritis   . Blood in stool 04/24/2017  . Cataract    bil cataracts removes  . Cervical paraspinal muscle spasm 12/23/2015  . Chest pain 04/24/2017  . CHEST PAIN 12/19/2009   Qualifier: Diagnosis of  By: Marca Ancona RMA, Lucy    . COLONIC POLYPS, HX OF 08/23/2009   Qualifier: Diagnosis of  By: Marca Ancona RMA, Lucy    . Cough 06/19/2016  .  Fluttering heart 12/23/2015  . GERD (gastroesophageal reflux disease)   . Glaucoma   . GLUCOMA   . Heart murmur   . Herpes zoster without complication 7/84/6962  . Hyperglycemia 06/19/2016  . HYPERLIPIDEMIA   . HYPERTENSION   . KNEE PAIN 08/23/2009   Qualifier: Diagnosis of  By: Asa Lente MD, Jannifer Rodney Muscle cramps 06/19/2016  . Osteopenia 04/26/2016   2/18 - dexa - RFN and LFN  -1.2  . OTITIS EXTERNA, RIGHT 01/20/2010   Qualifier: Diagnosis of  By: Asa Lente MD, Jannifer Rodney Prediabetes 04/24/2017  . SCIATICA, LEFT 11/2010   Past Surgical History:  Procedure Laterality Date  . BREAST BIOPSY    . COLONOSCOPY    . TUBAL LIGATION     Family History  Problem Relation Age of Onset  . Arthritis Mother   . Diabetes Mother   . Colon cancer Mother 32  . Arthritis Father   . Alcohol abuse Brother   . Lung cancer Brother   . Prostate cancer Brother   . Diabetes Brother   . Hypertension Brother   . Lung cancer Other        Neice  . Esophageal cancer Neg Hx   . Liver cancer Neg Hx   . Pancreatic cancer Neg Hx   . Rectal cancer Neg Hx   .  Stomach cancer Neg Hx    Social History   Socioeconomic History  . Marital status: Divorced    Spouse name: Not on file  . Number of children: 2  . Years of education: Not on file  . Highest education level: Not on file  Occupational History  . Occupation: Retired  Scientific laboratory technician  . Financial resource strain: Not on file  . Food insecurity:    Worry: Never true    Inability: Never true  . Transportation needs:    Medical: Yes    Non-medical: Yes  Tobacco Use  . Smoking status: Never Smoker  . Smokeless tobacco: Never Used  Substance and Sexual Activity  . Alcohol use: No    Alcohol/week: 0.0 oz  . Drug use: No  . Sexual activity: Not Currently  Lifestyle  . Physical activity:    Days per week: 3 days    Minutes per session: 30 min  . Stress: Only a little  Relationships  . Social connections:    Talks on phone: More than  three times a week    Gets together: More than three times a week    Attends religious service: More than 4 times per year    Active member of club or organization: Yes    Attends meetings of clubs or organizations: More than 4 times per year    Relationship status: Not on file  Other Topics Concern  . Not on file  Social History Narrative   Divorced, lives alone- in [place] for 78yrs but moved "home" to [place] in 2008      Walking regularly, every morning    Outpatient Encounter Medications as of 07/25/2017  Medication Sig  . albuterol (PROVENTIL HFA;VENTOLIN HFA) 108 (90 Base) MCG/ACT inhaler Inhale 2 puffs into the lungs every 6 (six) hours as needed for wheezing or shortness of breath.  . ALPRAZolam (XANAX) 0.5 MG tablet Take 1 tablet (0.5 mg total) by mouth 2 (two) times daily as needed for anxiety or sleep.  Marland Kitchen amLODipine (NORVASC) 10 MG tablet Take 10 mg by mouth daily.  Marland Kitchen aspirin 81 MG tablet Take 81 mg by mouth daily.    Marland Kitchen atorvastatin (LIPITOR) 40 MG tablet TAKE 1 TABLET BY MOUTH EVERY DAY  . Cholecalciferol (VITAMIN D3) 1000 UNITS CAPS Take 1 capsule (1,000 Units total) by mouth daily.  . fluticasone (FLONASE) 50 MCG/ACT nasal spray PLACE 2 SPRAYS INTO BOTH NOSTRILS DAILY.  . hydrochlorothiazide (MICROZIDE) 12.5 MG capsule Take 1 capsule (12.5 mg total) by mouth daily.  Marland Kitchen latanoprost (XALATAN) 0.005 % ophthalmic solution Place 1 drop into both eyes at bedtime.  . Multiple Vitamin (MULTIVITAMIN) tablet Take 1 tablet by mouth daily.    Marland Kitchen omeprazole (PRILOSEC) 40 MG capsule Take 1 capsule (40 mg total) by mouth daily.  . vitamin B-12 (CYANOCOBALAMIN) 1000 MCG tablet Take 1 tablet (1,000 mcg total) by mouth daily.  . [DISCONTINUED] 0.9 %  sodium chloride infusion    No facility-administered encounter medications on file as of 07/25/2017.     Activities of Daily Living In your present state of health, do you have any difficulty performing the following activities: 07/25/2017    Hearing? N  Vision? N  Difficulty concentrating or making decisions? N  Walking or climbing stairs? N  Dressing or bathing? N  Doing errands, shopping? N  Preparing Food and eating ? N  Using the Toilet? N  In the past six months, have you accidently leaked urine? N  Do you  have problems with loss of bowel control? N  Managing your Medications? N  Managing your Finances? N  Housekeeping or managing your Housekeeping? N  Some recent data might be hidden    Patient Care Team: Binnie Rail, MD as PCP - General (Internal Medicine) Everlene Farrier, MD (Obstetrics and Gynecology) Victory Dakin, MD as Referring Physician (Optometry)    Assessment:   This is a routine wellness examination for [place]. Physical assessment deferred to PCP.   Exercise Activities and Dietary recommendations Current Exercise Habits: Home exercise routine, Type of exercise: walking, Time (Minutes): 40, Frequency (Times/Week): 5, Weekly Exercise (Minutes/Week): 200, Intensity: Mild, Exercise limited by: orthopedic condition(s)  Diet (meal preparation, eat out, water intake, caffeinated beverages, dairy products, fruits and vegetables): in general, an "unhealthy" diet, high fat/ cholesterol   Reviewed heart healthy diet, discussed weight loss strategies, encouraged patient to increase daily water intake. Diet education was attached to patient's AVS. Relevant patient education assigned to patient using Emmi.  Goals    . Patient Stated     I want to make life changes to be healthier. Monitor what I eat for decreasing sugar, carbohydrates and fat. Continue to drink a lot water and increase physical activity by starting to walk again and do chair exercises.     . Weight (lb) < 175 lb (79.4 kg)     Would like to lose 20 lbs by increasing activity (joining [employer]) and continuing health food choices.        Fall Risk Fall Risk  07/25/2017 04/24/2017 02/29/2016 02/23/2014  Falls in the past year? No No No  No    Depression Screen PHQ 2/9 Scores 07/25/2017 04/24/2017 02/29/2016 02/23/2014  PHQ - 2 Score 1 0 0 2  PHQ- 9 Score 3 - - 4     Cognitive Function       Ad8 score reviewed for issues:  Issues making decisions: no  Less interest in hobbies / activities: no  Repeats questions, stories (family complaining): no  Trouble using ordinary gadgets (microwave, computer, phone):no  Forgets the month or year: no  Mismanaging finances: no  Remembering appts: no  Daily problems with thinking and/or memory: no Ad8 score is= 0    Immunization History  Administered Date(s) Administered  . Influenza Whole 01/20/2010  . Influenza, High Dose Seasonal PF 12/30/2012, 02/26/2015, 02/29/2016, 02/06/2017  . Influenza,inj,Quad PF,6+ Mos 02/23/2014  . Pneumococcal Conjugate-13 02/29/2016  . Pneumococcal Polysaccharide-23 02/23/2014  . Tetanus 10/25/2011   Screening Tests Health Maintenance  Topic Date Due  . INFLUENZA VACCINE  10/11/2017  . MAMMOGRAM  02/10/2018  . DEXA SCAN  04/26/2018  . TETANUS/TDAP  10/24/2021  . COLONOSCOPY  07/19/2027  . Hepatitis C Screening  Completed  . PNA vac Low Risk Adult  Completed      Plan:    Continue doing brain stimulating activities (puzzles, reading, adult coloring books, staying active) to keep memory sharp.   Continue to eat heart healthy diet (full of fruits, vegetables, whole grains, lean protein, water--limit salt, fat, and sugar intake) and increase physical activity as tolerated.  I have personally reviewed and noted the following in the patient's chart:   . Medical and social history . Use of alcohol, tobacco or illicit drugs  . Current medications and supplements . Functional ability and status . Nutritional status . Physical activity . Advanced directives . List of other physicians . Vitals . Screenings to include cognitive, depression, and falls . Referrals and appointments  In addition, I have reviewed and discussed  with patient certain preventive protocols, quality metrics, and best practice recommendations. A written personalized care plan for preventive services as well as general preventive health recommendations were provided to patient.     Michiel Cowboy, RN  07/25/2017    Medical screening examination/treatment/procedure(s) were performed by non-physician practitioner and as supervising physician I was immediately available for consultation/collaboration. I agree with above. Binnie Rail, MD

## 2017-07-25 ENCOUNTER — Ambulatory Visit (INDEPENDENT_AMBULATORY_CARE_PROVIDER_SITE_OTHER): Payer: Medicare Other | Admitting: *Deleted

## 2017-07-25 ENCOUNTER — Encounter: Payer: Self-pay | Admitting: Internal Medicine

## 2017-07-25 ENCOUNTER — Other Ambulatory Visit (INDEPENDENT_AMBULATORY_CARE_PROVIDER_SITE_OTHER): Payer: Medicare Other

## 2017-07-25 ENCOUNTER — Ambulatory Visit (INDEPENDENT_AMBULATORY_CARE_PROVIDER_SITE_OTHER): Payer: Medicare Other | Admitting: Internal Medicine

## 2017-07-25 VITALS — BP 126/78 | HR 70 | Temp 98.6°F | Resp 17 | Ht <= 58 in | Wt 197.0 lb

## 2017-07-25 VITALS — BP 126/78 | HR 100 | Resp 16 | Ht <= 58 in | Wt 197.0 lb

## 2017-07-25 DIAGNOSIS — R7303 Prediabetes: Secondary | ICD-10-CM

## 2017-07-25 DIAGNOSIS — E782 Mixed hyperlipidemia: Secondary | ICD-10-CM | POA: Diagnosis not present

## 2017-07-25 DIAGNOSIS — R339 Retention of urine, unspecified: Secondary | ICD-10-CM

## 2017-07-25 DIAGNOSIS — R5383 Other fatigue: Secondary | ICD-10-CM | POA: Diagnosis not present

## 2017-07-25 DIAGNOSIS — F419 Anxiety disorder, unspecified: Secondary | ICD-10-CM

## 2017-07-25 DIAGNOSIS — I10 Essential (primary) hypertension: Secondary | ICD-10-CM

## 2017-07-25 DIAGNOSIS — R1310 Dysphagia, unspecified: Secondary | ICD-10-CM | POA: Diagnosis not present

## 2017-07-25 DIAGNOSIS — Z0000 Encounter for general adult medical examination without abnormal findings: Secondary | ICD-10-CM | POA: Diagnosis not present

## 2017-07-25 LAB — CBC WITH DIFFERENTIAL/PLATELET
BASOS PCT: 1.1 % (ref 0.0–3.0)
Basophils Absolute: 0.1 10*3/uL (ref 0.0–0.1)
EOS ABS: 0.1 10*3/uL (ref 0.0–0.7)
Eosinophils Relative: 1.6 % (ref 0.0–5.0)
HEMATOCRIT: 41.9 % (ref 36.0–46.0)
Hemoglobin: 13.7 g/dL (ref 12.0–15.0)
LYMPHS PCT: 24.2 % (ref 12.0–46.0)
Lymphs Abs: 1.9 10*3/uL (ref 0.7–4.0)
MCHC: 32.7 g/dL (ref 30.0–36.0)
MCV: 86.1 fl (ref 78.0–100.0)
Monocytes Absolute: 0.6 10*3/uL (ref 0.1–1.0)
Monocytes Relative: 7.1 % (ref 3.0–12.0)
NEUTROS ABS: 5.3 10*3/uL (ref 1.4–7.7)
Neutrophils Relative %: 66 % (ref 43.0–77.0)
PLATELETS: 342 10*3/uL (ref 150.0–400.0)
RBC: 4.87 Mil/uL (ref 3.87–5.11)
RDW: 14.4 % (ref 11.5–15.5)
WBC: 8 10*3/uL (ref 4.0–10.5)

## 2017-07-25 LAB — HEMOGLOBIN A1C: Hgb A1c MFr Bld: 5.4 % (ref 4.6–6.5)

## 2017-07-25 LAB — COMPREHENSIVE METABOLIC PANEL
ALT: 21 U/L (ref 0–35)
AST: 24 U/L (ref 0–37)
Albumin: 4.2 g/dL (ref 3.5–5.2)
Alkaline Phosphatase: 83 U/L (ref 39–117)
BILIRUBIN TOTAL: 0.6 mg/dL (ref 0.2–1.2)
BUN: 13 mg/dL (ref 6–23)
CO2: 28 meq/L (ref 19–32)
Calcium: 9.7 mg/dL (ref 8.4–10.5)
Chloride: 102 mEq/L (ref 96–112)
Creatinine, Ser: 0.89 mg/dL (ref 0.40–1.20)
GFR: 80.21 mL/min (ref >60.00)
GLUCOSE: 93 mg/dL (ref 70–99)
Potassium: 3.5 mEq/L (ref 3.5–5.1)
SODIUM: 142 meq/L (ref 135–145)
Total Protein: 8.3 g/dL (ref 6.0–8.3)

## 2017-07-25 LAB — LIPID PANEL
CHOL/HDL RATIO: 4
Cholesterol: 190 mg/dL (ref 0–200)
HDL: 45.8 mg/dL (ref >39.00)
LDL Cholesterol: 120 mg/dL — ABNORMAL HIGH (ref 0–99)
NONHDL: 143.87
TRIGLYCERIDES: 119 mg/dL (ref 0.0–149.0)
VLDL: 23.8 mg/dL (ref 0.0–40.0)

## 2017-07-25 LAB — URINALYSIS, ROUTINE W REFLEX MICROSCOPIC
BILIRUBIN URINE: NEGATIVE
Hgb urine dipstick: NEGATIVE
Ketones, ur: NEGATIVE
Nitrite: NEGATIVE
PH: 6.5 (ref 5.0–8.0)
RBC / HPF: NONE SEEN (ref 0–?)
Total Protein, Urine: NEGATIVE
Urine Glucose: NEGATIVE
Urobilinogen, UA: 0.2 (ref 0.0–1.0)

## 2017-07-25 LAB — TSH: TSH: 1 u[IU]/mL (ref 0.35–4.50)

## 2017-07-25 NOTE — Patient Instructions (Addendum)
Continue doing brain stimulating activities (puzzles, reading, adult coloring books, staying active) to keep memory sharp.   Continue to eat heart healthy diet (full of fruits, vegetables, whole grains, lean protein, water--limit salt, fat, and sugar intake) and increase physical activity as tolerated.   Shelley Hayes , Thank you for taking time to come for your Medicare Wellness Visit. I appreciate your ongoing commitment to your health goals. Please review the following plan we discussed and let me know if I can assist you in the future.   These are the goals we discussed: Goals    . Patient Stated     I want to make life changes to be healthier. Monitor what I eat for decreasing sugar, carbohydrates and fat. Continue to drink a lot water and increase physical activity by starting to walk again and do chair exercises.     . Weight (lb) < 175 lb (79.4 kg)     Would like to lose 20 lbs by increasing activity (joining [employer]) and continuing health food choices.        This is a list of the screening recommended for you and due dates:  Health Maintenance  Topic Date Due  . Flu Shot  10/11/2017  . Mammogram  02/10/2018  . DEXA scan (bone density measurement)  04/26/2018  . Tetanus Vaccine  10/24/2021  . Colon Cancer Screening  07/19/2027  .  Hepatitis C: One time screening is recommended by Center for Disease Control  (CDC) for  adults born from [birth year] through 1965.   Completed  . Pneumonia vaccines  Completed     Fat and Cholesterol Restricted Diet Getting too much fat and cholesterol in your diet may cause health problems. Following this diet helps keep your fat and cholesterol at normal levels. This can keep you from getting sick. What types of fat should I choose?  Choose monosaturated and polyunsaturated fats. These are found in foods such as olive oil, canola oil, flaxseeds, walnuts, almonds, and seeds.  Eat more omega-3 fats. Good choices include salmon, mackerel,  sardines, tuna, flaxseed oil, and ground flaxseeds.  Limit saturated fats. These are in animal products such as meats, butter, and cream. They can also be in plant products such as palm oil, palm kernel oil, and coconut oil.  Avoid foods with partially hydrogenated oils in them. These contain trans fats. Examples of foods that have trans fats are stick margarine, some tub margarines, cookies, crackers, and other baked goods. What general guidelines do I need to follow?  Check food labels. Look for the words "trans fat" and "saturated fat."  When preparing a meal: ? Fill half of your plate with vegetables and green salads. ? Fill one fourth of your plate with whole grains. Look for the word "whole" as the first word in the ingredient list. ? Fill one fourth of your plate with lean protein foods.  Eat more foods that have fiber, like apples, carrots, beans, peas, and barley.  Eat more home-cooked foods. Eat less at restaurants and buffets.  Limit or avoid alcohol.  Limit foods high in starch and sugar.  Limit fried foods.  Cook foods without frying them. Baking, boiling, grilling, and broiling are all great options.  Lose weight if you are overweight. Losing even a small amount of weight can help your overall health. It can also help prevent diseases such as diabetes and heart disease. What foods can I eat? Grains Whole grains, such as whole wheat or whole grain breads,  crackers, cereals, and pasta. Unsweetened oatmeal, bulgur, barley, quinoa, or brown rice. Corn or whole wheat flour tortillas. Vegetables Fresh or frozen vegetables (raw, steamed, roasted, or grilled). Green salads. Fruits All fresh, canned (in natural juice), or frozen fruits. Meat and Other Protein Products Ground beef (85% or leaner), grass-fed beef, or beef trimmed of fat. Skinless chicken or [place]. Ground chicken or [place]. Pork trimmed of fat. All fish and seafood. Eggs. Dried beans, peas, or lentils. Unsalted  nuts or seeds. Unsalted canned or dry beans. Dairy Low-fat dairy products, such as skim or 1% milk, 2% or reduced-fat cheeses, low-fat ricotta or cottage cheese, or plain low-fat yogurt. Fats and Oils Tub margarines without trans fats. Light or reduced-fat mayonnaise and salad dressings. Avocado. Olive, canola, sesame, or safflower oils. Natural peanut or almond butter (choose ones without added sugar and oil). The items listed above may not be a complete list of recommended foods or beverages. Contact your dietitian for more options. What foods are not recommended? Grains White bread. White pasta. White rice. Cornbread. Bagels, pastries, and croissants. Crackers that contain trans fat. Vegetables White potatoes. Corn. Creamed or fried vegetables. Vegetables in a cheese sauce. Fruits Dried fruits. Canned fruit in light or heavy syrup. Fruit juice. Meat and Other Protein Products Fatty cuts of meat. Ribs, chicken wings, bacon, sausage, bologna, salami, chitterlings, fatback, hot dogs, bratwurst, and packaged luncheon meats. Liver and organ meats. Dairy Whole or 2% milk, cream, half-and-half, and cream cheese. Whole milk cheeses. Whole-fat or sweetened yogurt. Full-fat cheeses. Nondairy creamers and whipped toppings. Processed cheese, cheese spreads, or cheese curds. Sweets and Desserts Corn syrup, sugars, honey, and molasses. Candy. Jam and jelly. Syrup. Sweetened cereals. Cookies, pies, cakes, donuts, muffins, and ice cream. Fats and Oils Butter, stick margarine, lard, shortening, ghee, or bacon fat. Coconut, palm kernel, or palm oils. Beverages Alcohol. Sweetened drinks (such as sodas, lemonade, and fruit drinks or punches). The items listed above may not be a complete list of foods and beverages to avoid. Contact your dietitian for more information. This information is not intended to replace advice given to you by your health care provider. Make sure you discuss any questions you have with  your health care provider. Document Released: 08/29/2011 Document Revised: 11/04/2015 Document Reviewed: 05/29/2013 Elsevier Interactive Patient Education  2018 [place].   High-Fiber Diet Fiber, also called dietary fiber, is a type of carbohydrate found in fruits, vegetables, whole grains, and beans. A high-fiber diet can have many health benefits. Your health care provider may recommend a high-fiber diet to help:  Prevent constipation. Fiber can make your bowel movements more regular.  Lower your cholesterol.  Relieve hemorrhoids, uncomplicated diverticulosis, or irritable bowel syndrome.  Prevent overeating as part of a weight-loss plan.  Prevent heart disease, type 2 diabetes, and certain cancers.  What is my plan? The recommended daily intake of fiber includes:  38 grams for men under age 75.  64 grams for men over age 2.  60 grams for women under age 43.  58 grams for women over age 74.  You can get the recommended daily intake of dietary fiber by eating a variety of fruits, vegetables, grains, and beans. Your health care provider may also recommend a fiber supplement if it is not possible to get enough fiber through your diet. What do I need to know about a high-fiber diet?  Fiber supplements have not been widely studied for their effectiveness, so it is better to get fiber through food sources.  Always check the fiber content on thenutrition facts label of any prepackaged food. Look for foods that contain at least 5 grams of fiber per serving.  Ask your dietitian if you have questions about specific foods that are related to your condition, especially if those foods are not listed in the following section.  Increase your daily fiber consumption gradually. Increasing your intake of dietary fiber too quickly may cause bloating, cramping, or gas.  Drink plenty of water. Water helps you to digest fiber. What foods can I eat? Grains Whole-grain breads. Multigrain  cereal. Oats and oatmeal. Brown rice. Barley. Bulgur wheat. [place]. Bran muffins. Popcorn. Rye wafer crackers. Vegetables Sweet potatoes. Spinach. Kale. Artichokes. Cabbage. Broccoli. Green peas. Carrots. Squash. Fruits Berries. Pears. Apples. Oranges. Avocados. Prunes and raisins. Dried figs. Meats and Other Protein Sources [place], kidney, pinto, and soy beans. Split peas. Lentils. Nuts and seeds. Dairy Fiber-fortified yogurt. Beverages Fiber-fortified soy milk. Fiber-fortified orange juice. Other Fiber bars. The items listed above may not be a complete list of recommended foods or beverages. Contact your dietitian for more options. What foods are not recommended? Grains White bread. Pasta made with refined flour. White rice. Vegetables Fried potatoes. Canned vegetables. Well-cooked vegetables. Fruits Fruit juice. Cooked, strained fruit. Meats and Other Protein Sources Fatty cuts of meat. Fried Sales executive or fried fish. Dairy Milk. Yogurt. Cream cheese. Sour cream. Beverages Soft drinks. Other Cakes and pastries. Butter and oils. The items listed above may not be a complete list of foods and beverages to avoid. Contact your dietitian for more information. What are some tips for including high-fiber foods in my diet?  Eat a wide variety of high-fiber foods.  Make sure that half of all grains consumed each day are whole grains.  Replace breads and cereals made from refined flour or white flour with whole-grain breads and cereals.  Replace white rice with brown rice, bulgur wheat, or millet.  Start the day with a breakfast that is high in fiber, such as a cereal that contains at least 5 grams of fiber per serving.  Use beans in place of meat in soups, salads, or pasta.  Eat high-fiber snacks, such as berries, raw vegetables, nuts, or popcorn. This information is not intended to replace advice given to you by your health care provider. Make sure you discuss any questions you have  with your health care provider. Document Released: 02/27/2005 Document Revised: 08/05/2015 Document Reviewed: 08/12/2013 Elsevier Interactive Patient Education  2018 [employer].   Carbohydrate Counting for Diabetes Mellitus, Adult Carbohydrate counting is a method for keeping track of how many carbohydrates you eat. Eating carbohydrates naturally increases the amount of sugar (glucose) in the blood. Counting how many carbohydrates you eat helps keep your blood glucose within normal limits, which helps you manage your diabetes (diabetes mellitus). It is important to know how many carbohydrates you can safely have in each meal. This is different for every person. A diet and nutrition specialist (registered dietitian) can help you make a meal plan and calculate how many carbohydrates you should have at each meal and snack. Carbohydrates are found in the following foods:  Grains, such as breads and cereals.  Dried beans and soy products.  Starchy vegetables, such as potatoes, peas, and corn.  Fruit and fruit juices.  Milk and yogurt.  Sweets and snack foods, such as cake, cookies, candy, chips, and soft drinks.  How do I count carbohydrates? There are two ways to count carbohydrates in food. You can use either  of the methods or a combination of both. Reading "Nutrition Facts" on packaged food The "Nutrition Facts" list is included on the labels of almost all packaged foods and beverages in the [place] It includes:  The serving size.  Information about nutrients in each serving, including the grams (g) of carbohydrate per serving.  To use the "Nutrition Facts":  Decide how many servings you will have.  Multiply the number of servings by the number of carbohydrates per serving.  The resulting number is the total amount of carbohydrates that you will be having.  Learning standard serving sizes of other foods When you eat foods containing carbohydrates that are not packaged or do not  include "Nutrition Facts" on the label, you need to measure the servings in order to count the amount of carbohydrates:  Measure the foods that you will eat with a food scale or measuring cup, if needed.  Decide how many standard-size servings you will eat.  Multiply the number of servings by 15. Most carbohydrate-rich foods have about 15 g of carbohydrates per serving. ? For example, if you eat 8 oz (170 g) of strawberries, you will have eaten 2 servings and 30 g of carbohydrates (2 servings x 15 g = 30 g).  For foods that have more than one food mixed, such as soups and casseroles, you must count the carbohydrates in each food that is included.  The following list contains standard serving sizes of common carbohydrate-rich foods. Each of these servings has about 15 g of carbohydrates:   hamburger bun or  English muffin.   oz (15 mL) syrup.   oz (14 g) jelly.  1 slice of bread.  1 six-inch tortilla.  3 oz (85 g) cooked rice or pasta.  4 oz (113 g) cooked dried beans.  4 oz (113 g) starchy vegetable, such as peas, corn, or potatoes.  4 oz (113 g) hot cereal.  4 oz (113 g) mashed potatoes or  of a large baked potato.  4 oz (113 g) canned or frozen fruit.  4 oz (120 mL) fruit juice.  4-6 crackers.  6 chicken nuggets.  6 oz (170 g) unsweetened dry cereal.  6 oz (170 g) plain fat-free yogurt or yogurt sweetened with artificial sweeteners.  8 oz (240 mL) milk.  8 oz (170 g) fresh fruit or one small piece of fruit.  24 oz (680 g) popped popcorn.  Example of carbohydrate counting Sample meal  3 oz (85 g) chicken breast.  6 oz (170 g) brown rice.  4 oz (113 g) corn.  8 oz (240 mL) milk.  8 oz (170 g) strawberries with sugar-free whipped topping. Carbohydrate calculation 1. Identify the foods that contain carbohydrates: ? Rice. ? Corn. ? Milk. ? Strawberries. 2. Calculate how many servings you have of each food: ? 2 servings rice. ? 1 serving  corn. ? 1 serving milk. ? 1 serving strawberries. 3. Multiply each number of servings by 15 g: ? 2 servings rice x 15 g = 30 g. ? 1 serving corn x 15 g = 15 g. ? 1 serving milk x 15 g = 15 g. ? 1 serving strawberries x 15 g = 15 g. 4. Add together all of the amounts to find the total grams of carbohydrates eaten: ? 30 g + 15 g + 15 g + 15 g = 75 g of carbohydrates total. This information is not intended to replace advice given to you by your health care provider. Make sure  you discuss any questions you have with your health care provider. Document Released: 02/27/2005 Document Revised: 09/17/2015 Document Reviewed: 08/11/2015 Elsevier Interactive Patient Education  [employer].

## 2017-07-26 DIAGNOSIS — R1310 Dysphagia, unspecified: Secondary | ICD-10-CM | POA: Insufficient documentation

## 2017-07-26 DIAGNOSIS — R5383 Other fatigue: Secondary | ICD-10-CM | POA: Insufficient documentation

## 2017-07-26 LAB — URINE CULTURE
MICRO NUMBER: 90591820
SPECIMEN QUALITY: ADEQUATE

## 2017-07-26 NOTE — Assessment & Plan Note (Signed)
Check lipid panel  Continue daily statin Regular exercise and healthy diet encouraged  

## 2017-07-26 NOTE — Assessment & Plan Note (Signed)
Has discussed this with gyn - discussed possibly seeing urology Will check ua, ucx Consider urology referral

## 2017-07-26 NOTE — Assessment & Plan Note (Signed)
BP well controlled Current regimen effective and well tolerated Continue current medications at current doses cmp  

## 2017-07-26 NOTE — Assessment & Plan Note (Signed)
Takes xanax rarely  Will continue

## 2017-07-26 NOTE — Assessment & Plan Note (Signed)
Fatigue for a couple of months - worse after colonoscopy last week, but has started to get better Check labs - cbc, tsh, cmp It is improving so hopefully will continue to improve Restart regular exercise Maintain good sleep

## 2017-07-26 NOTE — Assessment & Plan Note (Signed)
Still has some difficulty swallowing at times EGD normal, esophagus was dilated ? Motility issues Has follow up with GI Chew food thoroughly Drink water with eating

## 2017-07-26 NOTE — Assessment & Plan Note (Signed)
Check a1c Low sugar / carb diet Stressed regular exercise, weight loss  

## 2017-07-28 ENCOUNTER — Encounter: Payer: Self-pay | Admitting: Internal Medicine

## 2017-08-01 ENCOUNTER — Encounter: Payer: Self-pay | Admitting: Gastroenterology

## 2017-08-13 NOTE — Progress Notes (Signed)
Cardiology Office Note   Date:  08/14/2017   ID:  Shelley Hayes, DOB [birth year], MRN [id number]  PCP:  Binnie Rail, MD  Cardiologist:   Jenkins Rouge, MD   No chief complaint on file.     History of Present Illness: Shelley Hayes is a 72 y.o. female who presents for consultation regarding palpitations and heart fluttering. Referred by Dr Quay Burow.  Reviewed her office note from 07/25/17  Patient has history of HTN, HLD , PreDiabetes, and anxiety for which she takes xanax. She has had recent fatigue   Initially noted 04/24/17 and thought to be related to norvasc which was d/c Also noted atypical chest chest and jaw pain Labs ok including normal TSH, Hct 40.6 and normal renal function  She had a normal nuclear study for chest pian in 2015  Palpitations are not related to exertion intermittent flips no long runs of tachycardia She has been having them since February More exertional dyspnea and atypical Chest pain sharp in middle of chest with dyspnea   Past Medical History:  Diagnosis Date  . ALLERGIC RHINITIS   . Allergic rhinitis 08/23/2009  . Allergy   . Anxiety   . ARTHRITIS   . Arthritis   . Blood in stool 04/24/2017  . Cataract    bil cataracts removes  . Cervical paraspinal muscle spasm 12/23/2015  . Chest pain 04/24/2017  . CHEST PAIN 12/19/2009   Qualifier: Diagnosis of  By: Marca Ancona RMA, Lucy    . COLONIC POLYPS, HX OF 08/23/2009   Qualifier: Diagnosis of  By: Marca Ancona RMA, Lucy    . Cough 06/19/2016  . Fluttering heart 12/23/2015  . GERD (gastroesophageal reflux disease)   . Glaucoma   . GLUCOMA   . Heart murmur   . Herpes zoster without complication 2/59/5638  . Hyperglycemia 06/19/2016  . HYPERLIPIDEMIA   . HYPERTENSION   . KNEE PAIN 08/23/2009   Qualifier: Diagnosis of  By: Asa Lente MD, Jannifer Rodney Muscle cramps 06/19/2016  . Osteopenia 04/26/2016   2/18 - dexa - RFN and LFN  -1.2  . OTITIS EXTERNA, RIGHT 01/20/2010   Qualifier: Diagnosis of  By: Asa Lente MD, Jannifer Rodney Prediabetes 04/24/2017  . SCIATICA, LEFT 11/2010    Past Surgical History:  Procedure Laterality Date  . BREAST BIOPSY    . COLONOSCOPY    . TUBAL LIGATION       Current Outpatient Medications  Medication Sig Dispense Refill  . albuterol (PROVENTIL HFA;VENTOLIN HFA) 108 (90 Base) MCG/ACT inhaler Inhale 2 puffs into the lungs every 6 (six) hours as needed for wheezing or shortness of breath. 1 Inhaler 5  . ALPRAZolam (XANAX) 0.5 MG tablet Take 1 tablet (0.5 mg total) by mouth 2 (two) times daily as needed for anxiety or sleep. 30 tablet 0  . amLODipine (NORVASC) 10 MG tablet Take 10 mg by mouth daily.  3  . aspirin 81 MG tablet Take 81 mg by mouth daily.      Marland Kitchen atorvastatin (LIPITOR) 40 MG tablet TAKE 1 TABLET BY MOUTH EVERY DAY 90 tablet 3  . Cholecalciferol (VITAMIN D3) 1000 UNITS CAPS Take 1 capsule (1,000 Units total) by mouth daily. 30 capsule   . fluticasone (FLONASE) 50 MCG/ACT nasal spray PLACE 2 SPRAYS INTO BOTH NOSTRILS DAILY. 16 g 2  . hydrochlorothiazide (MICROZIDE) 12.5 MG capsule Take 1 capsule (12.5 mg total) by mouth daily. 90 capsule 0  . latanoprost (XALATAN) 0.005 %  ophthalmic solution Place 1 drop into both eyes at bedtime.  6  . Multiple Vitamin (MULTIVITAMIN) tablet Take 1 tablet by mouth daily.      Marland Kitchen omeprazole (PRILOSEC) 40 MG capsule Take 1 capsule (40 mg total) by mouth daily. 30 capsule 11  . vitamin B-12 (CYANOCOBALAMIN) 1000 MCG tablet Take 1 tablet (1,000 mcg total) by mouth daily.     No current facility-administered medications for this visit.     Allergies:   Hydrocodone-homatropine and Losartan    Social History:  The patient  reports that she has never smoked. She has never used smokeless tobacco. She reports that she does not drink alcohol or use drugs.   Family History:  The patient's family history includes Alcohol abuse in her brother; Arthritis in her father and mother; Colon cancer (age of onset: 63) in her mother; Diabetes in her  brother and mother; Hypertension in her brother; Lung cancer in her brother and other; Prostate cancer in her brother.    ROS:  Please see the history of present illness.   Otherwise, review of systems are positive for none.   All other systems are reviewed and negative.    PHYSICAL EXAM: VS:  BP 130/74   Pulse 83   Ht 4\' 9"  (1.448 m)   Wt 197 lb 4 oz (89.5 kg)   SpO2 99%   BMI 42.68 kg/m  , BMI Body mass index is 42.68 kg/m. Affect appropriate Obese black female  HEENT: normal Neck supple with no adenopathy JVP normal no bruits no thyromegaly Lungs clear with no wheezing and good diaphragmatic motion Heart:  S1/S2 SEM murmur, no rub, gallop or click PMI normal Abdomen: benighn, BS positve, no tenderness, no AAA no bruit.  No HSM or HJR Distal pulses intact with no bruits No edema Neuro non-focal Skin warm and dry No muscular weakness    EKG:  NSR rate 86 poor R wave progression non specific ST changes Q in lead 3    Recent Labs: 07/25/2017: ALT 21; BUN 13; Creatinine, Ser 0.89; Hemoglobin 13.7; Platelets 342.0; Potassium 3.5; Sodium 142; TSH 1.00    Lipid Panel    Component Value Date/Time   CHOL 190 07/25/2017 1011   TRIG 119.0 07/25/2017 1011   HDL 45.80 07/25/2017 1011   CHOLHDL 4 07/25/2017 1011   VLDL 23.8 07/25/2017 1011   LDLCALC 120 (H) 07/25/2017 1011   LDLDIRECT 198.9 12/30/2012 1037      Wt Readings from Last 3 Encounters:  08/14/17 197 lb 4 oz (89.5 kg)  07/25/17 197 lb (89.4 kg)  07/25/17 197 lb (89.4 kg)      Other studies Reviewed: Additional studies/ records that were reviewed today include: Notes from primary in February and May. ECG, labs myovue 2015.    ASSESSMENT AND PLAN:  1.  Palpitations benign sounding f/u event monitor  2. Chest Pain : atypical ECG low voltage due to body habitus f/u exercise myovue  3. HTN :  Well controlled.  Continue current medications and low sodium Dash type diet.   4. Anxiety :  PRN xanax   5. HLD:   Continue statin labs with primary  6. Dyspnea:  Likely functional but with SEM will order TTE for RV/LV function    Current medicines are reviewed at length with the patient today.  The patient does not have concerns regarding medicines.  The following changes have been made:  None   Labs/ tests ordered today include: Ex Myovue, TTE and event  monitor   Orders Placed This Encounter  Procedures  . CARDIAC EVENT MONITOR  . MYOCARDIAL PERFUSION IMAGING  . EKG 12-Lead  . ECHOCARDIOGRAM COMPLETE     Disposition:   FU with cardiology PRN      Signed, Jenkins Rouge, MD  08/14/2017 9:55 AM    [address], [place], [place]  [address] Phone: [phone]; Fax: [phone]

## 2017-08-14 ENCOUNTER — Ambulatory Visit (INDEPENDENT_AMBULATORY_CARE_PROVIDER_SITE_OTHER): Payer: Medicare Other | Admitting: Cardiovascular Disease

## 2017-08-14 ENCOUNTER — Encounter: Payer: Self-pay | Admitting: Cardiovascular Disease

## 2017-08-14 VITALS — BP 130/74 | HR 83 | Ht <= 58 in | Wt 197.2 lb

## 2017-08-14 DIAGNOSIS — R079 Chest pain, unspecified: Secondary | ICD-10-CM | POA: Diagnosis not present

## 2017-08-14 DIAGNOSIS — R002 Palpitations: Secondary | ICD-10-CM | POA: Diagnosis not present

## 2017-08-14 NOTE — Patient Instructions (Addendum)
Medication Instructions:  Your physician recommends that you continue on your current medications as directed. Please refer to the Current Medication list given to you today.  Labwork: NONE  Testing/Procedures: Your physician has recommended that you wear an event monitor. Event monitors are medical devices that record the heart's electrical activity. Doctors most often [place] these monitors to diagnose arrhythmias. Arrhythmias are problems with the speed or rhythm of the heartbeat. The monitor is a small, portable device. You can wear one while you do your normal daily activities. This is usually used to diagnose what is causing palpitations/syncope (passing out).  Your physician has requested that you have an echocardiogram. Echocardiography is a painless test that uses sound waves to create images of your heart. It provides your doctor with information about the size and shape of your heart and how well your heart's chambers and valves are working. This procedure takes approximately one hour. There are no restrictions for this procedure.  Your physician has requested that you have en exercise stress myoview, 2 day. For further information please visit [email or web address]. Please follow instruction sheet, as given.  Follow-Up: Your physician wants you to follow-up as needed with Dr. Johnsie Cancel.    If you need a refill on your cardiac medications before your next appointment, please call your pharmacy.

## 2017-08-27 ENCOUNTER — Telehealth (HOSPITAL_COMMUNITY): Payer: Self-pay | Admitting: *Deleted

## 2017-08-27 NOTE — Telephone Encounter (Signed)
Left message on voicemail in reference to upcoming appointment scheduled for 08/29/17 Phone number given for a call back so details instructions can be given.  Shelley Hayes

## 2017-08-28 ENCOUNTER — Telehealth (HOSPITAL_COMMUNITY): Payer: Self-pay | Admitting: *Deleted

## 2017-08-28 NOTE — Telephone Encounter (Signed)
Patient given detailed instructions per Myocardial Perfusion Study Information Sheet for the test on 08/29/17 at 1230. Patient notified to arrive 15 minutes early and that it is imperative to arrive on time for appointment to keep from having the test rescheduled.  If you need to cancel or reschedule your appointment, please call the office within 24 hours of your appointment. . Patient verbalized understanding.Shelley Hayes, Caren Griffins W]

## 2017-08-29 ENCOUNTER — Other Ambulatory Visit: Payer: Self-pay

## 2017-08-29 ENCOUNTER — Ambulatory Visit (HOSPITAL_COMMUNITY): Payer: Medicare Other | Attending: Cardiology

## 2017-08-29 ENCOUNTER — Ambulatory Visit (HOSPITAL_BASED_OUTPATIENT_CLINIC_OR_DEPARTMENT_OTHER): Payer: Medicare Other

## 2017-08-29 DIAGNOSIS — R9439 Abnormal result of other cardiovascular function study: Secondary | ICD-10-CM | POA: Diagnosis not present

## 2017-08-29 DIAGNOSIS — R079 Chest pain, unspecified: Secondary | ICD-10-CM | POA: Insufficient documentation

## 2017-08-29 DIAGNOSIS — R002 Palpitations: Secondary | ICD-10-CM

## 2017-08-29 LAB — ECHOCARDIOGRAM COMPLETE
Height: 57 in
Weight: 3152 oz

## 2017-08-29 MED ORDER — TECHNETIUM TC 99M TETROFOSMIN IV KIT
31.9000 | PACK | Freq: Once | INTRAVENOUS | Status: AC | PRN
  Administered 2017-08-29: 31.9 via INTRAVENOUS
  Filled 2017-08-29: qty 32

## 2017-08-30 ENCOUNTER — Ambulatory Visit (INDEPENDENT_AMBULATORY_CARE_PROVIDER_SITE_OTHER): Payer: Medicare Other

## 2017-08-30 ENCOUNTER — Ambulatory Visit (HOSPITAL_COMMUNITY): Payer: Medicare Other | Attending: Cardiovascular Disease

## 2017-08-30 DIAGNOSIS — R002 Palpitations: Secondary | ICD-10-CM

## 2017-08-30 DIAGNOSIS — R079 Chest pain, unspecified: Secondary | ICD-10-CM

## 2017-08-30 LAB — MYOCARDIAL PERFUSION IMAGING
CHL CUP RESTING HR STRESS: 96 {beats}/min
CSEPED: 4 min
CSEPHR: 89 %
Estimated workload: 6.4 METS
Exercise duration (sec): 30 s
LVDIAVOL: 69 mL (ref 46–106)
LVSYSVOL: 28 mL
MPHR: 149 {beats}/min
Peak HR: 134 {beats}/min
RATE: 0.47
SDS: 1
SRS: 2
SSS: 3
TID: 0.95

## 2017-08-30 MED ORDER — TECHNETIUM TC 99M TETROFOSMIN IV KIT
30.4000 | PACK | Freq: Once | INTRAVENOUS | Status: AC | PRN
  Administered 2017-08-30: 30.4 via INTRAVENOUS
  Filled 2017-08-30: qty 31

## 2017-09-01 ENCOUNTER — Other Ambulatory Visit: Payer: Self-pay | Admitting: Internal Medicine

## 2017-09-02 ENCOUNTER — Other Ambulatory Visit: Payer: Self-pay | Admitting: Internal Medicine

## 2017-09-03 NOTE — Progress Notes (Signed)
Cardiology Office Note   Date:  09/10/2017   ID:  Shelley Hayes, DOB 04/24/1945, MRN [id number]  PCP:  Binnie Rail, MD  Cardiologist:   Jenkins Rouge, MD   No chief complaint on file.     History of Present Illness:  72 y.o. seen for palpitations 08/14/17 CRF;s HTN and HLD, glucose intolerance. Palpitations were worse with norvasc which was d/c. She also had chest pain radiating to jaw. Normal nuclear study in 2015 Myovue done 08/30/17 reviewed suggested ischemia in the anterior wall and apex EF 59% TTE done 08/29/17 reviewed MAC no other valve disease and EF 65-70%   Event monitor :  Reviewed express report and only occasional PVC;s   Discussed abnormal myovue with her in setting of age and CRF;s She is walking 2 plus miles / day with no chest pain or symptoms Given her body habitus I strongly suspect the myovue is false positive  We agreed to observe and not proceed with cath     Past Medical History:  Diagnosis Date  . ALLERGIC RHINITIS   . Allergic rhinitis 08/23/2009  . Allergy   . Anxiety   . ARTHRITIS   . Arthritis   . Blood in stool 04/24/2017  . Cataract    bil cataracts removes  . Cervical paraspinal muscle spasm 12/23/2015  . Chest pain 04/24/2017  . CHEST PAIN 12/19/2009   Qualifier: Diagnosis of  By: Marca Ancona RMA, Lucy    . COLONIC POLYPS, HX OF 08/23/2009   Qualifier: Diagnosis of  By: Marca Ancona RMA, Lucy    . Cough 06/19/2016  . Fluttering heart 12/23/2015  . GERD (gastroesophageal reflux disease)   . Glaucoma   . GLUCOMA   . Heart murmur   . Herpes zoster without complication 06/04/4980  . Hyperglycemia 06/19/2016  . HYPERLIPIDEMIA   . HYPERTENSION   . KNEE PAIN 08/23/2009   Qualifier: Diagnosis of  By: Asa Lente MD, Jannifer Rodney Muscle cramps 06/19/2016  . Osteopenia 04/26/2016   2/18 - dexa - RFN and LFN  -1.2  . OTITIS EXTERNA, RIGHT 01/20/2010   Qualifier: Diagnosis of  By: Asa Lente MD, Jannifer Rodney Prediabetes 04/24/2017  . SCIATICA, LEFT 11/2010     Past Surgical History:  Procedure Laterality Date  . BREAST BIOPSY Right    benign  . COLONOSCOPY    . ESOPHAGOGASTRODUODENOSCOPY    . TUBAL LIGATION       Current Outpatient Medications  Medication Sig Dispense Refill  . albuterol (PROVENTIL HFA;VENTOLIN HFA) 108 (90 Base) MCG/ACT inhaler Inhale 2 puffs into the lungs every 6 (six) hours as needed for wheezing or shortness of breath. 1 Inhaler 5  . ALPRAZolam (XANAX) 0.5 MG tablet Take 1 tablet (0.5 mg total) by mouth 2 (two) times daily as needed for anxiety or sleep. 30 tablet 0  . amLODipine (NORVASC) 10 MG tablet TAKE 1 TABLET BY MOUTH EVERY DAY 90 tablet 0  . aspirin 81 MG tablet Take 81 mg by mouth daily.      Marland Kitchen atorvastatin (LIPITOR) 40 MG tablet TAKE 1 TABLET BY MOUTH EVERY DAY 90 tablet 3  . Cholecalciferol (VITAMIN D3) 1000 UNITS CAPS Take 1 capsule (1,000 Units total) by mouth daily. 30 capsule   . fluticasone (FLONASE) 50 MCG/ACT nasal spray PLACE 2 SPRAYS INTO BOTH NOSTRILS DAILY. 16 g 2  . hydrochlorothiazide (MICROZIDE) 12.5 MG capsule TAKE 1 CAPSULE BY MOUTH EVERY DAY 90 capsule 0  . latanoprost (XALATAN)  0.005 % ophthalmic solution Place 1 drop into both eyes at bedtime.  6  . Multiple Vitamin (MULTIVITAMIN) tablet Take 1 tablet by mouth daily.      Marland Kitchen omeprazole (PRILOSEC) 40 MG capsule Take 1 capsule (40 mg total) by mouth daily. 30 capsule 11  . vitamin B-12 (CYANOCOBALAMIN) 1000 MCG tablet Take 1 tablet (1,000 mcg total) by mouth daily.     No current facility-administered medications for this visit.     Allergies:   Hydrocodone-homatropine and Losartan    Social History:  The patient  reports that she has never smoked. She has never used smokeless tobacco. She reports that she does not drink alcohol or use drugs.   Family History:  The patient's family history includes Alcohol abuse in her brother; Arthritis in her father and mother; Colon cancer (age of onset: 58) in her mother; Diabetes in her brother  and mother; Hypertension in her brother; Lung cancer in her brother and other; Prostate cancer in her brother.    ROS:  Please see the history of present illness.   Otherwise, review of systems are positive for none.   All other systems are reviewed and negative.    PHYSICAL EXAM: VS:  BP 112/76   Pulse 82   Ht _0  (1.448 m)   Wt 201 lb (91.2 kg)   SpO2 96%   BMI 43.50 kg/m  , BMI Body mass index is 43.5 kg/m. Affect appropriate Healthy:  appears stated age 46: normal Neck supple with no adenopathy JVP normal no bruits no thyromegaly Lungs clear with no wheezing and good diaphragmatic motion Heart:  S1/S2 SEM  murmur, no rub, gallop or click PMI normal Abdomen: benighn, BS positve, no tenderness, no AAA no bruit.  No HSM or HJR Distal pulses intact with no bruits No edema Neuro non-focal Skin warm and dry No muscular weakness    EKG:  NSR rate 86 poor R wave progression non specific ST changes Q in lead 3    Recent Labs: 07/25/2017: ALT 21; BUN 13; Creatinine, Ser 0.89; Hemoglobin 13.7; Platelets 342.0; Potassium 3.5; Sodium 142; TSH 1.00    Lipid Panel    Component Value Date/Time   CHOL 190 07/25/2017 1011   TRIG 119.0 07/25/2017 1011   HDL 45.80 07/25/2017 1011   CHOLHDL 4 07/25/2017 1011   VLDL 23.8 07/25/2017 1011   LDLCALC 120 (H) 07/25/2017 1011   LDLDIRECT 198.9 12/30/2012 1037      Wt Readings from Last 3 Encounters:  09/10/17 201 lb (91.2 kg)  09/05/17 203 lb (92.1 kg)  08/29/17 197 lb (89.4 kg)      Other studies Reviewed: Additional studies/ records that were reviewed today include: Notes from primary in February and May. ECG, labs myovue 2015.    ASSESSMENT AND PLAN:  1.  Palpitations benign sounding event monitor with occasional PVC observe  2. Chest Pain : resolved active walking 2 miles./ day likely false positive myovue With large chest/breast tissue observe  3. HTN :  Well controlled.  Continue current medications and low  sodium Dash type diet.   4. Anxiety :  PRN xanax   5. HLD:  Continue statin labs with primary  6. Dyspnea:  Likely functional normal EF by myovue and TTE no valve disease    Current medicines are reviewed at length with the patient today.  The patient does not have concerns regarding medicines.  The following changes have been made:  None   Labs/ tests ordered  today include: None   No orders of the defined types were placed in this encounter.    Jenkins Rouge

## 2017-09-05 ENCOUNTER — Encounter: Payer: Self-pay | Admitting: Gastroenterology

## 2017-09-05 ENCOUNTER — Ambulatory Visit (INDEPENDENT_AMBULATORY_CARE_PROVIDER_SITE_OTHER): Payer: Medicare Other | Admitting: Gastroenterology

## 2017-09-05 VITALS — BP 120/70 | HR 80 | Ht <= 58 in | Wt 203.0 lb

## 2017-09-05 DIAGNOSIS — Z86010 Personal history of colonic polyps: Secondary | ICD-10-CM | POA: Diagnosis not present

## 2017-09-05 NOTE — Progress Notes (Signed)
    History of Present Illness: This is a 72 year old female with numerous adenomatous colon polyps.  She has no complaints today.  She noted no bleeding following her colonoscopy.  COLONOSCOPY 07/2017: - 34 5 to 8 mm polyps in the rectum, in the sigmoid colon, in the descending colon, in the transverse colon, in the ascending colon and in the cecum, removed with a cold snare.  Resected and retrieved all tubular adenomas.  - Multiple 3 to 5 mm polyps in the transverse descending and sigmoid colon not removed.   - Two 4 mm polyps in the transverse colon and at the hepatic flexure, removed with a cold biopsy forceps. Resected and retrieved.  - Two 11 to 14 mm polyps in the distal sigmoid colon, removed with a hot snare. Resected and retrieved.  - Internal hemorrhoids.  - The examination was otherwise normal on direct and retroflexion views.   Current Medications, Allergies, Past Medical History, Past Surgical History, Family History and Social History were reviewed in [employer] record.  Physical Exam: General: Well developed, well nourished, no acute distress Head: Normocephalic and atraumatic Eyes:  sclerae anicteric, EOMI Ears: Normal auditory acuity Neurological: Alert oriented x 4, grossly nonfocal Psychological:  Alert and cooperative. Normal mood and affect  Assessment and Recommendations:  1. Personal history numerous adenomatous colon polyps.  We discussed results of pathology and multiple remaining polyps.  I recommended she proceed with genetic testing. She will further consider and contact us if she wants to proceed.  Colonoscopy recommended in November 2019.  I spent 15 minutes of face-to-face time with the patient. Greater than 50% of the time was spent counseling and coordinating care.

## 2017-09-05 NOTE — Patient Instructions (Signed)
Call our office back if you want to see a genetic counselor.   Thank you for choosing me and [facility].  Pricilla Riffle. Dagoberto Ligas., MD., Marval Regal

## 2017-09-10 ENCOUNTER — Encounter: Payer: Self-pay | Admitting: Cardiovascular Disease

## 2017-09-10 ENCOUNTER — Ambulatory Visit (INDEPENDENT_AMBULATORY_CARE_PROVIDER_SITE_OTHER): Payer: Medicare Other | Admitting: Cardiovascular Disease

## 2017-09-10 VITALS — BP 112/76 | HR 82 | Ht <= 58 in | Wt 201.0 lb

## 2017-09-10 DIAGNOSIS — R002 Palpitations: Secondary | ICD-10-CM

## 2017-09-10 NOTE — Patient Instructions (Signed)

## 2017-09-19 ENCOUNTER — Ambulatory Visit: Payer: Medicare Other | Admitting: Gastroenterology

## 2017-09-19 DIAGNOSIS — H401114 Primary open-angle glaucoma, right eye, indeterminate stage: Secondary | ICD-10-CM | POA: Diagnosis not present

## 2017-09-19 DIAGNOSIS — H401122 Primary open-angle glaucoma, left eye, moderate stage: Secondary | ICD-10-CM | POA: Diagnosis not present

## 2017-11-30 ENCOUNTER — Ambulatory Visit (INDEPENDENT_AMBULATORY_CARE_PROVIDER_SITE_OTHER): Payer: Medicare Other | Admitting: Internal Medicine

## 2017-11-30 ENCOUNTER — Encounter: Payer: Self-pay | Admitting: Internal Medicine

## 2017-11-30 DIAGNOSIS — H8110 Benign paroxysmal vertigo, unspecified ear: Secondary | ICD-10-CM | POA: Diagnosis not present

## 2017-11-30 MED ORDER — MECLIZINE HCL 12.5 MG PO TABS: 12.5000 mg | ORAL_TABLET | Freq: Three times a day (TID) | ORAL | 0 refills | Status: DC | PRN

## 2017-11-30 NOTE — Progress Notes (Signed)
Subjective:    Patient ID: Shelley Hayes, female    DOB: Jul 04, 1945, 72 y.o.   MRN: [id number]  HPI The patient is here for an acute visit.   Vertigo:  It started one week ago.  It is intermittent.  She feels unblanced and is using a cane. She was nauseous and vomiting 5 days ago, but only vomited once. It will occur with bending over, getting out of and rolling in bed and changing positions. Her symptoms are better if she sits still up and keep her head up.  It feels like something is in her right ear.  She has tried otc sinus medication and ear drop for dizziness (herbal). She has some ear pain and sinus pressure that is controlled with the sinus medication.     Medications and allergies reviewed with patient and updated if appropriate.  Patient Active Problem List   Diagnosis Date Noted  . Fatigue 07/26/2017  . Dysphagia 07/26/2017  . Incomplete emptying of bladder 07/25/2017  . Chest pain 04/24/2017  . Prediabetes 04/24/2017  . Herpes zoster without complication 97/67/3419  . Blood in stool 04/24/2017  . Anxiety 04/24/2017  . Muscle cramps 06/19/2016  . Cough 06/19/2016  . Osteopenia 04/26/2016  . Fluttering heart 12/23/2015  . Cervical paraspinal muscle spasm 12/23/2015  . SCIATICA, LEFT 01/20/2010  . Hyperlipidemia 08/23/2009  . GLUCOMA 08/23/2009  . Essential hypertension 08/23/2009  . Allergic rhinitis 08/23/2009  . ARTHRITIS 08/23/2009  . KNEE PAIN 08/23/2009    Current Outpatient Medications on File Prior to Visit  Medication Sig Dispense Refill  . albuterol (PROVENTIL HFA;VENTOLIN HFA) 108 (90 Base) MCG/ACT inhaler Inhale 2 puffs into the lungs every 6 (six) hours as needed for wheezing or shortness of breath. 1 Inhaler 5  . ALPRAZolam (XANAX) 0.5 MG tablet Take 1 tablet (0.5 mg total) by mouth 2 (two) times daily as needed for anxiety or sleep. 30 tablet 0  . amLODipine (NORVASC) 10 MG tablet TAKE 1 TABLET BY MOUTH EVERY DAY 90 tablet 0  . aspirin 81 MG  tablet Take 81 mg by mouth daily.      Marland Kitchen atorvastatin (LIPITOR) 40 MG tablet TAKE 1 TABLET BY MOUTH EVERY DAY 90 tablet 3  . Cholecalciferol (VITAMIN D3) 1000 UNITS CAPS Take 1 capsule (1,000 Units total) by mouth daily. 30 capsule   . hydrochlorothiazide (MICROZIDE) 12.5 MG capsule TAKE 1 CAPSULE BY MOUTH EVERY DAY 90 capsule 0  . latanoprost (XALATAN) 0.005 % ophthalmic solution Place 1 drop into both eyes at bedtime.  6  . Multiple Vitamin (MULTIVITAMIN) tablet Take 1 tablet by mouth daily.      Marland Kitchen omeprazole (PRILOSEC) 40 MG capsule Take 1 capsule (40 mg total) by mouth daily. 30 capsule 11  . vitamin B-12 (CYANOCOBALAMIN) 1000 MCG tablet Take 1 tablet (1,000 mcg total) by mouth daily.     No current facility-administered medications on file prior to visit.     Past Medical History:  Diagnosis Date  . ALLERGIC RHINITIS   . Allergic rhinitis 08/23/2009  . Allergy   . Anxiety   . ARTHRITIS   . Arthritis   . Blood in stool 04/24/2017  . Cataract    bil cataracts removes  . Cervical paraspinal muscle spasm 12/23/2015  . Chest pain 04/24/2017  . CHEST PAIN 12/19/2009   Qualifier: Diagnosis of  By: Marca Ancona RMA, Lucy    . COLONIC POLYPS, HX OF 08/23/2009   Qualifier: Diagnosis of  By: Marca Ancona RMA,  Lucy    . Cough 06/19/2016  . Fluttering heart 12/23/2015  . GERD (gastroesophageal reflux disease)   . Glaucoma   . GLUCOMA   . Heart murmur   . Herpes zoster without complication 1/60/7371  . Hyperglycemia 06/19/2016  . HYPERLIPIDEMIA   . HYPERTENSION   . KNEE PAIN 08/23/2009   Qualifier: Diagnosis of  By: Asa Lente MD, Jannifer Rodney Muscle cramps 06/19/2016  . Osteopenia 04/26/2016   2/18 - dexa - RFN and LFN  -1.2  . OTITIS EXTERNA, RIGHT 01/20/2010   Qualifier: Diagnosis of  By: Asa Lente MD, Jannifer Rodney Prediabetes 04/24/2017  . SCIATICA, LEFT 11/2010    Past Surgical History:  Procedure Laterality Date  . BREAST BIOPSY Right    benign  . COLONOSCOPY    . ESOPHAGOGASTRODUODENOSCOPY      . TUBAL LIGATION      Social History   Socioeconomic History  . Marital status: Divorced    Spouse name: Not on file  . Number of children: 2  . Years of education: Not on file  . Highest education level: Not on file  Occupational History  . Occupation: Retired  Scientific laboratory technician  . Financial resource strain: Not on file  . Food insecurity:    Worry: Never true    Inability: Never true  . Transportation needs:    Medical: Yes    Non-medical: Yes  Tobacco Use  . Smoking status: Never Smoker  . Smokeless tobacco: Never Used  Substance and Sexual Activity  . Alcohol use: No    Alcohol/week: 0.0 standard drinks  . Drug use: No  . Sexual activity: Not Currently  Lifestyle  . Physical activity:    Days per week: 3 days    Minutes per session: 30 min  . Stress: Only a little  Relationships  . Social connections:    Talks on phone: More than three times a week    Gets together: More than three times a week    Attends religious service: More than 4 times per year    Active member of club or organization: Yes    Attends meetings of clubs or organizations: More than 4 times per year    Relationship status: Not on file  Other Topics Concern  . Not on file  Social History Narrative   Divorced, lives alone- in [place] for 37yrs but moved "home" to [place] in 2008      Walking regularly, every morning    Family History  Problem Relation Age of Onset  . Arthritis Mother   . Diabetes Mother   . Colon cancer Mother 7  . Arthritis Father   . Alcohol abuse Brother   . Lung cancer Brother   . Prostate cancer Brother   . Diabetes Brother   . Hypertension Brother   . Lung cancer Other        Neice  . Esophageal cancer Neg Hx   . Liver cancer Neg Hx   . Pancreatic cancer Neg Hx   . Rectal cancer Neg Hx   . Stomach cancer Neg Hx     Review of Systems  Constitutional: Negative for chills and fever.  HENT: Positive for ear pain and sinus pressure (controlled with sinus  medication). Negative for congestion, hearing loss, sinus pain and sore throat.   Respiratory: Negative for cough, shortness of breath and wheezing.   Cardiovascular: Positive for palpitations (occ). Negative for chest pain.  Gastrointestinal: Positive for nausea and  vomiting ( x 1).  Neurological: Positive for dizziness. Negative for weakness, light-headedness, numbness and headaches.       Objective:   Vitals:   11/30/17 1418  BP: 132/80  Pulse: 83  Resp: 16  Temp: 98.3 F (36.8 C)  SpO2: 96%   BP Readings from Last 3 Encounters:  11/30/17 132/80  09/10/17 112/76  09/05/17 120/70   Wt Readings from Last 3 Encounters:  11/30/17 209 lb (94.8 kg)  09/10/17 201 lb (91.2 kg)  09/05/17 203 lb (92.1 kg)   Body mass index is 45.23 kg/m.   Physical Exam  Constitutional: She is oriented to person, place, and time. She appears well-developed and well-nourished. No distress.  HENT:  Head: Normocephalic and atraumatic.  Right Ear: External ear normal.  Left Ear: External ear normal.  Mouth/Throat: Oropharynx is clear and moist.  Normal ear canals and TM b/l  Eyes: Pupils are equal, round, and reactive to light. Conjunctivae and EOM are normal.  Neck: Neck supple. No tracheal deviation present. No thyromegaly present.  Cardiovascular: Normal rate, regular rhythm and normal heart sounds.  No murmur heard. Pulmonary/Chest: Effort normal and breath sounds normal. No respiratory distress. She has no wheezes. She has no rales.  Musculoskeletal: She exhibits no edema.  Lymphadenopathy:    She has no cervical adenopathy.  Neurological: She is alert and oriented to person, place, and time. No cranial nerve deficit or sensory deficit.  Normal strength all extremities  Skin: Skin is warm and dry. She is not diaphoretic.           Assessment & Plan:    See Problem List for Assessment and Plan of chronic medical problems.

## 2017-11-30 NOTE — Patient Instructions (Addendum)
Try meclizine three times a day as needed.  You can also try the xanax twice daily as needed.   Continue your sinus medications.      If there is no improvement we can consider PT.      How to Perform the Epley Maneuver The Epley maneuver is an exercise that relieves symptoms of vertigo. Vertigo is the feeling that you or your surroundings are moving when they are not. When you feel vertigo, you may feel like the room is spinning and have trouble walking. Dizziness is a little different than vertigo. When you are dizzy, you may feel unsteady or light-headed. You can do this maneuver at home whenever you have symptoms of vertigo. You can do it up to 3 times a day until your symptoms go away. Even though the Epley maneuver may relieve your vertigo for a few weeks, it is possible that your symptoms will return. This maneuver relieves vertigo, but it does not relieve dizziness. What are the risks? If it is done correctly, the Epley maneuver is considered safe. Sometimes it can lead to dizziness or nausea that goes away after a short time. If you develop other symptoms, such as changes in vision, weakness, or numbness, stop doing the maneuver and call your health care provider. How to perform the Epley maneuver 1. Sit on the edge of a bed or table with your back straight and your legs extended or hanging over the edge of the bed or table. 2. Turn your head halfway toward the affected ear or side. 3. Lie backward quickly with your head turned until you are lying flat on your back. You may want to position a pillow under your shoulders. 4. Hold this position for 30 seconds. You may experience an attack of vertigo. This is normal. 5. Turn your head to the opposite direction until your unaffected ear is facing the floor. 6. Hold this position for 30 seconds. You may experience an attack of vertigo. This is normal. Hold this position until the vertigo stops. 7. Turn your whole body to the same side as  your head. Hold for another 30 seconds. 8. Sit back up. You can repeat this exercise up to 3 times a day. Follow these instructions at home:  After doing the Epley maneuver, you can return to your normal activities.  Ask your health care provider if there is anything you should do at home to prevent vertigo. He or she may recommend that you: ? Keep your head raised (elevated) with two or more pillows while you sleep. ? Do not sleep on the side of your affected ear. ? Get up slowly from bed. ? Avoid sudden movements during the day. ? Avoid extreme head movement, like looking up or bending over. Contact a health care provider if:  Your vertigo gets worse.  You have other symptoms, including: ? Nausea. ? Vomiting. ? Headache. Get help right away if:  You have vision changes.  You have a severe or worsening headache or neck pain.  You cannot stop vomiting.  You have new numbness or weakness in any part of your body. Summary  Vertigo is the feeling that you or your surroundings are moving when they are not.  The Epley maneuver is an exercise that relieves symptoms of vertigo.  If the Epley maneuver is done correctly, it is considered safe. You can do it up to 3 times a day. This information is not intended to replace advice given to you by your  health care provider. Make sure you discuss any questions you have with your health care provider. Document Released: 03/04/2013 Document Revised: 01/18/2016 Document Reviewed: 01/18/2016 Elsevier Interactive Patient Education  2017 [employer].

## 2017-12-01 NOTE — Assessment & Plan Note (Addendum)
Vertigo is likely BPPV, no neurological deficit and symptoms worse with head movements Meclizine 12.5 - 25 mg TID prn Can also try xanax that she takes rarely Can try epley maneuver Discussed PT if no improvement Call if no improvement

## 2017-12-09 ENCOUNTER — Other Ambulatory Visit: Payer: Self-pay | Admitting: Internal Medicine

## 2018-03-04 ENCOUNTER — Other Ambulatory Visit: Payer: Self-pay | Admitting: Internal Medicine

## 2018-03-04 NOTE — Telephone Encounter (Signed)
Copied from [place] [phone]. Topic: Quick Communication - Rx Refill/Question >> Mar 04, 2018  2:55 PM Leward Quan A wrote: Medication: amLODipine (NORVASC) 10 MG tablet, hydrochlorothiazide (MICROZIDE) 12.5 MG capsule, atorvastatin (LIPITOR) 40 MG tablet, latanoprost (XALATAN) 0.005 % ophthalmic solution [  Has the patient contacted their pharmacy? Yes.   (Agent: If no, request that the patient contact the pharmacy for the refill.) (Agent: If yes, when and what did the pharmacy advise?)  Preferred Pharmacy (with phone number or street name): [place], [place] [phone] (Phone) [phone] (Fax)    Agent: Please be advised that RX refills may take up to 3 business days. We ask that you follow-up with your pharmacy.

## 2018-03-05 MED ORDER — ATORVASTATIN CALCIUM 40 MG PO TABS: 40.0000 mg | ORAL_TABLET | Freq: Every day | ORAL | 0 refills | Status: DC

## 2018-03-05 MED ORDER — HYDROCHLOROTHIAZIDE 12.5 MG PO CAPS: ORAL_CAPSULE | ORAL | 30 refills | Status: DC

## 2018-03-05 MED ORDER — AMLODIPINE BESYLATE 10 MG PO TABS: 10.0000 mg | ORAL_TABLET | Freq: Every day | ORAL | 0 refills | Status: DC

## 2018-03-05 MED ORDER — LATANOPROST 0.005 % OP SOLN: 1.0000 [drp] | Freq: Every day | OPHTHALMIC | 1 refills | Status: DC

## 2018-03-05 NOTE — Telephone Encounter (Signed)
Requested medication (s) are due for refill today: yes  Requested medication (s) are on the active medication list: yes  Last refill:  ?  Future visit scheduled: no  Notes to clinic: historical provider

## 2018-03-05 NOTE — Telephone Encounter (Signed)
Refill requests for amlodipine, atorvastatin, hctz, and latanoprost; last office visit 07/25/17; no upcoming visits noted; attempted to contact pt; left message on voicemail  [phone]; will grant 30 day courtesy refill per protocol.   Requested Prescriptions  Pending Prescriptions Disp Refills  . amLODipine (NORVASC) 10 MG tablet 30 tablet 0    Sig: Take 1 tablet (10 mg total) by mouth daily.     Cardiovascular:  Calcium Channel Blockers Failed - 03/04/2018  3:12 PM      Failed - Valid encounter within last 6 months    Recent Outpatient Visits          3 months ago Benign paroxysmal positional vertigo, unspecified laterality   [place], MD   7 months ago Essential hypertension   [place], Claudina Lick, MD   10 months ago Fluttering heart   [place], Claudina Lick, MD   1 year ago Medicare annual wellness visit, subsequent   [place], MD   2 years ago [place], Gregory D, FNP             Passed - Last BP in normal range    BP Readings from Last 1 Encounters:  11/30/17 132/80       . hydrochlorothiazide (MICROZIDE) 12.5 MG capsule      Sig: TAKE 1 CAPSULE BY MOUTH EVERY DAY     Cardiovascular: Diuretics - Thiazide Failed - 03/04/2018  3:12 PM      Failed - Valid encounter within last 6 months    Recent Outpatient Visits          3 months ago Benign paroxysmal positional vertigo, unspecified laterality   [place], MD   7 months ago Essential hypertension   [place], Claudina Lick, MD   10 months ago Fluttering heart   [place], Claudina Lick, MD   1 year ago Medicare annual wellness visit, subsequent   [place], MD   2 years ago  [place], Ples Specter, FNP             Passed - Ca in normal range and within 360 days    Calcium  Date Value Ref Range Status  07/25/2017 9.7 8.4 - 10.5 mg/dL Final         Passed - Cr in normal range and within 360 days    Creatinine, Ser  Date Value Ref Range Status  07/25/2017 0.89 0.40 - 1.20 mg/dL Final         Passed - K in normal range and within 360 days    Potassium  Date Value Ref Range Status  07/25/2017 3.5 3.5 - 5.1 mEq/L Final         Passed - Na in normal range and within 360 days    Sodium  Date Value Ref Range Status  07/25/2017 142 135 - 145 mEq/L Final         Passed - Last BP in normal range    BP Readings from Last 1 Encounters:  11/30/17 132/80       . atorvastatin (LIPITOR) 40 MG tablet 30 tablet 0    Sig: Take 1 tablet (40 mg total) by mouth daily.  Cardiovascular:  Antilipid - Statins Failed - 03/04/2018  3:12 PM      Failed - LDL in normal range and within 360 days    LDL Cholesterol  Date Value Ref Range Status  07/25/2017 120 (H) 0 - 99 mg/dL Final         Passed - Total Cholesterol in normal range and within 360 days    Cholesterol  Date Value Ref Range Status  07/25/2017 190 0 - 200 mg/dL Final    Comment:    ATP III Classification       Desirable:  < 200 mg/dL               Borderline High:  200 - 239 mg/dL          High:  > = 240 mg/dL         Passed - HDL in normal range and within 360 days    HDL  Date Value Ref Range Status  07/25/2017 45.80 >39.00 mg/dL Final         Passed - Triglycerides in normal range and within 360 days    Triglycerides  Date Value Ref Range Status  07/25/2017 119.0 0.0 - 149.0 mg/dL Final    Comment:    Normal:  <150 mg/dLBorderline High:  150 - 199 mg/dL         Passed - Patient is not pregnant      Passed - Valid encounter within last 12 months    Recent Outpatient Visits          3 months ago Benign paroxysmal positional vertigo,  unspecified laterality   [place], MD   7 months ago Essential hypertension   [place], MD   10 months ago Fluttering heart   [place], Claudina Lick, MD   1 year ago Medicare annual wellness visit, subsequent   [place], MD   2 years ago [place], Ples Specter, FNP           Refused Prescriptions Disp Refills  . latanoprost (XALATAN) 0.005 % ophthalmic solution 2.5 mL 6    Sig: Place 1 drop into both eyes at bedtime.     Ophthalmology:  Glaucoma Passed - 03/04/2018  3:12 PM      Passed - Valid encounter within last 12 months    Recent Outpatient Visits          3 months ago Benign paroxysmal positional vertigo, unspecified laterality   [place], MD   7 months ago Essential hypertension   [place], MD   10 months ago Fluttering heart   [place], Claudina Lick, MD   1 year ago Medicare annual wellness visit, subsequent   [place], MD   2 years ago [place], FNP

## 2018-03-17 ENCOUNTER — Encounter: Payer: Self-pay | Admitting: Gastroenterology

## 2018-03-27 ENCOUNTER — Ambulatory Visit (INDEPENDENT_AMBULATORY_CARE_PROVIDER_SITE_OTHER): Payer: Medicare PPO

## 2018-03-27 DIAGNOSIS — Z23 Encounter for immunization: Secondary | ICD-10-CM

## 2018-04-05 ENCOUNTER — Other Ambulatory Visit: Payer: Self-pay | Admitting: Internal Medicine

## 2018-04-05 NOTE — Telephone Encounter (Signed)
Please schedule pt an OV. She is due for a follow up.

## 2018-04-05 NOTE — Telephone Encounter (Signed)
Copied from [place]. Topic: Quick Communication - See Telephone Encounter >> Apr 05, 2018  9:39 AM Conception Chancy, NT wrote: CRM for notification. See Telephone encounter for: 04/05/18.  Patient is calling and states Mcarthur Rossetti is going to be taking over her medications and she is needing them all sent in for 90 days so she does not have to pay for them. Please advise.  amLODipine (NORVASC) 10 MG tablet atorvastatin (LIPITOR) 40 MG tablet   [place], [place] [address] Phone: [phone] Fax: [phone]

## 2018-04-05 NOTE — Telephone Encounter (Signed)
Medications sent

## 2018-04-09 NOTE — Patient Instructions (Addendum)

## 2018-04-09 NOTE — Progress Notes (Signed)
Subjective:    Patient ID: Shelley Hayes, female    DOB: [birth year], 73 y.o.   MRN: [id number]  HPI The patient is here for follow up.  Today she started with cold symptoms.  She has a scratchy throat, she is sweating and thinks she may have had a low-grade fever.  She also states body aches and dry cough.  She denies any new fever, nasal congestion, ear pain, sinus pain, shortness of breath and wheezing.  Hypertension: She is taking her medication daily. She is compliant with a low sodium diet.  She denies chest pain, palpitations, edema, shortness of breath and regular headaches. She is exercising irregularly.     Hyperlipidemia: She is taking her medication daily. She is compliant with a low fat/cholesterol diet. She is exercising irregularly. She denies myalgias.   Dysphagia:  She denies reflux and dysphagia.  She is taking omeprazole.    Anxiety:  She rarely takes xanax, but has taken it more recently due to having 4 deaths this month with family/friends.  The medication is effective when she takes it.    Hyperglycemia:  She is compliant with a low sugar/carbohydrate diet.  She is exercising regularly.   Medications and allergies reviewed with patient and updated if appropriate.  Patient Active Problem List   Diagnosis Date Noted  . BPPV (benign paroxysmal positional vertigo) 11/30/2017  . Fatigue 07/26/2017  . Dysphagia 07/26/2017  . Incomplete emptying of bladder 07/25/2017  . Chest pain 04/24/2017  . Hyperglycemia 04/24/2017  . Herpes zoster without complication 40/98/1191  . Blood in stool 04/24/2017  . Anxiety 04/24/2017  . Muscle cramps 06/19/2016  . Cough 06/19/2016  . Osteopenia 04/26/2016  . Fluttering heart 12/23/2015  . Cervical paraspinal muscle spasm 12/23/2015  . SCIATICA, LEFT 01/20/2010  . Hyperlipidemia 08/23/2009  . GLUCOMA 08/23/2009  . Essential hypertension 08/23/2009  . Allergic rhinitis 08/23/2009  . ARTHRITIS 08/23/2009  . KNEE PAIN  08/23/2009    Current Outpatient Medications on File Prior to Visit  Medication Sig Dispense Refill  . albuterol (PROVENTIL HFA;VENTOLIN HFA) 108 (90 Base) MCG/ACT inhaler Inhale 2 puffs into the lungs every 6 (six) hours as needed for wheezing or shortness of breath. 1 Inhaler 5  . ALPRAZolam (XANAX) 0.5 MG tablet Take 1 tablet (0.5 mg total) by mouth 2 (two) times daily as needed for anxiety or sleep. 30 tablet 0  . amLODipine (NORVASC) 10 MG tablet Take 1 tablet daily. Need office visit for more refills 90 tablet 0  . aspirin 81 MG tablet Take 81 mg by mouth daily.      Marland Kitchen atorvastatin (LIPITOR) 40 MG tablet Take 1 tablet daily. Need office visit for more fills. 30 tablet 0  . Cholecalciferol (VITAMIN D3) 1000 UNITS CAPS Take 1 capsule (1,000 Units total) by mouth daily. 30 capsule   . hydrochlorothiazide (MICROZIDE) 12.5 MG capsule TAKE 1 CAPSULE BY MOUTH EVERY DAY 30 capsule 30  . latanoprost (XALATAN) 0.005 % ophthalmic solution Place 1 drop into both eyes at bedtime. 2.5 mL 1  . meclizine (ANTIVERT) 12.5 MG tablet Take 1-2 tablets (12.5-25 mg total) by mouth 3 (three) times daily as needed for dizziness. 30 tablet 0  . Multiple Vitamin (MULTIVITAMIN) tablet Take 1 tablet by mouth daily.      Marland Kitchen omeprazole (PRILOSEC) 40 MG capsule Take 1 capsule (40 mg total) by mouth daily. 30 capsule 11  . vitamin B-12 (CYANOCOBALAMIN) 1000 MCG tablet Take 1 tablet (1,000 mcg total) by  mouth daily.     No current facility-administered medications on file prior to visit.     Past Medical History:  Diagnosis Date  . ALLERGIC RHINITIS   . Allergic rhinitis 08/23/2009  . Allergy   . Anxiety   . ARTHRITIS   . Arthritis   . Blood in stool 04/24/2017  . Cataract    bil cataracts removes  . Cervical paraspinal muscle spasm 12/23/2015  . Chest pain 04/24/2017  . CHEST PAIN 12/19/2009   Qualifier: Diagnosis of  By: Marca Ancona RMA, Lucy    . COLONIC POLYPS, HX OF 08/23/2009   Qualifier: Diagnosis of  By: Marca Ancona  RMA, Lucy    . Cough 06/19/2016  . Fluttering heart 12/23/2015  . GERD (gastroesophageal reflux disease)   . Glaucoma   . GLUCOMA   . Heart murmur   . Herpes zoster without complication 07/30/8020  . Hyperglycemia 06/19/2016  . HYPERLIPIDEMIA   . HYPERTENSION   . KNEE PAIN 08/23/2009   Qualifier: Diagnosis of  By: Asa Lente MD, Jannifer Rodney Muscle cramps 06/19/2016  . Osteopenia 04/26/2016   2/18 - dexa - RFN and LFN  -1.2  . OTITIS EXTERNA, RIGHT 01/20/2010   Qualifier: Diagnosis of  By: Asa Lente MD, Jannifer Rodney Prediabetes 04/24/2017  . SCIATICA, LEFT 11/2010    Past Surgical History:  Procedure Laterality Date  . BREAST BIOPSY Right    benign  . COLONOSCOPY    . ESOPHAGOGASTRODUODENOSCOPY    . TUBAL LIGATION      Social History   Socioeconomic History  . Marital status: Divorced    Spouse name: Not on file  . Number of children: 2  . Years of education: Not on file  . Highest education level: Not on file  Occupational History  . Occupation: Retired  Scientific laboratory technician  . Financial resource strain: Not on file  . Food insecurity:    Worry: Never true    Inability: Never true  . Transportation needs:    Medical: Yes    Non-medical: Yes  Tobacco Use  . Smoking status: Never Smoker  . Smokeless tobacco: Never Used  Substance and Sexual Activity  . Alcohol use: No    Alcohol/week: 0.0 standard drinks  . Drug use: No  . Sexual activity: Not Currently  Lifestyle  . Physical activity:    Days per week: 3 days    Minutes per session: 30 min  . Stress: Only a little  Relationships  . Social connections:    Talks on phone: More than three times a week    Gets together: More than three times a week    Attends religious service: More than 4 times per year    Active member of club or organization: Yes    Attends meetings of clubs or organizations: More than 4 times per year    Relationship status: Not on file  Other Topics Concern  . Not on file  Social History Narrative    Divorced, lives alone- in [place] for [place]yrs but moved "home" to [place] in 2008      Walking regularly, every morning    Family History  Problem Relation Age of Onset  . Arthritis Mother   . Diabetes Mother   . Colon cancer Mother 48  . Arthritis Father   . Alcohol abuse Brother   . Lung cancer Brother   . Prostate cancer Brother   . Diabetes Brother   . Hypertension Brother   .  Lung cancer Other        Neice  . Esophageal cancer Neg Hx   . Liver cancer Neg Hx   . Pancreatic cancer Neg Hx   . Rectal cancer Neg Hx   . Stomach cancer Neg Hx     Review of Systems  Constitutional: Positive for diaphoresis (last night). Negative for chills and fever.  HENT: Positive for sore throat. Negative for congestion, ear pain, postnasal drip and sinus pain.   Respiratory: Positive for cough. Negative for shortness of breath and wheezing.   Cardiovascular: Negative for chest pain, palpitations and leg swelling.  Gastrointestinal: Negative for diarrhea and nausea.  Musculoskeletal: Positive for myalgias.  Neurological: Negative for dizziness, light-headedness and headaches.       Objective:   Vitals:   04/10/18 0833  BP: (!) 142/88  Pulse: 76  Resp: 16  Temp: 99.5 F (37.5 C)  SpO2: 94%   BP Readings from Last 3 Encounters:  04/10/18 (!) 142/88  11/30/17 132/80  09/10/17 112/76   Wt Readings from Last 3 Encounters:  04/10/18 214 lb 1.9 oz (97.1 kg)  11/30/17 209 lb (94.8 kg)  09/10/17 201 lb (91.2 kg)   Body mass index is 46.34 kg/m.   Physical Exam    Constitutional: Appears well-developed and well-nourished. No distress.  HENT:  Head: Normocephalic and atraumatic.  Neck: Neck supple. No tracheal deviation present. No thyromegaly present.  No cervical lymphadenopathy Cardiovascular: Normal rate, regular rhythm and normal heart sounds.   No murmur heard. No carotid bruit .  No edema Pulmonary/Chest: Effort normal and breath sounds normal. No respiratory distress. No  has no wheezes. No rales.  Skin: Skin is warm and dry. Not diaphoretic.  Psychiatric: Normal mood and affect. Behavior is normal.      Assessment & Plan:    See Problem List for Assessment and Plan of chronic medical problems.

## 2018-04-10 ENCOUNTER — Other Ambulatory Visit (INDEPENDENT_AMBULATORY_CARE_PROVIDER_SITE_OTHER): Payer: Medicare PPO

## 2018-04-10 ENCOUNTER — Ambulatory Visit (INDEPENDENT_AMBULATORY_CARE_PROVIDER_SITE_OTHER): Payer: Medicare PPO | Admitting: Internal Medicine

## 2018-04-10 ENCOUNTER — Encounter: Payer: Self-pay | Admitting: Internal Medicine

## 2018-04-10 VITALS — BP 142/88 | HR 76 | Temp 99.5°F | Resp 16 | Ht <= 58 in | Wt 214.1 lb

## 2018-04-10 DIAGNOSIS — R1310 Dysphagia, unspecified: Secondary | ICD-10-CM | POA: Diagnosis not present

## 2018-04-10 DIAGNOSIS — F419 Anxiety disorder, unspecified: Secondary | ICD-10-CM

## 2018-04-10 DIAGNOSIS — J069 Acute upper respiratory infection, unspecified: Secondary | ICD-10-CM | POA: Diagnosis not present

## 2018-04-10 DIAGNOSIS — E782 Mixed hyperlipidemia: Secondary | ICD-10-CM | POA: Diagnosis not present

## 2018-04-10 DIAGNOSIS — R739 Hyperglycemia, unspecified: Secondary | ICD-10-CM | POA: Diagnosis not present

## 2018-04-10 DIAGNOSIS — I10 Essential (primary) hypertension: Secondary | ICD-10-CM | POA: Diagnosis not present

## 2018-04-10 LAB — COMPREHENSIVE METABOLIC PANEL
ALT: 17 U/L (ref 0–35)
AST: 19 U/L (ref 0–37)
Albumin: 4.4 g/dL (ref 3.5–5.2)
Alkaline Phosphatase: 89 U/L (ref 39–117)
BUN: 20 mg/dL (ref 6–23)
CO2: 27 mEq/L (ref 19–32)
CREATININE: 0.86 mg/dL (ref 0.40–1.20)
Calcium: 9.7 mg/dL (ref 8.4–10.5)
Chloride: 103 mEq/L (ref 96–112)
GFR: 78.36 mL/min (ref >60.00)
Glucose, Bld: 98 mg/dL (ref 70–99)
Potassium: 3.9 mEq/L (ref 3.5–5.1)
Sodium: 142 mEq/L (ref 135–145)
Total Bilirubin: 0.4 mg/dL (ref 0.2–1.2)
Total Protein: 7.9 g/dL (ref 6.0–8.3)

## 2018-04-10 LAB — LIPID PANEL
Cholesterol: 187 mg/dL (ref 0–200)
HDL: 43.4 mg/dL (ref >39.00)
LDL Cholesterol: 129 mg/dL — ABNORMAL HIGH (ref 0–99)
NonHDL: 143.55
Total CHOL/HDL Ratio: 4
Triglycerides: 73 mg/dL (ref 0.0–149.0)
VLDL: 14.6 mg/dL (ref 0.0–40.0)

## 2018-04-10 LAB — CBC WITH DIFFERENTIAL/PLATELET
Basophils Absolute: 0 10*3/uL (ref 0.0–0.1)
Basophils Relative: 0.6 % (ref 0.0–3.0)
Eosinophils Absolute: 0.1 10*3/uL (ref 0.0–0.7)
Eosinophils Relative: 1.3 % (ref 0.0–5.0)
HCT: 42 % (ref 36.0–46.0)
Hemoglobin: 13.7 g/dL (ref 12.0–15.0)
LYMPHS ABS: 1.5 10*3/uL (ref 0.7–4.0)
Lymphocytes Relative: 19 % (ref 12.0–46.0)
MCHC: 32.6 g/dL (ref 30.0–36.0)
MCV: 85.3 fl (ref 78.0–100.0)
Monocytes Absolute: 0.6 10*3/uL (ref 0.1–1.0)
Monocytes Relative: 8.2 % (ref 3.0–12.0)
Neutro Abs: 5.6 10*3/uL (ref 1.4–7.7)
Neutrophils Relative %: 70.9 % (ref 43.0–77.0)
Platelets: 340 10*3/uL (ref 150.0–400.0)
RBC: 4.92 Mil/uL (ref 3.87–5.11)
RDW: 15 % (ref 11.5–15.5)
WBC: 7.9 10*3/uL (ref 4.0–10.5)

## 2018-04-10 LAB — TSH: TSH: 0.96 u[IU]/mL (ref 0.35–4.50)

## 2018-04-10 LAB — HEMOGLOBIN A1C: Hgb A1c MFr Bld: 5.5 % (ref 4.6–6.5)

## 2018-04-10 MED ORDER — ALPRAZOLAM 0.5 MG PO TABS: 0.5000 mg | ORAL_TABLET | Freq: Two times a day (BID) | ORAL | 0 refills | Status: DC | PRN

## 2018-04-10 MED ORDER — HYDROCHLOROTHIAZIDE 12.5 MG PO CAPS: ORAL_CAPSULE | ORAL | 1 refills | Status: DC

## 2018-04-10 NOTE — Assessment & Plan Note (Signed)
Denies GERD or dysphasia Continue omeprazole daily

## 2018-04-10 NOTE — Assessment & Plan Note (Signed)
Taking Xanax only as needed and tries not to take very often Okay to continue-effective and well-tolerated without side effects We will refill today

## 2018-04-10 NOTE — Assessment & Plan Note (Signed)
Check lipid panel  Continue daily statin Regular exercise and healthy diet encouraged  

## 2018-04-10 NOTE — Assessment & Plan Note (Signed)
Check a1c Low sugar / carb diet Stressed regular exercise   

## 2018-04-10 NOTE — Assessment & Plan Note (Signed)
Blood pressure slightly elevated here today, but typically better controlled Continue current medication and current dose CMP

## 2018-04-10 NOTE — Assessment & Plan Note (Signed)
Started today with some cold symptoms At this point advised her that it is too early to tell if this is viral or bacterial At this point treat symptomatically Call her and may need to return if symptoms worsen to be further evaluated

## 2018-05-04 ENCOUNTER — Other Ambulatory Visit: Payer: Self-pay | Admitting: Internal Medicine

## 2018-06-10 ENCOUNTER — Other Ambulatory Visit: Payer: Self-pay | Admitting: Internal Medicine

## 2018-07-15 DIAGNOSIS — H401132 Primary open-angle glaucoma, bilateral, moderate stage: Secondary | ICD-10-CM | POA: Diagnosis not present

## 2018-07-15 DIAGNOSIS — H43811 Vitreous degeneration, right eye: Secondary | ICD-10-CM | POA: Diagnosis not present

## 2018-08-02 DIAGNOSIS — I10 Essential (primary) hypertension: Secondary | ICD-10-CM | POA: Diagnosis not present

## 2018-08-02 DIAGNOSIS — E785 Hyperlipidemia, unspecified: Secondary | ICD-10-CM | POA: Diagnosis not present

## 2018-08-02 DIAGNOSIS — H409 Unspecified glaucoma: Secondary | ICD-10-CM | POA: Diagnosis not present

## 2018-08-02 DIAGNOSIS — Z9841 Cataract extraction status, right eye: Secondary | ICD-10-CM | POA: Diagnosis not present

## 2018-08-02 DIAGNOSIS — Z9842 Cataract extraction status, left eye: Secondary | ICD-10-CM | POA: Diagnosis not present

## 2018-08-02 DIAGNOSIS — M545 Low back pain: Secondary | ICD-10-CM | POA: Diagnosis not present

## 2018-08-02 DIAGNOSIS — M5432 Sciatica, left side: Secondary | ICD-10-CM | POA: Diagnosis not present

## 2018-08-02 DIAGNOSIS — M5431 Sciatica, right side: Secondary | ICD-10-CM | POA: Diagnosis not present

## 2018-08-03 ENCOUNTER — Other Ambulatory Visit: Payer: Self-pay | Admitting: Internal Medicine

## 2018-09-09 ENCOUNTER — Other Ambulatory Visit: Payer: Self-pay | Admitting: Internal Medicine

## 2018-10-10 ENCOUNTER — Ambulatory Visit: Payer: Medicare PPO

## 2018-10-10 ENCOUNTER — Encounter: Payer: Medicare PPO | Admitting: Internal Medicine

## 2018-10-18 ENCOUNTER — Other Ambulatory Visit: Payer: Self-pay | Admitting: Internal Medicine

## 2018-11-28 DIAGNOSIS — H018 Other specified inflammations of eyelid: Secondary | ICD-10-CM | POA: Diagnosis not present

## 2018-11-28 DIAGNOSIS — H1859 Other hereditary corneal dystrophies: Secondary | ICD-10-CM | POA: Diagnosis not present

## 2018-12-11 ENCOUNTER — Other Ambulatory Visit: Payer: Self-pay | Admitting: Internal Medicine

## 2018-12-11 MED ORDER — HYDROCHLOROTHIAZIDE 12.5 MG PO CAPS: ORAL_CAPSULE | ORAL | 0 refills | Status: DC

## 2018-12-11 MED ORDER — AMLODIPINE BESYLATE 10 MG PO TABS: ORAL_TABLET | ORAL | 0 refills | Status: DC

## 2018-12-11 MED ORDER — ATORVASTATIN CALCIUM 40 MG PO TABS: 40.0000 mg | ORAL_TABLET | Freq: Every day | ORAL | 0 refills | Status: DC

## 2018-12-11 NOTE — Telephone Encounter (Signed)
Medication Refill - Medication:  amLODipine (NORVASC) 10 MG tablet  atorvastatin (LIPITOR) 40 MG tablet hydrochlorothiazide (MICROZIDE) 12.5 MG capsule  Has the patient contacted their pharmacy? Yes advised to call office. Patient is wanting a refill as she is completely out of medications and does have future appointment for refill.   Preferred Pharmacy (with phone number or street name):  [facility], [place] [phone] (Phone) [phone] (Fax)   Agent: Please be advised that RX refills may take up to 3 business days. We ask that you follow-up with your pharmacy.

## 2018-12-11 NOTE — Telephone Encounter (Signed)
Requested medication (s) are due for refill today: yes  Requested medication (s) are on the active medication list: yes  Last refill:  09/09/2018  Future visit scheduled: yes  Notes to clinic:  Review for refill   Requested Prescriptions  Pending Prescriptions Disp Refills   atorvastatin (LIPITOR) 40 MG tablet 90 tablet 0    Sig: Take 1 tablet (40 mg total) by mouth daily.     Cardiovascular:  Antilipid - Statins Failed - 12/11/2018 12:18 PM      Failed - LDL in normal range and within 360 days    LDL Cholesterol  Date Value Ref Range Status  04/10/2018 129 (H) 0 - 99 mg/dL Final         Passed - Total Cholesterol in normal range and within 360 days    Cholesterol  Date Value Ref Range Status  04/10/2018 187 0 - 200 mg/dL Final    Comment:    ATP III Classification       Desirable:  < 200 mg/dL               Borderline High:  200 - 239 mg/dL          High:  > = 240 mg/dL         Passed - HDL in normal range and within 360 days    HDL  Date Value Ref Range Status  04/10/2018 43.40 >39.00 mg/dL Final         Passed - Triglycerides in normal range and within 360 days    Triglycerides  Date Value Ref Range Status  04/10/2018 73.0 0.0 - 149.0 mg/dL Final    Comment:    Normal:  <150 mg/dLBorderline High:  150 - 199 mg/dL         Passed - Patient is not pregnant      Passed - Valid encounter within last 12 months    Recent Outpatient Visits          8 months ago Essential hypertension   [place], Claudina Lick, MD   1 year ago Benign paroxysmal positional vertigo, unspecified laterality   [place], Stacy J, MD   1 year ago Essential hypertension   [place], Claudina Lick, MD   1 year ago Fluttering heart   [place], Claudina Lick, MD   2 years ago Medicare annual wellness visit, subsequent   [place],  MD      Future Appointments            In 5 days [place], Claudina Lick, MD [place], [facility]            hydrochlorothiazide (MICROZIDE) 12.5 MG capsule 90 capsule 1    Sig: TAKE 1 CAPSULE BY MOUTH EVERY DAY     Cardiovascular: Diuretics - Thiazide Failed - 12/11/2018 12:18 PM      Failed - Last BP in normal range    BP Readings from Last 1 Encounters:  04/10/18 (!) 142/88         Failed - Valid encounter within last 6 months    Recent Outpatient Visits          8 months ago Essential hypertension   [place], MD   1 year ago Benign paroxysmal positional vertigo, unspecified laterality   [employer] technical officer  [facility] [place], Claudina Lick, MD   1 year ago Essential hypertension   [place], Claudina Lick, MD   1 year ago Fluttering heart   [place], Stacy J, MD   2 years ago Medicare annual wellness visit, subsequent   [place], MD      Future Appointments            In 5 days [place], Claudina Lick, MD [place], [place] in normal range and within 360 days    Calcium  Date Value Ref Range Status  04/10/2018 9.7 8.4 - 10.5 mg/dL Final         Passed - Cr in normal range and within 360 days    Creatinine, Ser  Date Value Ref Range Status  04/10/2018 0.86 0.40 - 1.20 mg/dL Final         Passed - K in normal range and within 360 days    Potassium  Date Value Ref Range Status  04/10/2018 3.9 3.5 - 5.1 mEq/L Final         Passed - Na in normal range and within 360 days    Sodium  Date Value Ref Range Status  04/10/2018 142 135 - 145 mEq/L Final          amLODipine (NORVASC) 10 MG tablet 90 tablet 1    Sig: TAKE 1 TABLET EVERY DAY     Cardiovascular:  Calcium Channel Blockers Failed - 12/11/2018 12:18 PM      Failed - Last BP in normal range    BP  Readings from Last 1 Encounters:  04/10/18 (!) 142/88         Failed - Valid encounter within last 6 months    Recent Outpatient Visits          8 months ago Essential hypertension   [place], Claudina Lick, MD   1 year ago Benign paroxysmal positional vertigo, unspecified laterality   [place], Claudina Lick, MD   1 year ago Essential hypertension   [place], Claudina Lick, MD   1 year ago Fluttering heart   [place], Stacy J, MD   2 years ago Medicare annual wellness visit, subsequent   [place], MD      Future Appointments            In 5 days [place], Claudina Lick, MD [place], [facility]

## 2018-12-15 NOTE — Patient Instructions (Addendum)
Tests ordered today. Your results will be released to [facility] (or called to you) after review.  If any changes need to be made, you will be notified at that same time.  All other Health Maintenance issues reviewed.   All recommended immunizations and age-appropriate screenings are up-to-date or discussed.  Flu immunization administered today.    Medications reviewed and updated.  Changes include :  none   Your prescription(s) have been submitted to your pharmacy. Please take as directed and contact our office if you believe you are having problem(s) with the medication(s).   Please followup in 6 months     Health Maintenance, Female Adopting a healthy lifestyle and getting preventive care are important in promoting health and wellness. Ask your health care provider about:  The right schedule for you to have regular tests and exams.  Things you can do on your own to prevent diseases and keep yourself healthy. What should I know about diet, weight, and exercise? Eat a healthy diet   Eat a diet that includes plenty of vegetables, fruits, low-fat dairy products, and lean protein.  Do not eat a lot of foods that are high in solid fats, added sugars, or sodium. Maintain a healthy weight Body mass index (BMI) is used to identify weight problems. It estimates body fat based on height and weight. Your health care provider can help determine your BMI and help you achieve or maintain a healthy weight. Get regular exercise Get regular exercise. This is one of the most important things you can do for your health. Most adults should:  Exercise for at least 150 minutes each week. The exercise should increase your heart rate and make you sweat (moderate-intensity exercise).  Do strengthening exercises at least twice a week. This is in addition to the moderate-intensity exercise.  Spend less time sitting. Even light physical activity can be beneficial. Watch cholesterol and blood lipids Have  your blood tested for lipids and cholesterol at 73 years of age, then have this test every 5 years. Have your cholesterol levels checked more often if:  Your lipid or cholesterol levels are high.  You are older than 73 years of age.  You are at high risk for heart disease. What should I know about cancer screening? Depending on your health history and family history, you may need to have cancer screening at various ages. This may include screening for:  Breast cancer.  Cervical cancer.  Colorectal cancer.  Skin cancer.  Lung cancer. What should I know about heart disease, diabetes, and high blood pressure? Blood pressure and heart disease  High blood pressure causes heart disease and increases the risk of stroke. This is more likely to develop in people who have high blood pressure readings, are of African descent, or are overweight.  Have your blood pressure checked: ? Every 3-5 years if you are 29-71 years of age. ? Every year if you are 66 years old or older. Diabetes Have regular diabetes screenings. This checks your fasting blood sugar level. Have the screening done:  Once every three years after age 31 if you are at a normal weight and have a low risk for diabetes.  More often and at a younger age if you are overweight or have a high risk for diabetes. What should I know about preventing infection? Hepatitis B If you have a higher risk for hepatitis B, you should be screened for this virus. Talk with your health care provider to find out if you are  at risk for hepatitis B infection. Hepatitis C Testing is recommended for:  Everyone born from 1945 through 1965.  Anyone with known risk factors for hepatitis C. Sexually transmitted infections (STIs)  Get screened for STIs, including gonorrhea and chlamydia, if: ? You are sexually active and are younger than 73 years of age. ? You are older than 73 years of age and your health care provider tells you that you are at  risk for this type of infection. ? Your sexual activity has changed since you were last screened, and you are at increased risk for chlamydia or gonorrhea. Ask your health care provider if you are at risk.  Ask your health care provider about whether you are at high risk for HIV. Your health care provider may recommend a prescription medicine to help prevent HIV infection. If you choose to take medicine to prevent HIV, you should first get tested for HIV. You should then be tested every 3 months for as long as you are taking the medicine. Pregnancy  If you are about to stop having your period (premenopausal) and you may become pregnant, seek counseling before you get pregnant.  Take 400 to 800 micrograms (mcg) of folic acid every day if you become pregnant.  Ask for birth control (contraception) if you want to prevent pregnancy. Osteoporosis and menopause Osteoporosis is a disease in which the bones lose minerals and strength with aging. This can result in bone fractures. If you are 65 years old or older, or if you are at risk for osteoporosis and fractures, ask your health care provider if you should:  Be screened for bone loss.  Take a calcium or vitamin D supplement to lower your risk of fractures.  Be given hormone replacement therapy (HRT) to treat symptoms of menopause. Follow these instructions at home: Lifestyle  Do not use any products that contain nicotine or tobacco, such as cigarettes, e-cigarettes, and chewing tobacco. If you need help quitting, ask your health care provider.  Do not use street drugs.  Do not share needles.  Ask your health care provider for help if you need support or information about quitting drugs. Alcohol use  Do not drink alcohol if: ? Your health care provider tells you not to drink. ? You are pregnant, may be pregnant, or are planning to become pregnant.  If you drink alcohol: ? Limit how much you use to 0-1 drink a day. ? Limit intake if you  are breastfeeding.  Be aware of how much alcohol is in your drink. In the [place] one drink equals one 12 oz bottle of beer (355 mL), one 5 oz glass of wine (148 mL), or one 1 oz glass of hard liquor (44 mL). General instructions  Schedule regular health, dental, and eye exams.  Stay current with your vaccines.  Tell your health care provider if: ? You often feel depressed. ? You have ever been abused or do not feel safe at home. Summary  Adopting a healthy lifestyle and getting preventive care are important in promoting health and wellness.  Follow your health care provider's instructions about healthy diet, exercising, and getting tested or screened for diseases.  Follow your health care provider's instructions on monitoring your cholesterol and blood pressure. This information is not intended to replace advice given to you by your health care provider. Make sure you discuss any questions you have with your health care provider. Document Released: 09/12/2010 Document Revised: 02/20/2018 Document Reviewed: 02/20/2018 Elsevier Patient Education  2020   [employer].

## 2018-12-15 NOTE — Progress Notes (Signed)
Subjective:    Patient ID: Shelley Hayes, female    DOB: 1945-06-25, 73 y.o.   MRN: [id number]  HPI She is here for a physical exam.   If she is standing and preparing food she gets weak and her legs feel like they get numb.  Her whole body feels weak.  This started about a few months ago.  She denies lightheaded at the time.  She denies other symptoms.  She thinks these symptom may have gotten slightly better since going back to the gym just recently.  She can walk 2 miles w/o difficulty.   She was without her BP for a while - she started Sunday and took it this morning.  She takes her BP and it always less than 140.    Medications and allergies reviewed with patient and updated if appropriate.  Patient Active Problem List   Diagnosis Date Noted  . BPPV (benign paroxysmal positional vertigo) 11/30/2017  . Dysphagia 07/26/2017  . Incomplete emptying of bladder 07/25/2017  . Chest pain 04/24/2017  . Hyperglycemia 04/24/2017  . Herpes zoster without complication 0000000  . Anxiety 04/24/2017  . Muscle cramps 06/19/2016  . Osteopenia 04/26/2016  . Fluttering heart 12/23/2015  . Cervical paraspinal muscle spasm 12/23/2015  . SCIATICA, LEFT 01/20/2010  . Hyperlipidemia 08/23/2009  . GLUCOMA 08/23/2009  . Essential hypertension 08/23/2009  . Allergic rhinitis 08/23/2009  . ARTHRITIS 08/23/2009  . KNEE PAIN 08/23/2009    Current Outpatient Medications on File Prior to Visit  Medication Sig Dispense Refill  . ALPRAZolam (XANAX) 0.5 MG tablet Take 1 tablet (0.5 mg total) by mouth 2 (two) times daily as needed for anxiety or sleep. 30 tablet 0  . amLODipine (NORVASC) 10 MG tablet TAKE 1 TABLET EVERY DAY 90 tablet 0  . aspirin 81 MG tablet Take 81 mg by mouth daily.      Marland Kitchen atorvastatin (LIPITOR) 40 MG tablet Take 1 tablet (40 mg total) by mouth daily. 90 tablet 0  . Cholecalciferol (VITAMIN D3) 1000 UNITS CAPS Take 1 capsule (1,000 Units total) by mouth daily. 30 capsule   .  hydrochlorothiazide (MICROZIDE) 12.5 MG capsule TAKE 1 CAPSULE BY MOUTH EVERY DAY 90 capsule 0  . latanoprost (XALATAN) 0.005 % ophthalmic solution PLACE 1 DROP INTO BOTH EYES AT BEDTIME. 2.5 mL 0  . Multiple Vitamin (MULTIVITAMIN) tablet Take 1 tablet by mouth daily.      Marland Kitchen omeprazole (PRILOSEC) 40 MG capsule Take 1 capsule (40 mg total) by mouth daily. 30 capsule 11  . vitamin B-12 (CYANOCOBALAMIN) 1000 MCG tablet Take 1 tablet (1,000 mcg total) by mouth daily.     No current facility-administered medications on file prior to visit.     Past Medical History:  Diagnosis Date  . ALLERGIC RHINITIS   . Allergic rhinitis 08/23/2009  . Allergy   . Anxiety   . ARTHRITIS   . Arthritis   . Blood in stool 04/24/2017  . Cataract    bil cataracts removes  . Cervical paraspinal muscle spasm 12/23/2015  . Chest pain 04/24/2017  . CHEST PAIN 12/19/2009   Qualifier: Diagnosis of  By: Marca Ancona RMA, Lucy    . COLONIC POLYPS, HX OF 08/23/2009   Qualifier: Diagnosis of  By: Marca Ancona RMA, Lucy    . Cough 06/19/2016  . Fluttering heart 12/23/2015  . GERD (gastroesophageal reflux disease)   . Glaucoma   . GLUCOMA   . Heart murmur   . Herpes zoster without complication 99991111  .  Hyperglycemia 06/19/2016  . HYPERLIPIDEMIA   . HYPERTENSION   . KNEE PAIN 08/23/2009   Qualifier: Diagnosis of  By: Asa Lente MD, Jannifer Rodney Muscle cramps 06/19/2016  . Osteopenia 04/26/2016   2/18 - dexa - RFN and LFN  -1.2  . OTITIS EXTERNA, RIGHT 01/20/2010   Qualifier: Diagnosis of  By: Asa Lente MD, Jannifer Rodney Prediabetes 04/24/2017  . SCIATICA, LEFT 11/2010    Past Surgical History:  Procedure Laterality Date  . BREAST BIOPSY Right    benign  . COLONOSCOPY    . ESOPHAGOGASTRODUODENOSCOPY    . TUBAL LIGATION      Social History   Socioeconomic History  . Marital status: Divorced    Spouse name: Not on file  . Number of children: 2  . Years of education: Not on file  . Highest education level: Not on file   Occupational History  . Occupation: Retired  Scientific laboratory technician  . Financial resource strain: Not on file  . Food insecurity    Worry: Never true    Inability: Never true  . Transportation needs    Medical: Yes    Non-medical: Yes  Tobacco Use  . Smoking status: Never Smoker  . Smokeless tobacco: Never Used  Substance and Sexual Activity  . Alcohol use: No    Alcohol/week: 0.0 standard drinks  . Drug use: No  . Sexual activity: Not Currently  Lifestyle  . Physical activity    Days per week: 3 days    Minutes per session: 30 min  . Stress: Only a little  Relationships  . Social connections    Talks on phone: More than three times a week    Gets together: More than three times a week    Attends religious service: More than 4 times per year    Active member of club or organization: Yes    Attends meetings of clubs or organizations: More than 4 times per year    Relationship status: Not on file  Other Topics Concern  . Not on file  Social History Narrative   Divorced, lives alone- in [place] for 33yrs but moved "home" to [place] in 2008      Walking regularly, every morning    Family History  Problem Relation Age of Onset  . Arthritis Mother   . Diabetes Mother   . Colon cancer Mother 65  . Arthritis Father   . Alcohol abuse Brother   . Lung cancer Brother   . Prostate cancer Brother   . Diabetes Brother   . Hypertension Brother   . Lung cancer Other        Neice  . Esophageal cancer Neg Hx   . Liver cancer Neg Hx   . Pancreatic cancer Neg Hx   . Rectal cancer Neg Hx   . Stomach cancer Neg Hx     Review of Systems  Constitutional: Negative for chills and fever.  HENT: Negative for trouble swallowing.   Eyes: Negative for visual disturbance.  Respiratory: Negative for cough, shortness of breath and wheezing.   Cardiovascular: Positive for leg swelling (sometimes left ankle). Negative for chest pain and palpitations.  Gastrointestinal: Positive for abdominal pain  (RLQ pain, intermittent x few weeks). Negative for blood in stool (no black stool), constipation, diarrhea and nausea.       GERD controlled  Genitourinary: Negative for dysuria and hematuria.  Musculoskeletal: Positive for arthralgias.  Skin: Negative for color change and rash.  Neurological:  Positive for dizziness (when she stands after bending). Negative for light-headedness, numbness and headaches.  Psychiatric/Behavioral: Negative for dysphoric mood. The patient is not nervous/anxious.        Objective:   Vitals:   12/16/18 1038  BP: (!) 148/82  Pulse: 97  Resp: 18  Temp: 98.6 F (37 C)  SpO2: 98%   Filed Weights   12/16/18 1038  Weight: 217 lb (98.4 kg)   Body mass index is 46.96 kg/m.  BP Readings from Last 3 Encounters:  12/16/18 (!) 148/82  04/10/18 (!) 142/88  11/30/17 132/80    Wt Readings from Last 3 Encounters:  12/16/18 217 lb (98.4 kg)  04/10/18 214 lb 1.9 oz (97.1 kg)  11/30/17 209 lb (94.8 kg)     Physical Exam Constitutional: She appears well-developed and well-nourished. No distress.  HENT:  Head: Normocephalic and atraumatic.  Right Ear: External ear normal. Normal ear canal and TM Left Ear: External ear normal.  Normal ear canal and TM Mouth/Throat: Oropharynx is clear and moist.  Eyes: Conjunctivae and EOM are normal.  Neck: Neck supple. No tracheal deviation present. No thyromegaly present.  No carotid bruit  Cardiovascular: Normal rate, regular rhythm and normal heart sounds.   No murmur heard.  No edema. Pulmonary/Chest: Effort normal and breath sounds normal. No respiratory distress. She has no wheezes. She has no rales.  Breast: deferred   Abdominal: Soft. She exhibits no distension. There is no tenderness.  Lymphadenopathy: She has no cervical adenopathy.  Skin: Skin is warm and dry. She is not diaphoretic.  Psychiatric: She has a normal mood and affect. Her behavior is normal.        Assessment & Plan:   Physical exam:  Screening blood work    ordered Immunizations  Flu vaccine today, shingrix discussed Colonoscopy  Up to date  Mammogram  Mammogram schedule this month Gyn  Up to date - Dr Gertie Fey Dexa  Up to date  Eye exams  Up to date  Exercise  Going to [facility]  Stressed weight loss - continue regular exercise and dec portions Substance abuse    none  See Problem List for Assessment and Plan of chronic medical problems.   FU in 6 months

## 2018-12-16 ENCOUNTER — Other Ambulatory Visit (INDEPENDENT_AMBULATORY_CARE_PROVIDER_SITE_OTHER): Payer: Medicare PPO

## 2018-12-16 ENCOUNTER — Encounter: Payer: Self-pay | Admitting: Internal Medicine

## 2018-12-16 ENCOUNTER — Ambulatory Visit (INDEPENDENT_AMBULATORY_CARE_PROVIDER_SITE_OTHER): Payer: Medicare PPO | Admitting: Internal Medicine

## 2018-12-16 ENCOUNTER — Other Ambulatory Visit: Payer: Self-pay

## 2018-12-16 VITALS — BP 148/82 | HR 97 | Temp 98.6°F | Resp 18 | Ht <= 58 in | Wt 217.0 lb

## 2018-12-16 DIAGNOSIS — I10 Essential (primary) hypertension: Secondary | ICD-10-CM

## 2018-12-16 DIAGNOSIS — E782 Mixed hyperlipidemia: Secondary | ICD-10-CM

## 2018-12-16 DIAGNOSIS — F419 Anxiety disorder, unspecified: Secondary | ICD-10-CM

## 2018-12-16 DIAGNOSIS — Z0000 Encounter for general adult medical examination without abnormal findings: Secondary | ICD-10-CM

## 2018-12-16 DIAGNOSIS — R1310 Dysphagia, unspecified: Secondary | ICD-10-CM

## 2018-12-16 DIAGNOSIS — M85861 Other specified disorders of bone density and structure, right lower leg: Secondary | ICD-10-CM | POA: Diagnosis not present

## 2018-12-16 DIAGNOSIS — M85862 Other specified disorders of bone density and structure, left lower leg: Secondary | ICD-10-CM | POA: Diagnosis not present

## 2018-12-16 DIAGNOSIS — Z23 Encounter for immunization: Secondary | ICD-10-CM

## 2018-12-16 DIAGNOSIS — R739 Hyperglycemia, unspecified: Secondary | ICD-10-CM

## 2018-12-16 LAB — LIPID PANEL
Cholesterol: 165 mg/dL (ref 0–200)
HDL: 42.3 mg/dL (ref >39.00)
LDL Cholesterol: 109 mg/dL — ABNORMAL HIGH (ref 0–99)
NonHDL: 123.12
Total CHOL/HDL Ratio: 4
Triglycerides: 72 mg/dL (ref 0.0–149.0)
VLDL: 14.4 mg/dL (ref 0.0–40.0)

## 2018-12-16 LAB — COMPREHENSIVE METABOLIC PANEL
ALT: 19 U/L (ref 0–35)
AST: 23 U/L (ref 0–37)
Albumin: 4.4 g/dL (ref 3.5–5.2)
Alkaline Phosphatase: 89 U/L (ref 39–117)
BUN: 16 mg/dL (ref 6–23)
CO2: 27 mEq/L (ref 19–32)
Calcium: 10 mg/dL (ref 8.4–10.5)
Chloride: 101 mEq/L (ref 96–112)
Creatinine, Ser: 0.91 mg/dL (ref 0.40–1.20)
GFR: 73.27 mL/min (ref >60.00)
Glucose, Bld: 92 mg/dL (ref 70–99)
Potassium: 3.3 mEq/L — ABNORMAL LOW (ref 3.5–5.1)
Sodium: 141 mEq/L (ref 135–145)
Total Bilirubin: 0.5 mg/dL (ref 0.2–1.2)
Total Protein: 8.6 g/dL — ABNORMAL HIGH (ref 6.0–8.3)

## 2018-12-16 LAB — CBC WITH DIFFERENTIAL/PLATELET
Basophils Absolute: 0.1 10*3/uL (ref 0.0–0.1)
Basophils Relative: 0.8 % (ref 0.0–3.0)
Eosinophils Absolute: 0.1 10*3/uL (ref 0.0–0.7)
Eosinophils Relative: 0.9 % (ref 0.0–5.0)
HCT: 42.4 % (ref 36.0–46.0)
Hemoglobin: 13.7 g/dL (ref 12.0–15.0)
Lymphocytes Relative: 19 % (ref 12.0–46.0)
Lymphs Abs: 1.9 10*3/uL (ref 0.7–4.0)
MCHC: 32.3 g/dL (ref 30.0–36.0)
MCV: 85.7 fl (ref 78.0–100.0)
Monocytes Absolute: 1 10*3/uL (ref 0.1–1.0)
Monocytes Relative: 10.2 % (ref 3.0–12.0)
Neutro Abs: 7 10*3/uL (ref 1.4–7.7)
Neutrophils Relative %: 69.1 % (ref 43.0–77.0)
Platelets: 329 10*3/uL (ref 150.0–400.0)
RBC: 4.95 Mil/uL (ref 3.87–5.11)
RDW: 14.6 % (ref 11.5–15.5)
WBC: 10.1 10*3/uL (ref 4.0–10.5)

## 2018-12-16 LAB — HEMOGLOBIN A1C: Hgb A1c MFr Bld: 5.9 % (ref 4.6–6.5)

## 2018-12-16 LAB — TSH: TSH: 1.24 u[IU]/mL (ref 0.35–4.50)

## 2018-12-16 MED ORDER — ALBUTEROL SULFATE HFA 108 (90 BASE) MCG/ACT IN AERS: 2.0000 | INHALATION_SPRAY | Freq: Four times a day (QID) | RESPIRATORY_TRACT | 1 refills | Status: DC | PRN

## 2018-12-16 NOTE — Assessment & Plan Note (Signed)
dysphasia in past related to GERD No GERD or dysphagia Work on weight loss Continue PPI

## 2018-12-16 NOTE — Assessment & Plan Note (Signed)
Check A1c Work on weight loss, continue regular exercise Low sugar/carbohydrate diet

## 2018-12-16 NOTE — Assessment & Plan Note (Signed)
Check lipid panel, CMP, TSH Continue daily statin Regular exercise and healthy diet encouraged  

## 2018-12-16 NOTE — Assessment & Plan Note (Signed)
dexa up to date Taking vitamin d exercising

## 2018-12-16 NOTE — Assessment & Plan Note (Signed)
BP controlled at home - elevated here -- she has been off her medication for little while - was waiting for it to be mailed to her - has taken the past 2 days, but this may be the reason for the elevated BP today Current regimen effective and well tolerated Continue current medications at current doses Work on weight loss Continue regular exercise cmp

## 2018-12-16 NOTE — Assessment & Plan Note (Signed)
Takes Xanax only as needed, which is not often Okay to continue

## 2018-12-17 ENCOUNTER — Encounter: Payer: Self-pay | Admitting: Internal Medicine

## 2018-12-17 MED ORDER — POTASSIUM CHLORIDE CRYS ER 20 MEQ PO TBCR: 20.0000 meq | EXTENDED_RELEASE_TABLET | Freq: Every day | ORAL | 1 refills | Status: DC

## 2018-12-20 IMAGING — US ULTRASOUND RIGHT BREAST LIMITED
1 series · 11 of 11 positions shown · non-contrast
Comparison: 05/10/2017 and earlier

ACR Breast Density Category a: The breast tissue is almost entirely
fatty.

CLINICAL DATA: Patient returns after screening study for evaluation
of a possible RIGHT breast mass.

EXAM:
DIGITAL DIAGNOSTIC RIGHT MAMMOGRAM WITH CAD AND TOMO
ULTRASOUND RIGHT BREAST

[Series 1: ultrasound right breast limited · 0.07mm/px · 11 of 11 slices shown]
[im 1/11]
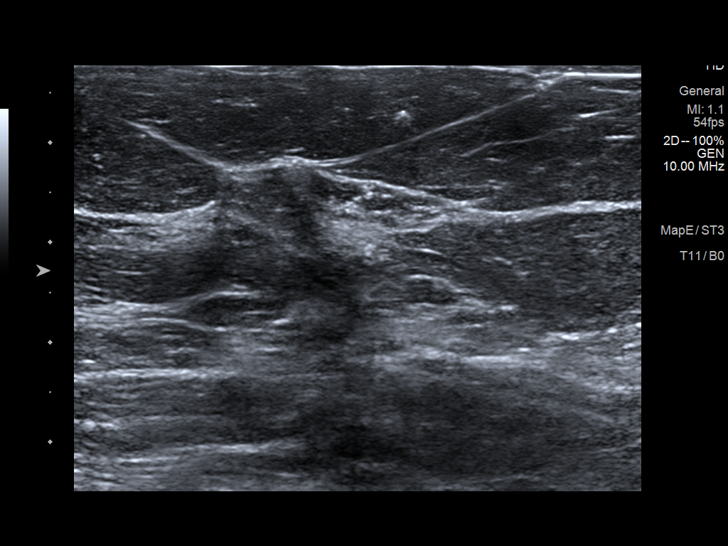
[im 2/11]
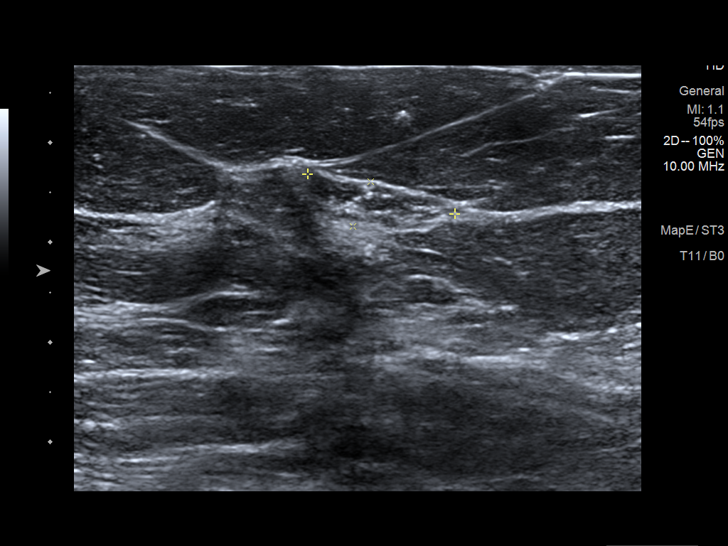
[im 3/11]
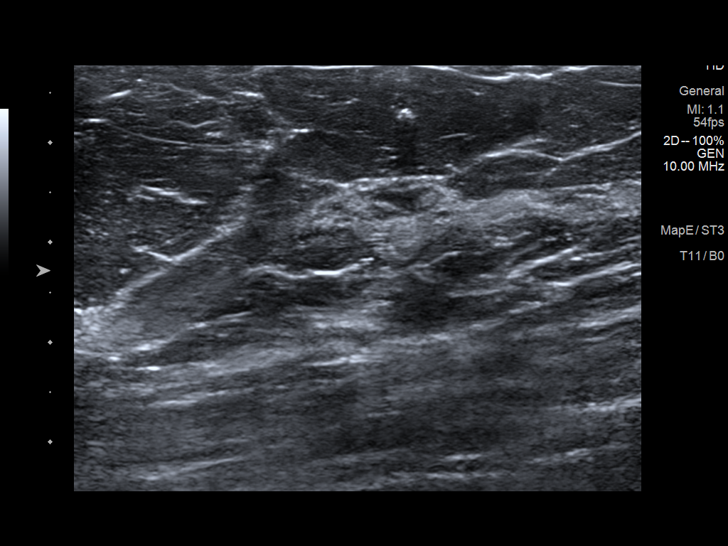
[im 4/11]
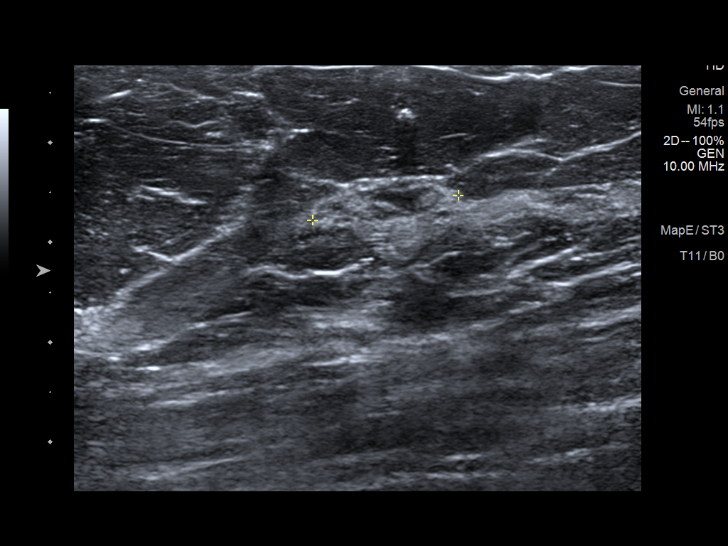
[im 5/11]
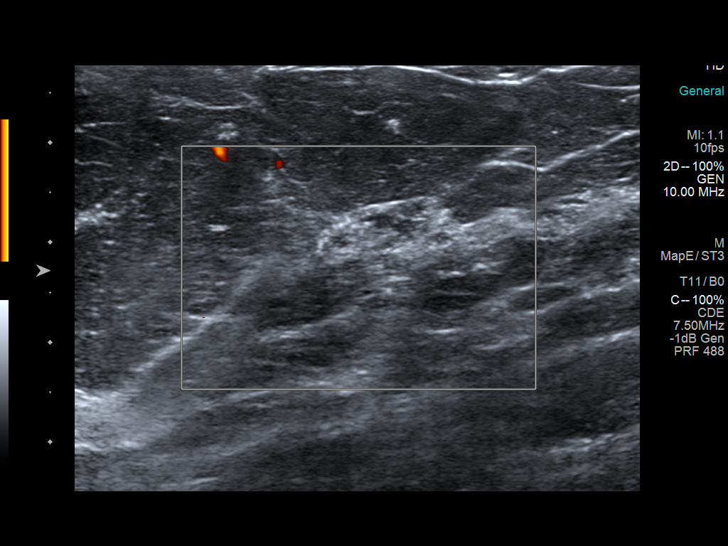
[im 6/11]
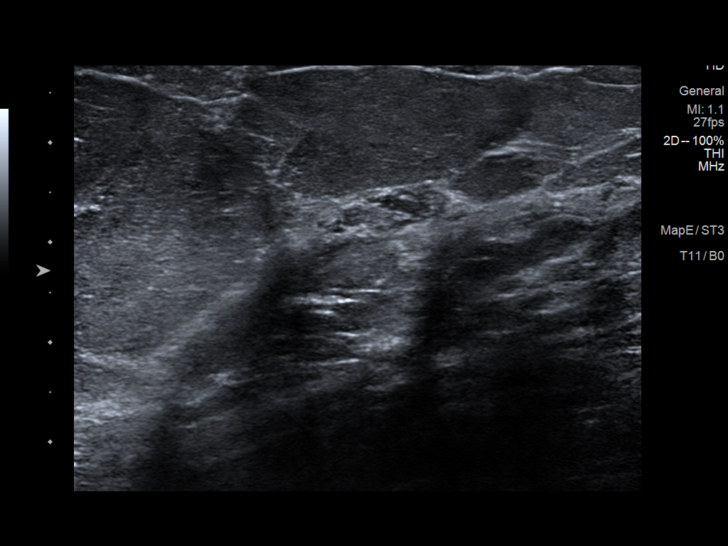
[im 7/11]
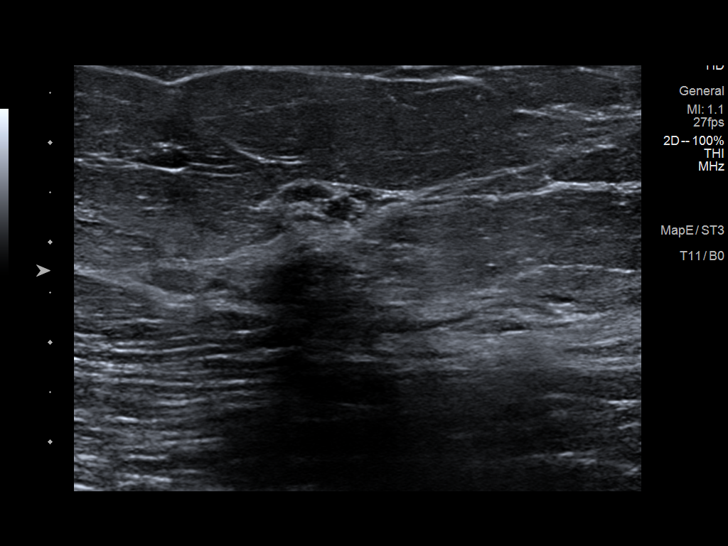
[im 8/11]
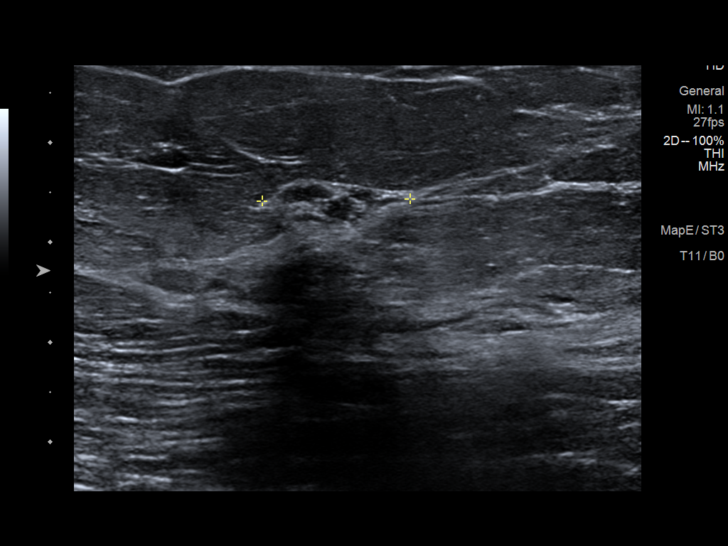
[im 9/11]
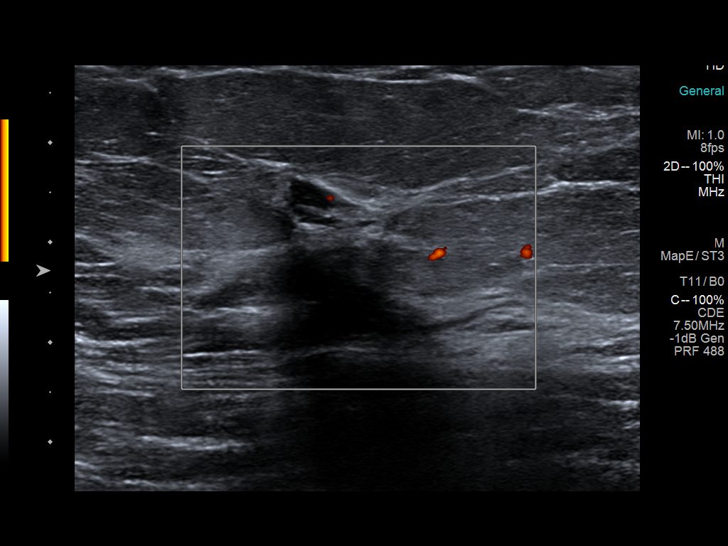
[im 10/11]
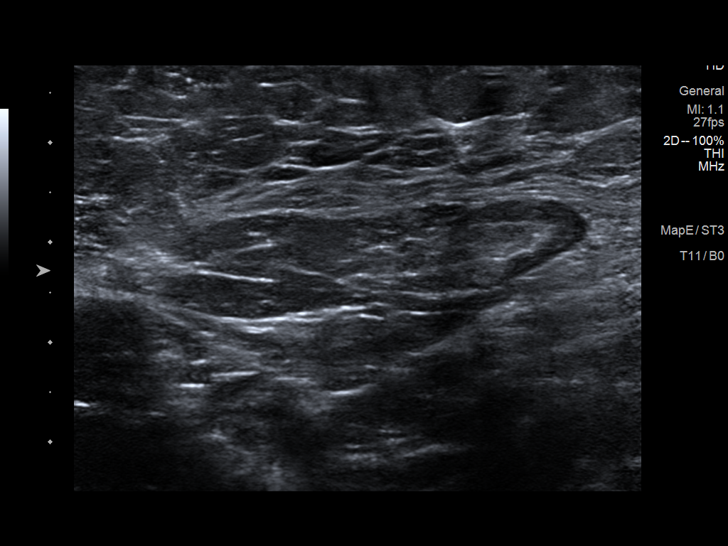
[im 11/11]
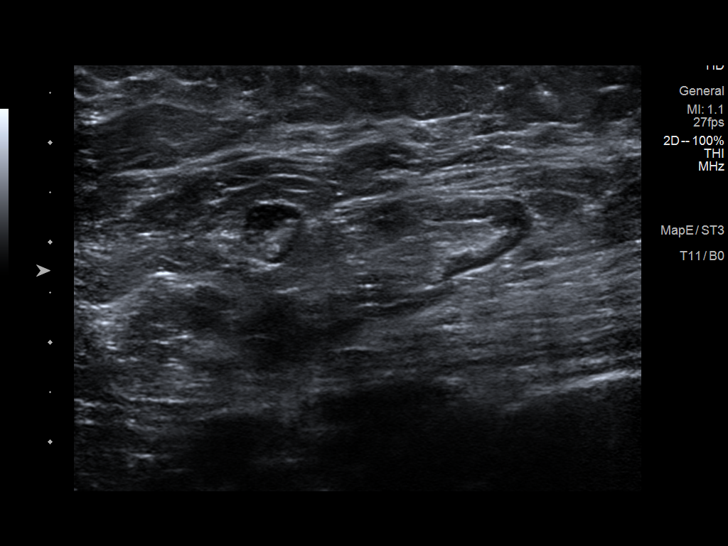

[11 of 11 positions shown; findings below may reference images not displayed]

FINDINGS: Additional 2-D and 3-D images are performed. These views confirm
presence of a mass with indistinct margins in the UPPER-OUTER
QUADRANT of the RIGHT breast. Mass is not associated with distortion
or suspicious microcalcifications.

Mammographic images were processed with CAD.

On physical exam, I palpate no abnormality in the LATERAL aspects of
the RIGHT breast.

Targeted ultrasound is performed, showing a mixed echogenicity mass
in the 10 o'clock location of the RIGHT breast 10 centimeters from
the nipple which measures 1.5 x 0.5 x 1.5 centimeters. Margins are
indistinct. There is associated internal blood flow on Doppler
evaluation. There is mild posterior acoustic shadowing. Evaluation
of the RIGHT axilla is negative for adenopathy.

The BB is placed over the mass in the 10 o'clock location of the
RIGHT breast and a spot tangential view confirms that the
mammographic and sonographic findings are the same.
IMPRESSION: Indeterminate mass in the 10 o'clock location of the RIGHT breast 10
centimeters from the nipple for which biopsy is indicated.

RECOMMENDATION:
Ultrasound-guided core biopsy of mass in the 10 o'clock location of
the RIGHT breast.

I have discussed the findings and recommendations with the patient.
Results were also provided in writing at the conclusion of the
visit. If applicable, a reminder letter will be sent to the patient
regarding the next appointment.

BI-RADS CATEGORY  4: Suspicious.

## 2018-12-22 IMAGING — US US BREAST BX W LOC DEV 1ST LESION IMG BX SPEC US GUIDE*R*
1 series · 11 of 11 positions shown · non-contrast
Comparison: Previous exam(s).

ADDENDUM:
Pathology revealed FIBROCYSTIC CHANGES of Right breast, upper outer,
[DATE]. This was found to be concordant by Dr. Qhophi Ranking.
Pathology results were discussed with the patient by telephone. The
patient reported doing well after the biopsy with tenderness at the
site. Post biopsy instructions and care were reviewed and questions
were answered. The patient was encouraged to call The [REDACTED] for any additional concerns. The patient was
instructed to return for annual screening [facility] at [facility]

Pathology results reported by Jevan Tiger, RN on 05/21/2017.
CLINICAL DATA: Show ultrasound core needle biopsy is recommended of
a mass in the 10 o'clock position of the right breast.
EXAM:
ULTRASOUND GUIDED RIGHT BREAST CORE NEEDLE BIOPSY

[Series 1: us breast bx w loc dev 1st lesion img bx spec us g · 0.07mm/px · 11 of 11 slices shown]
[im 1/11]
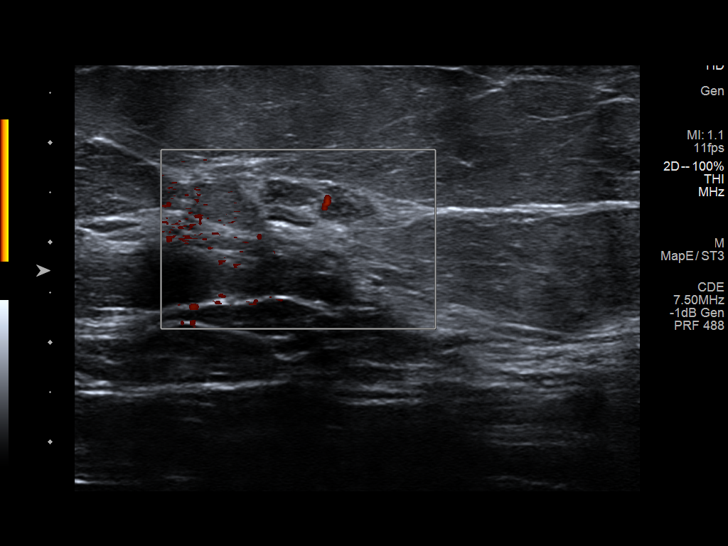
[im 2/11]
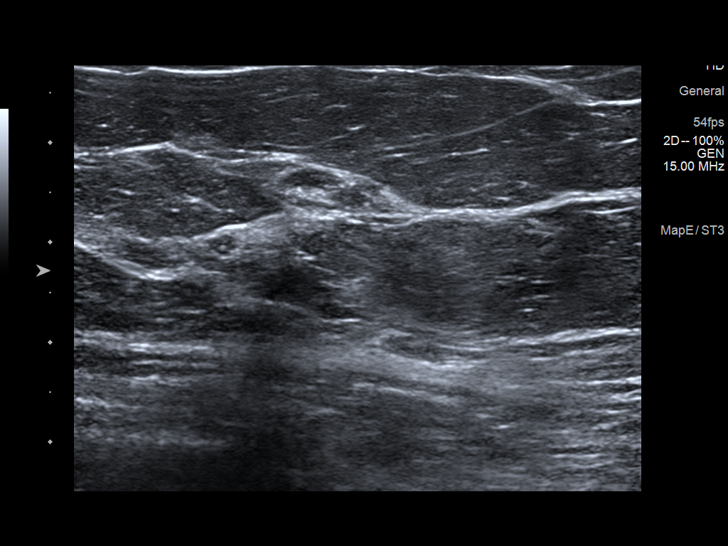
[im 3/11]
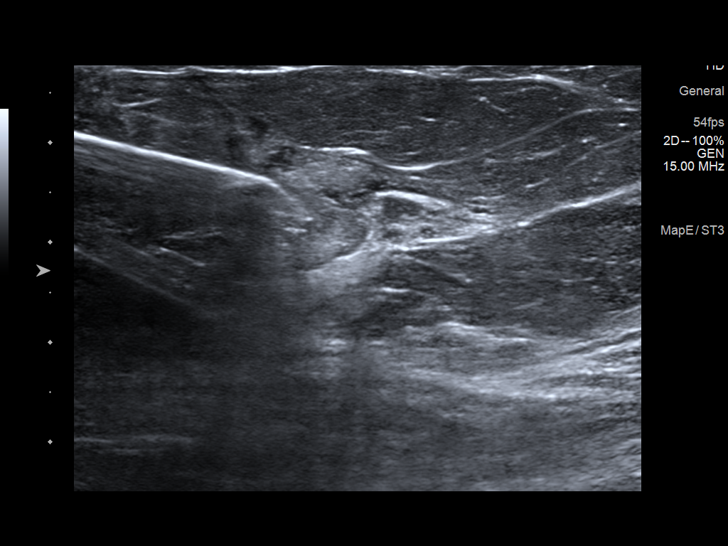
[im 4/11]
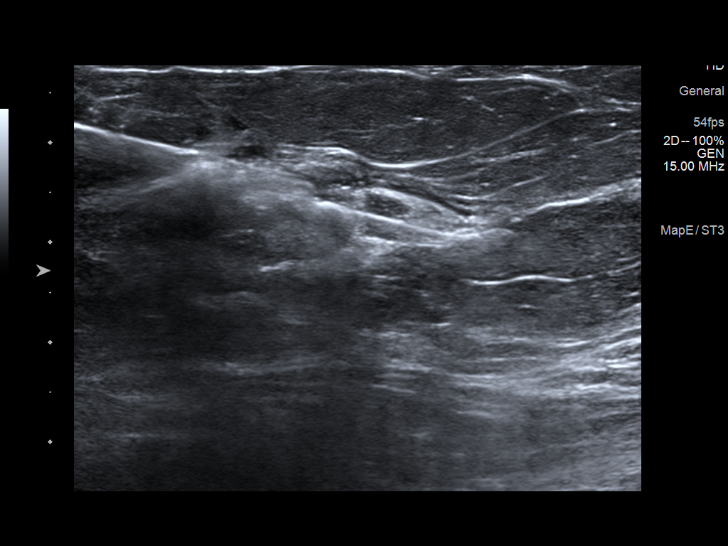
[im 5/11]
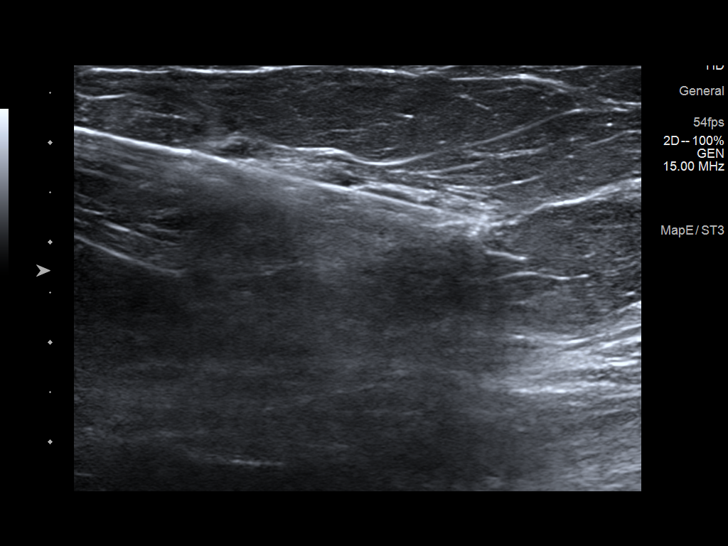
[im 6/11]
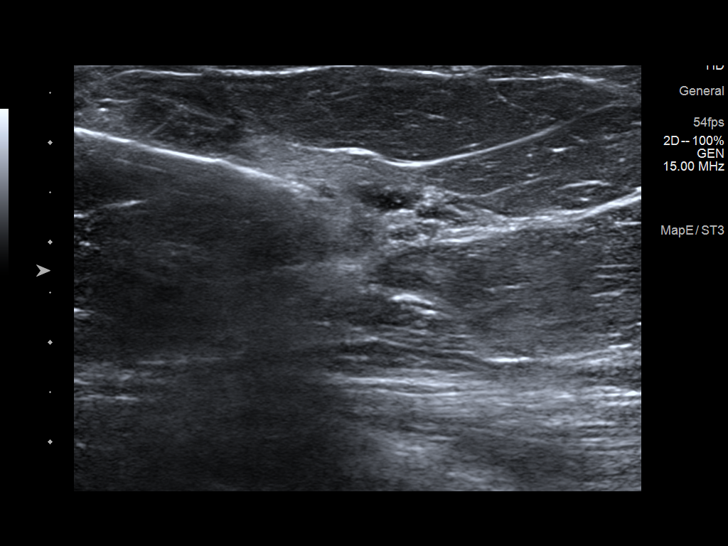
[im 7/11]
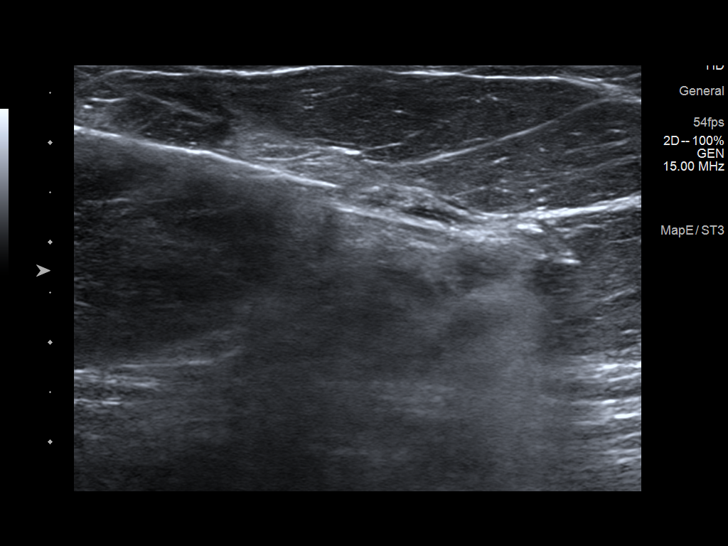
[im 8/11]
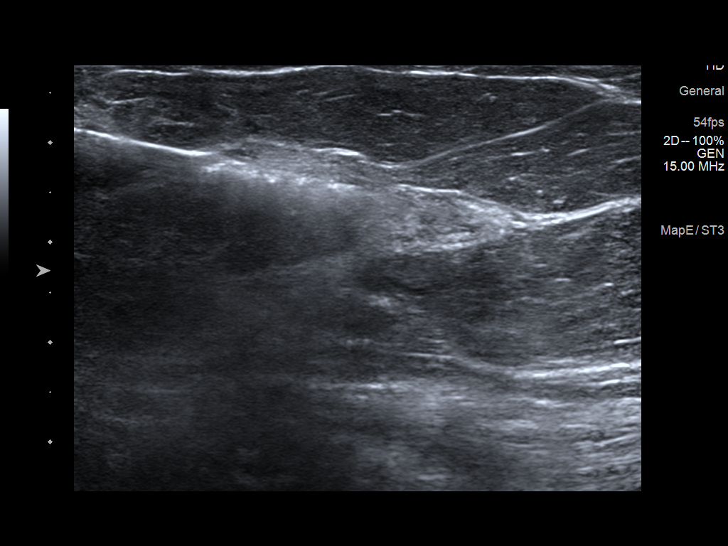
[im 9/11]
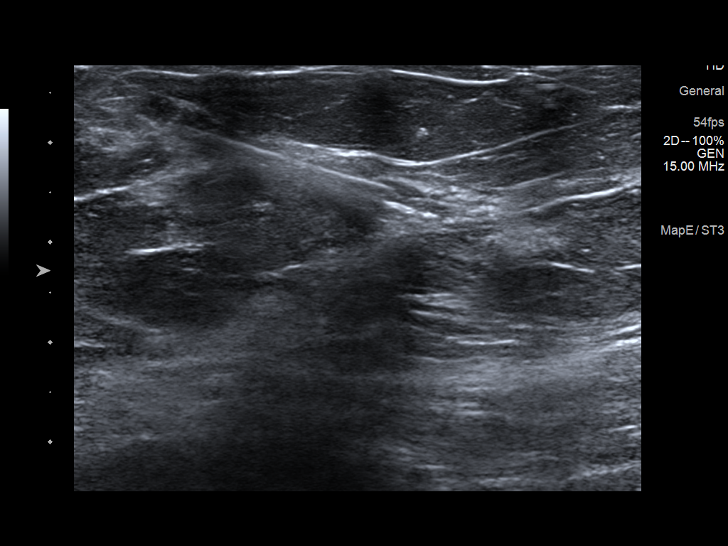
[im 10/11]
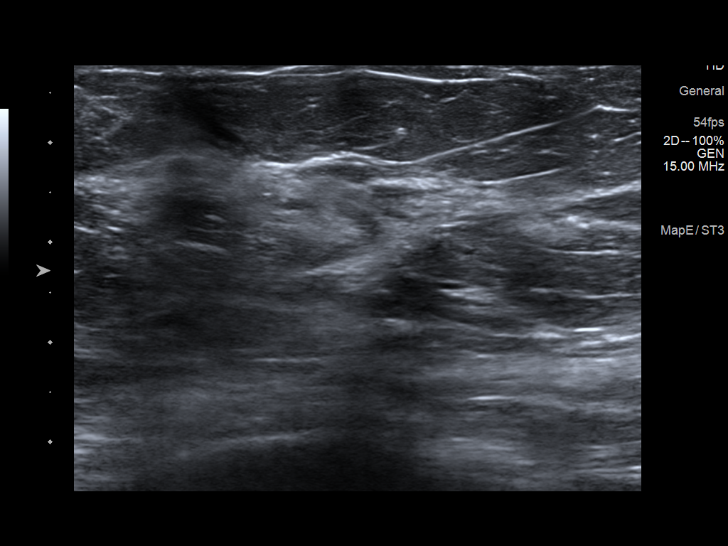
[im 11/11]
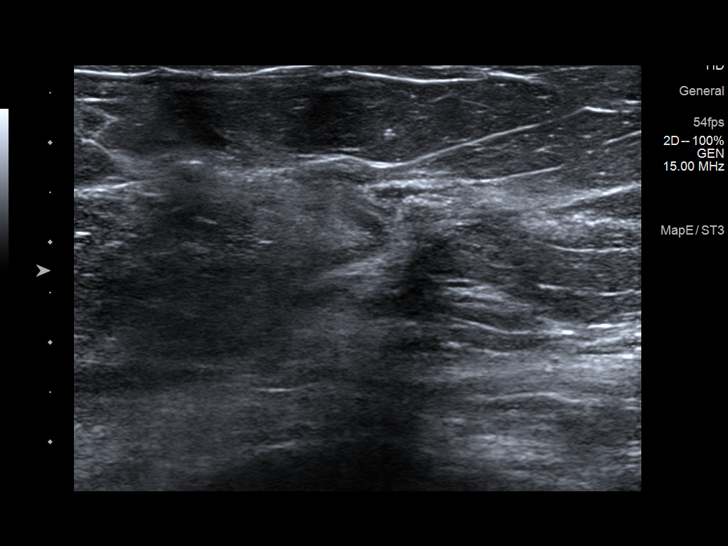

[11 of 11 positions shown; findings below may reference images not displayed]



Lesion quadrant: Upper outer quadrant

Using sterile technique and 1% Lidocaine as local anesthetic, under
direct ultrasound visualization, a 12 gauge Gajen device was
used to perform biopsy of a 1.5 cm mass at 10 o'clock position 10 cm
from the nipple using a lateral approach. At the conclusion of the
procedure a ribbon tissue marker clip was deployed into the biopsy
cavity. Follow up 2 view mammogram was performed and dictated
separately.
IMPRESSION: Ultrasound guided biopsy of the right breast. No apparent
complications.

## 2018-12-22 IMAGING — MG MM CLIP PLACEMENT
2 series · 2 of 2 positions shown · non-contrast
Comparison: Previous exam(s).

CLINICAL DATA: Ultrasound-guided biopsy was performed of a mass in
the 10 o'clock position of the right breast.

EXAM:
DIAGNOSTIC RIGHT MAMMOGRAM POST ULTRASOUND BIOPSY

[R CC]
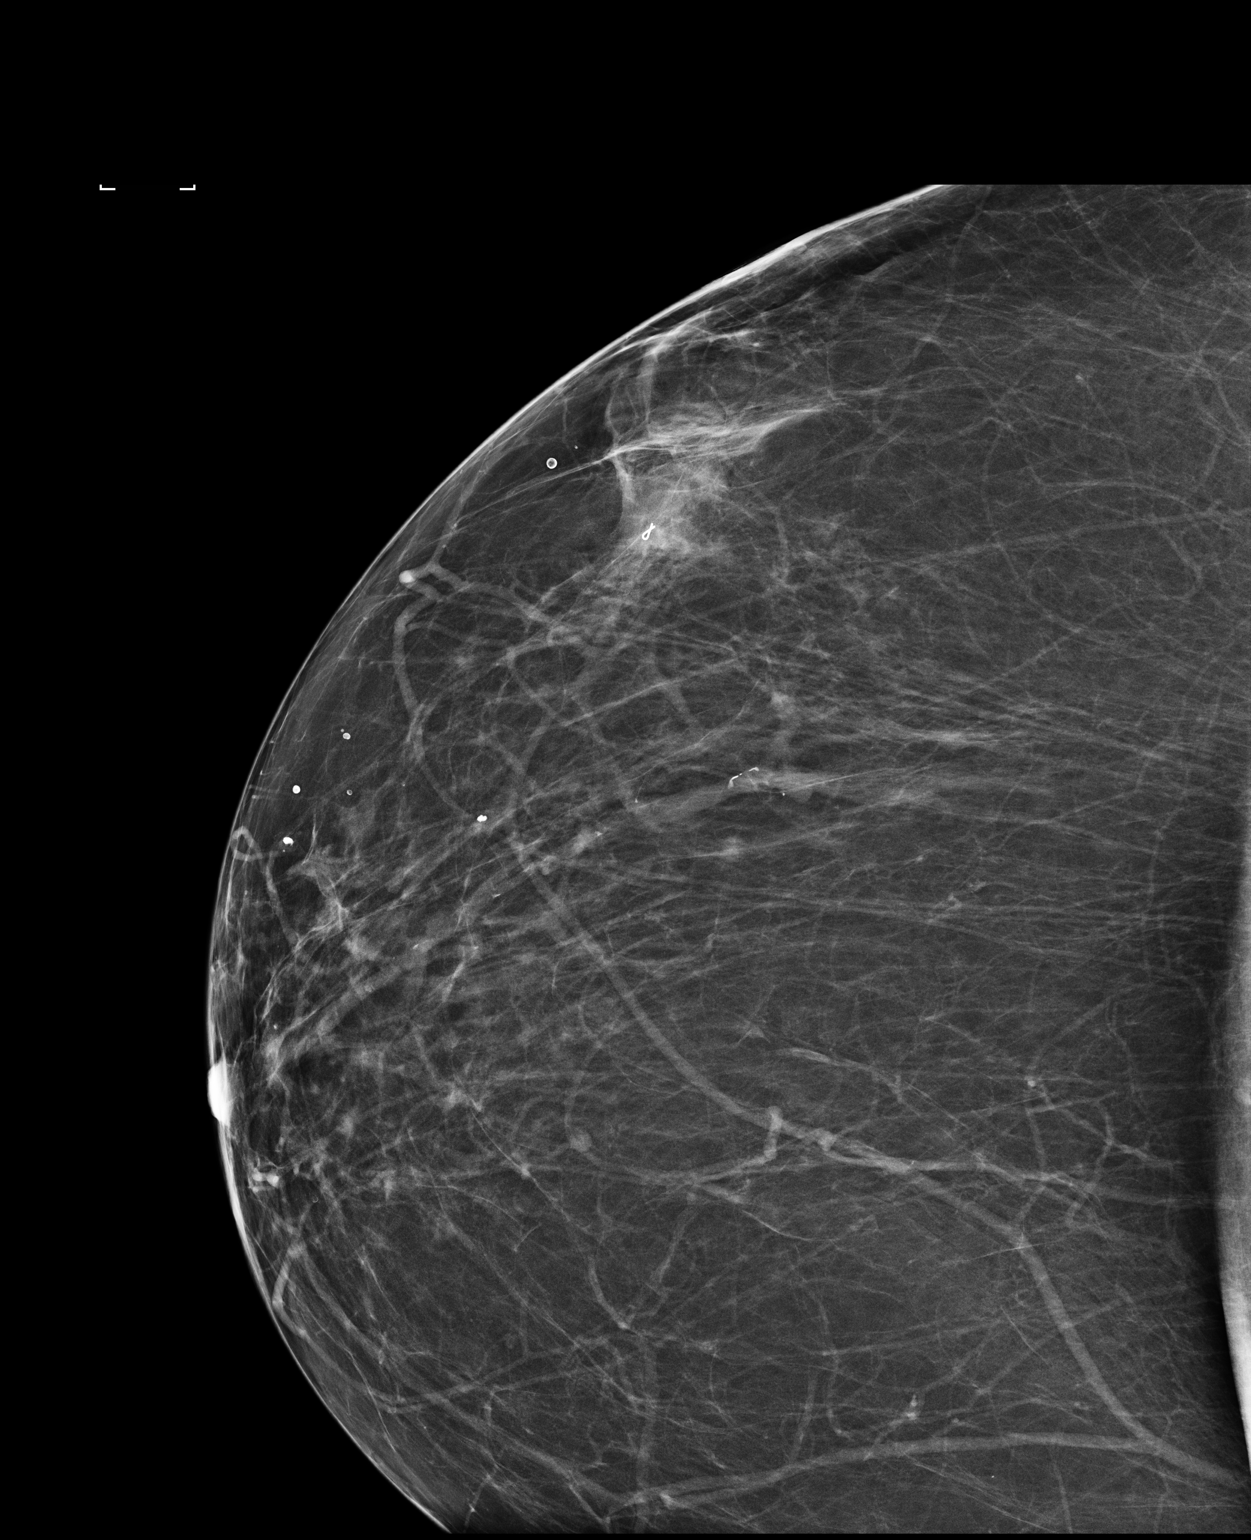

[R ML]
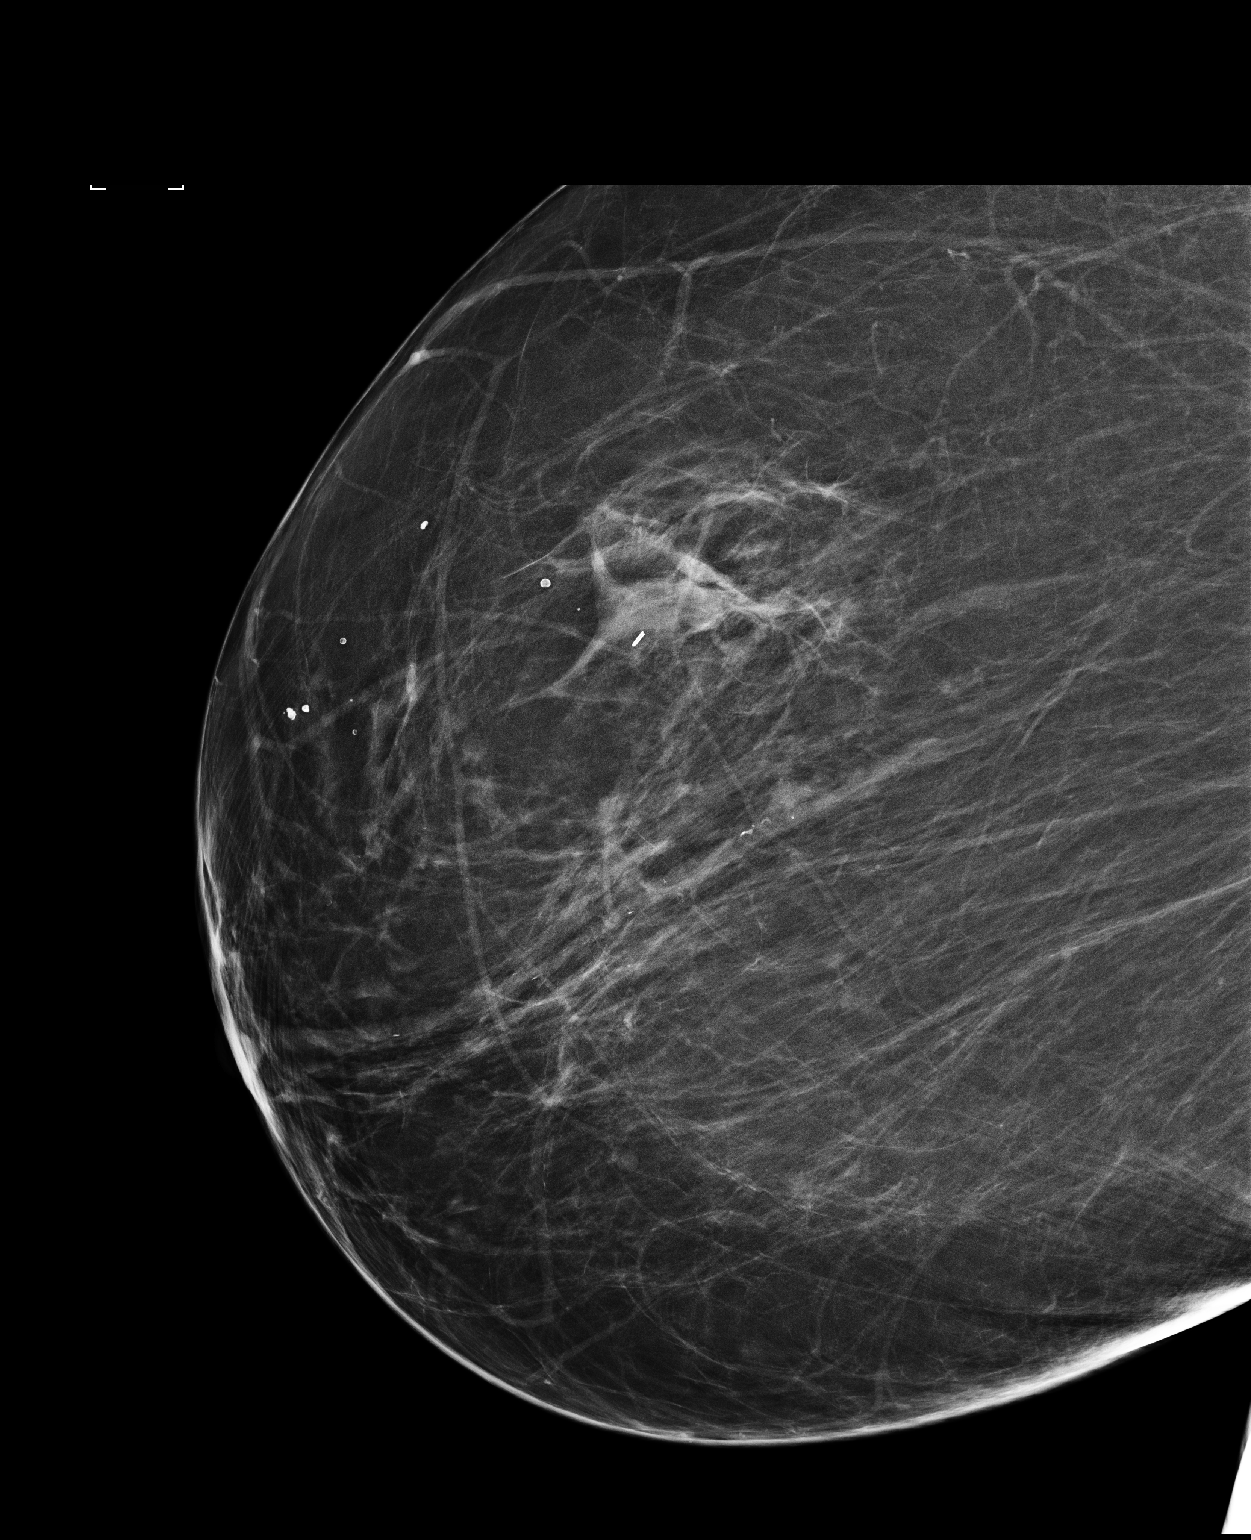

[2 of 2 positions shown; findings below may reference images not displayed]

FINDINGS: Mammographic images were obtained following ultrasound guided biopsy
of a right breast mass at 10 o'clock position. A ribbon shaped
biopsy clip is satisfactorily positioned within the mass.
IMPRESSION: Satisfactory position of ribbon shaped biopsy clip.

Final Assessment: Post Procedure Mammograms for Marker Placement

## 2018-12-31 DIAGNOSIS — Z6841 Body Mass Index (BMI) 40.0 and over, adult: Secondary | ICD-10-CM | POA: Diagnosis not present

## 2018-12-31 DIAGNOSIS — N952 Postmenopausal atrophic vaginitis: Secondary | ICD-10-CM | POA: Diagnosis not present

## 2018-12-31 DIAGNOSIS — Z01419 Encounter for gynecological examination (general) (routine) without abnormal findings: Secondary | ICD-10-CM | POA: Diagnosis not present

## 2018-12-31 DIAGNOSIS — Z1231 Encounter for screening mammogram for malignant neoplasm of breast: Secondary | ICD-10-CM | POA: Diagnosis not present

## 2018-12-31 DIAGNOSIS — H409 Unspecified glaucoma: Secondary | ICD-10-CM | POA: Insufficient documentation

## 2019-01-15 DIAGNOSIS — H401134 Primary open-angle glaucoma, bilateral, indeterminate stage: Secondary | ICD-10-CM | POA: Diagnosis not present

## 2019-02-05 ENCOUNTER — Other Ambulatory Visit: Payer: Self-pay | Admitting: Internal Medicine

## 2019-03-13 ENCOUNTER — Other Ambulatory Visit: Payer: Self-pay | Admitting: Internal Medicine

## 2019-03-21 ENCOUNTER — Other Ambulatory Visit: Payer: Self-pay | Admitting: Internal Medicine

## 2019-03-21 MED ORDER — AMLODIPINE BESYLATE 10 MG PO TABS: 10.0000 mg | ORAL_TABLET | Freq: Every day | ORAL | 0 refills | Status: DC

## 2019-03-21 NOTE — Telephone Encounter (Signed)
Copied from [phone]. Topic: Quick Communication - Rx Refill/Question >> Mar 21, 2019 10:36 AM Leward Quan A wrote: Medication: amLODipine (NORVASC) 10 MG tablet    Has the patient contacted their pharmacy? Yes.   (Agent: If no, request that the patient contact the pharmacy for the refill.) (Agent: If yes, when and what did the pharmacy advise?)  Preferred Pharmacy (with phone number or street name): [facility], [place]  Phone:  [phone] Fax:  [phone]     Agent: Please be advised that RX refills may take up to 3 business days. We ask that you follow-up with your pharmacy.

## 2019-04-01 ENCOUNTER — Other Ambulatory Visit: Payer: Self-pay | Admitting: Internal Medicine

## 2019-04-04 ENCOUNTER — Other Ambulatory Visit: Payer: Self-pay

## 2019-04-04 MED ORDER — BLOOD GLUCOSE MONITOR KIT: PACK | 0 refills | Status: DC

## 2019-04-04 MED ORDER — BD SWAB SINGLE USE REGULAR PADS: MEDICATED_PAD | 1 refills | Status: DC

## 2019-05-09 ENCOUNTER — Other Ambulatory Visit: Payer: Self-pay | Admitting: Internal Medicine

## 2019-05-19 ENCOUNTER — Telehealth: Payer: Self-pay | Admitting: Internal Medicine

## 2019-05-19 NOTE — Chronic Care Management (AMB) (Signed)
  Chronic Care Management   Note  05/19/2019 Name: Shelley Hayes MRN: [id number] DOB: 04-15-1945  Shelley Hayes is a 74 y.o. year old female who is a primary care patient of Burns, Claudina Lick, MD. I reached out to Juluis Rainier by phone today in response to a referral sent by Shelley Hayes PCP, Binnie Rail, MD.   Shelley Hayes was given information about Chronic Care Management services today including:  1. CCM service includes personalized support from designated clinical staff supervised by her physician, including individualized plan of care and coordination with other care providers 2. 24/7 contact phone numbers for assistance for urgent and routine care needs. 3. Service will only be billed when office clinical staff spend 20 minutes or more in a month to coordinate care. 4. Only one practitioner may furnish and bill the service in a calendar month. 5. The patient may stop CCM services at any time (effective at the end of the month) by phone call to the office staff.   Patient agreed to services and verbal consent obtained.   Follow up plan:   Shelley Hayes [employer] Scheduler

## 2019-06-03 NOTE — Chronic Care Management (AMB) (Signed)
Chronic Care Management Pharmacy  Name: Shelley Hayes  MRN: [id number] DOB: 08-22-1945   Chief Complaint/ HPI  Shelley Hayes,  74 y.o. , female presents for their Initial CCM visit with the clinical pharmacist via telephone due to COVID-19 Pandemic.  PCP : Binnie Rail, MD  Their chronic conditions include: HTN, HLD, allergies, arthritis, osteopenia, glaucoma, prediabetes  Lives with daughter, used to living alone.   Office Visits: 12/16/2018 Dr Quay Burow OV: BP elevated, but pt was off BP meds for a few days d/t waiting on delivery. Takes xanax sparingly. No med changes.  04/10/18 Dr Quay Burow OV: URI sx. No med changes.  Consult Visit: n/a  Medications: Outpatient Encounter Medications as of 06/04/2019  Medication Sig  . albuterol (VENTOLIN HFA) 108 (90 Base) MCG/ACT inhaler Inhale 2 puffs into the lungs every 6 (six) hours as needed for wheezing or shortness of breath.  . Alcohol Swabs (B-D SINGLE USE SWABS REGULAR) PADS USE AS DIRECTED  . ALPRAZolam (XANAX) 0.5 MG tablet Take 1 tablet (0.5 mg total) by mouth 2 (two) times daily as needed for anxiety or sleep.  Marland Kitchen amLODipine (NORVASC) 10 MG tablet Take 1 tablet (10 mg total) by mouth daily.  Marland Kitchen aspirin 81 MG tablet Take 81 mg by mouth daily.    Marland Kitchen atorvastatin (LIPITOR) 40 MG tablet TAKE 1 TABLET EVERY DAY  . blood glucose meter kit and supplies KIT Dispense based on patient and insurance preference. Use up to four times daily as directed. R73.03.  Marland Kitchen Cholecalciferol (VITAMIN D3) 1000 UNITS CAPS Take 1 capsule (1,000 Units total) by mouth daily.  . hydrochlorothiazide (MICROZIDE) 12.5 MG capsule TAKE 1 CAPSULE EVERY DAY  . latanoprost (XALATAN) 0.005 % ophthalmic solution PLACE 1 DROP INTO BOTH EYES AT BEDTIME.  . Multiple Vitamin (MULTIVITAMIN) tablet Take 1 tablet by mouth daily.    Marland Kitchen omeprazole (PRILOSEC) 40 MG capsule Take 1 capsule (40 mg total) by mouth daily.  . potassium chloride SA (KLOR-CON) 20 MEQ tablet Take 1 tablet (20 mEq  total) by mouth daily.  . vitamin B-12 (CYANOCOBALAMIN) 1000 MCG tablet Take 1 tablet (1,000 mcg total) by mouth daily.   No facility-administered encounter medications on file as of 06/04/2019.     Current Diagnosis/Assessment:  Goals Addressed   None      Prediabetes   Recent Relevant Labs: Lab Results  Component Value Date/Time   HGBA1C 5.9 12/16/2018 11:35 AM   HGBA1C 5.5 04/10/2018 09:35 AM    No medication indicated.   We discussed: diet and exercise extensively, what carbs are, which foods are high in carbs, importance of exercise. Pt says she walks around neighborhood a few days per week. [facility] [place] [facility], doesn't anymore because too many people out without masks.  Plan  Continue control with diet and exercise  Provided diabetic diet printout   Hypertension   BP today is:  <130/80  Office blood pressures are  BP Readings from Last 3 Encounters:  12/16/18 (!) 148/82  04/10/18 (!) 142/88  11/30/17 132/80    Patient has failed these meds in the past: losartan, olmesartan Patient is currently controlled on the following medications: amlodipine 10 mg daily AM, HCTZ 12.5 mg daily  Patient checks BP at home daily  Patient home BP readings are ranging: 131/77, 127/79, 121/75, 128/77  We discussed diet and exercise extensively, blood pressure goals and emphasized patient is at goal. Pt reports she noticed  BP rises when she eats country ham, pork chops. Discussed role of salt in regulation of BP, advised to adhere to low salt diet.  Plan  Continue current medications and control with diet and exercise    Hyperlipidemia   Lipid Panel     Component Value Date/Time   CHOL 165 12/16/2018 1135   TRIG 72.0 12/16/2018 1135   HDL 42.30 12/16/2018 1135   CHOLHDL 4 12/16/2018 1135   VLDL 14.4 12/16/2018 1135   LDLCALC 109 (H) 12/16/2018 1135   LDLDIRECT 198.9 12/30/2012 1037     The 10-year ASCVD risk score Mikey Bussing DC Jr., et  al., 2013) is: 11.2%   Values used to calculate the score:     Age: 89 years     Sex: Female     Is Non-Hispanic African American: Yes     Diabetic: No     Tobacco smoker: No     Systolic Blood Pressure: 881 mmHg     Is BP treated: Yes     HDL Cholesterol: 42.3 mg/dL     Total Cholesterol: 165 mg/dL   Patient has failed these meds in past: pravastatin Patient is currently uncontrolled on the following medications: atorvastatin 40 mg daily, aspirin 81 mg daily  We discussed:  diet and exercise extensively, discussed types of cholesterol, role of cholesterol in ASCVD, foods to avoid. Pt would like to continue lifestyle modifications to achieve LDL goal < 100.   Plan  Continue current medications and control with diet and exercise    Allergies   Patient has failed these meds in past: n/a Patient is currently controlled on the following medications: albuterol HFA prn  We discussed:  Pt experiences irritation from smoke, goes through about 2 inhalers per year.  Plan  Continue current medications   Anxiety/Insomnia   Patient has failed these meds in past: n/a Patient is currently controlled on the following medications: alprazolam 0.5 mg BID prn  We discussed:  Pt uses med sparingly, fills about once a year. She does note she has been using more often since dog died and other close friends died recently. Still just taking about once a week.    Plan  Continue current medications   GERD   Patient has failed these meds in past: omeprazole Patient is currently controlled on the following medications: Nature Plus - GI Natural (supplement)  We discussed:  Pt had no relief from PPI, started taking natural supplement and reports it works better than anything she's ever tried for heartburn.   Plan  Continue current medications    Glaucoma   Patient has failed these meds in past: n/a Patient is currently controlled on the following medications: latanoprost 0.005% both  eyes HS  We discussed:  Pt takes eye drops as directed, denies issues. Plan  Continue current medications   Health Maintenance   Patient is currently controlled on the following medications: Vitamin D3 1000 IU daily, multivitamin, potassium chloride 20 meq daily, vitamin B12 1000 mcg daily, Vitamin C 1000 mg  We discussed:  Patient is satisfied with current OTC regimen and denies issues. She prefers to get her vitamins through [facility], has used $50 credit through [facility] when funds are low.  Plan  Continue current medications   Medication Management   Pt uses [facility] mail order pharmacy for all medications Uses 7-day pill box Pt endorses 100% compliance  We discussed:  Pt is using [facility] for $0 copays, she does not like that they call her so  much for refills, and gets confused when she does not actually need the refills. Discussed benefits of med sync, packaging and delivery through [facility], pt is interested however her copays would be higher since it is a standard pharmacy with her insurance, so pt will stay with [facility].   Plan  Continue current medication management strategy     Follow up: 6 month phone visit  Charlene Brooke, PharmD Clinical Pharmacist [facility] at [facility] [phone]

## 2019-06-04 ENCOUNTER — Ambulatory Visit: Payer: Medicare PPO | Admitting: Pharmacist

## 2019-06-04 ENCOUNTER — Other Ambulatory Visit: Payer: Self-pay

## 2019-06-04 DIAGNOSIS — R7303 Prediabetes: Secondary | ICD-10-CM

## 2019-06-04 DIAGNOSIS — F419 Anxiety disorder, unspecified: Secondary | ICD-10-CM

## 2019-06-04 DIAGNOSIS — I10 Essential (primary) hypertension: Secondary | ICD-10-CM

## 2019-06-04 DIAGNOSIS — E782 Mixed hyperlipidemia: Secondary | ICD-10-CM

## 2019-06-04 NOTE — Patient Instructions (Addendum)
Visit Information  Thank you for meeting with me to discuss your medications! I look forward to working with you to achieve your health care goals. Below is a summary of what we talked about during the visit:  Goals Addressed            This Visit's Progress   . Pharmacy Care Plan       CARE PLAN ENTRY  Current Barriers:  . Chronic Disease Management support, education, and care coordination needs related to HTN, HLD, and prediabetes  Pharmacist Clinical Goal(s):  Marland Kitchen Maintain BP < 130/80 . Maintain LDL < 100 . Maintain A1c < 6.5% . Ensure safety, efficacy, and affordability of medications  Interventions: . Comprehensive medication review performed. . Discussed ways to prevent prediabetes from progressing to diabetes o Reduce high-carb foods and sweets o Increase exercise . Recommended to sign up for COVID-19 vaccination through [facility]  Patient Self Care Activities:  . Self administers medications as prescribed, Calls pharmacy for medication refills, and Calls provider office for new concerns or questions  Initial goal documentation       Shelley Hayes was given information about Chronic Care Management services today including:  1. CCM service includes personalized support from designated clinical staff supervised by her physician, including individualized plan of care and coordination with other care providers 2. 24/7 contact phone numbers for assistance for urgent and routine care needs. 3. Standard insurance, coinsurance, copays and deductibles apply for chronic care management only during months in which we provide at least 20 minutes of these services. Most insurances cover these services at 100%, however patients may be responsible for any copay, coinsurance and/or deductible if applicable. This service may help you avoid the need for more expensive face-to-face services. 4. Only one practitioner may furnish and bill the service in a calendar month. 5. The patient may  stop CCM services at any time (effective at the end of the month) by phone call to the office staff.  Patient agreed to services and verbal consent obtained.   The patient verbalized understanding of instructions provided today and agreed to receive a mailed copy of patient instruction and/or educational materials. Telephone follow up appointment with pharmacy team member scheduled for: 11/12/19 @ 1:00 pm   Charlene Brooke, PharmD Clinical Pharmacist  [facility] at [facility] [phone]  Preventing Type 2 Diabetes Mellitus  Type 2 diabetes (type 2 diabetes mellitus) is a long-term (chronic) disease that affects blood sugar (glucose) levels. Normally, a hormone called insulin allows glucose to enter cells in the body. The cells use glucose for energy. In type 2 diabetes, one or both of these problems may be present:  The body does not make enough insulin.  The body does not respond properly to insulin that it makes (insulin resistance). Insulin resistance or lack of insulin causes excess glucose to build up in the blood instead of going into cells. As a result, high blood glucose (hyperglycemia) develops, which can cause many complications. Being overweight or obese and having an inactive (sedentary) lifestyle can increase your risk for diabetes. Type 2 diabetes can be delayed or prevented by making certain nutrition and lifestyle changes.  What nutrition changes can be made?   Eat healthy meals and snacks regularly. Keep a healthy snack with you for when you get hungry between meals, such as fruit or a handful of nuts.  Eat lean meats and proteins that are low in saturated fats, such as chicken, fish, egg whites, and beans.  Avoid processed meats.  Eat plenty of fruits and vegetables and plenty of grains that have not been processed (whole grains). It is recommended that you eat: ? 1?2 cups of fruit every day. ? 2?3 cups of vegetables every day. ? 6?8 oz of whole grains  every day, such as oats, whole wheat, bulgur, brown rice, quinoa, and millet.  Eat low-fat dairy products, such as milk, yogurt, and cheese.  Eat foods that contain healthy fats, such as nuts, avocado, olive oil, and canola oil.  Drink water throughout the day. Avoid drinks that contain added sugar, such as soda or sweet tea.  Follow instructions from your health care provider about specific eating or drinking restrictions.  Control how much food you eat at a time (portion size). ? Check food labels to find out the serving sizes of foods. ? Use a kitchen scale to weigh amounts of foods.  Saute or steam food instead of frying it. Cook with water or broth instead of oils or butter.  Limit your intake of: ? Salt (sodium). Have no more than 1 tsp (2,400 mg) of sodium a day. If you have heart disease or high blood pressure, have less than ? tsp (1,500 mg) of sodium a day. ? Saturated fat. This is fat that is solid at room temperature, such as butter or fat on meat. What lifestyle changes can be made? Activity   Do moderate-intensity physical activity for at least 30 minutes on at least 5 days of the week, or as much as told by your health care provider.  Ask your health care provider what activities are safe for you. A mix of physical activities may be best, such as walking, swimming, cycling, and strength training.  Try to add physical activity into your day. For example: ? Park in spots that are farther away than usual, so that you walk more. For example, park in a far corner of the parking lot when you go to the office or the grocery store. ? Take a walk during your lunch break. ? Use stairs instead of elevators or escalators. Weight Loss  Lose weight as directed. Your health care provider can determine how much weight loss is best for you and can help you lose weight safely.  If you are overweight or obese, you may be instructed to lose at least 5?7 % of your body weight. Alcohol  and Tobacco   Limit alcohol intake to no more than 1 drink a day for nonpregnant women and 2 drinks a day for men. One drink equals 12 oz of beer, 5 oz of wine, or 1 oz of hard liquor.  Do not use any tobacco products, such as cigarettes, chewing tobacco, and e-cigarettes. If you need help quitting, ask your health care provider. Work With [facility] Provider  Have your blood glucose tested regularly, as told by your health care provider.  Discuss your risk factors and how you can reduce your risk for diabetes.  Get screening tests as told by your health care provider. You may have screening tests regularly, especially if you have certain risk factors for type 2 diabetes.  Make an appointment with a diet and nutrition specialist (registered dietitian). A registered dietitian can help you make a healthy eating plan and can help you understand portion sizes and food labels. Why are these changes important?  It is possible to prevent or delay type 2 diabetes and related health problems by making lifestyle and nutrition changes.  It can  be difficult to recognize signs of type 2 diabetes. The best way to avoid possible damage to your body is to take actions to prevent the disease before you develop symptoms. What can happen if changes are not made?  Your blood glucose levels may keep increasing. Having high blood glucose for a long time is dangerous. Too much glucose in your blood can damage your blood vessels, heart, kidneys, nerves, and eyes.  You may develop prediabetes or type 2 diabetes. Type 2 diabetes can lead to many chronic health problems and complications, such as: ? Heart disease. ? Stroke. ? Blindness. ? Kidney disease. ? Depression. ? Poor circulation in the feet and legs, which could lead to surgical removal (amputation) in severe cases. Where to find support  Ask your health care provider to recommend a registered dietitian, diabetes educator, or weight loss  program.  Look for local or online weight loss groups.  Join a gym, fitness club, or outdoor activity group, such as a walking club. Where to find more information To learn more about diabetes and diabetes prevention, visit:  American Diabetes Association (ADA): www.diabetes.[employer] of Diabetes and Digestive and Kidney Diseases: FindSpin.nl To learn more about healthy eating, visit:  The U.S. Department of Agriculture Scientist, research (physical sciences)), Choose My Plate: http://wiley-williams.com/  Office of Disease Prevention and Health Promotion (ODPHP), Dietary Guidelines: SurferLive.at Summary  You can reduce your risk for type 2 diabetes by increasing your physical activity, eating healthy foods, and losing weight as directed.  Talk with your health care provider about your risk for type 2 diabetes. Ask about any blood tests or screening tests that you need to have. This information is not intended to replace advice given to you by your health care provider. Make sure you discuss any questions you have with your health care provider. Document Revised: 06/21/2018 Document Reviewed: 04/20/2015 Elsevier Patient Education  [place].

## 2019-06-04 NOTE — Progress Notes (Signed)
I have collaborated with the care management provider regarding care management and care coordination activities outlined in this encounter and have reviewed this encounter including documentation in the note and care plan. I am certifying that I agree with the content of this note and encounter as supervising physician.   Binnie Rail, MD

## 2019-06-05 NOTE — Addendum Note (Signed)
Addended by: Karle Barr on: 06/05/2019 05:28 AM   Modules accepted: Orders

## 2019-06-12 ENCOUNTER — Other Ambulatory Visit: Payer: Self-pay | Admitting: Internal Medicine

## 2019-06-12 ENCOUNTER — Ambulatory Visit: Payer: Medicare PPO

## 2019-06-18 ENCOUNTER — Ambulatory Visit: Payer: Medicare PPO | Admitting: Internal Medicine

## 2019-06-23 ENCOUNTER — Other Ambulatory Visit: Payer: Self-pay | Admitting: Internal Medicine

## 2019-07-16 DIAGNOSIS — H04123 Dry eye syndrome of bilateral lacrimal glands: Secondary | ICD-10-CM | POA: Diagnosis not present

## 2019-07-16 DIAGNOSIS — H43813 Vitreous degeneration, bilateral: Secondary | ICD-10-CM | POA: Diagnosis not present

## 2019-07-16 DIAGNOSIS — H401134 Primary open-angle glaucoma, bilateral, indeterminate stage: Secondary | ICD-10-CM | POA: Diagnosis not present

## 2019-09-27 ENCOUNTER — Other Ambulatory Visit: Payer: Self-pay | Admitting: Internal Medicine

## 2019-10-21 ENCOUNTER — Telehealth: Payer: Self-pay

## 2019-10-21 ENCOUNTER — Other Ambulatory Visit: Payer: Self-pay | Admitting: Internal Medicine

## 2019-10-21 NOTE — Telephone Encounter (Signed)
1.Medication Requested:amLODipine (NORVASC) 10 MG tablet  2. Pharmacy (Name, [facility] [place], [place], [place]  3. On Med List: yes   4. Last Visit with PCP: 10.5.20   5. Next visit date with PCP: n/a   Agent: Please be advised that RX refills may take up to 3 business days. We ask that you follow-up with your pharmacy.

## 2019-10-22 NOTE — Telephone Encounter (Signed)
Left message for patient that refill was already sent in on 10/22/19. She is due for office follow up for any more refills and this message was left for her to contact office and set up appointment.

## 2019-11-12 ENCOUNTER — Other Ambulatory Visit: Payer: Self-pay

## 2019-11-12 ENCOUNTER — Ambulatory Visit: Payer: Medicare PPO | Admitting: Pharmacist

## 2019-11-12 DIAGNOSIS — I10 Essential (primary) hypertension: Secondary | ICD-10-CM

## 2019-11-12 DIAGNOSIS — E782 Mixed hyperlipidemia: Secondary | ICD-10-CM

## 2019-11-12 NOTE — Patient Instructions (Addendum)
Visit Information  Phone number for Pharmacist: [phone]  Goals Addressed            This Visit's Progress   . Pharmacy Care Plan       CARE PLAN ENTRY (see longitudinal plan of care for additional care plan information)  Current Barriers:  . Chronic Disease Management support, education, and care coordination needs related to Hypertension and Hyperlipidemia   Hypertension BP Readings from Last 3 Encounters:  12/16/18 (!) 148/82  04/10/18 (!) 142/88  11/30/17 132/80 .  Pharmacist Clinical Goal(s): o Over the next 180 days, patient will work with PharmD and providers to maintain BP goal <130/80 . Current regimen:  o amlodipine 10 mg daily o HCTZ 12.5 mg daily . Interventions: o Discussed BP goals and benefits of medications for prevention of heart attack / stroke . Patient self care activities - Over the next 180 days, patient will: o Check BP daily, document, and provide at future appointments o Ensure daily salt intake < 2300 mg/day  Hyperlipidemia Lab Results  Component Value Date/Time   LDLCALC 109 (H) 12/16/2018 11:35 AM   LDLDIRECT 198.9 12/30/2012 10:37 AM .  Pharmacist Clinical Goal(s): o Over the next 180 days, patient will work with PharmD and providers to maintain LDL goal < 130 . Current regimen:  o Atorvastatin 40 mg daily . Interventions: o Discussed cholesterol goals and benefits of medications for prevention of heart attack / stroke . Patient self care activities - Over the next 180 days, patient will: o Continue medication as prescribed o Continue low cholesterol diet  Medication management . Pharmacist Clinical Goal(s): o Over the next 180 days, patient will work with PharmD and providers to maintain optimal medication adherence . Current pharmacy: [facility] . Interventions o Comprehensive medication review performed. o Continue current medication management strategy . Patient self care activities - Over the next 180 days, patient  will: o Focus on medication adherence by pill box o Take medications as prescribed o Report any questions or concerns to PharmD and/or provider(s)  Please see past updates related to this goal by clicking on the "Past Updates" button in the selected goal       Patient verbalizes understanding of instructions provided today.  Telephone follow up appointment with pharmacy team member scheduled for: 6 months  Charlene Brooke, PharmD, BCACP Clinical Pharmacist [facility] Menopause is a normal process in which your ability to get pregnant comes to an end. This process happens slowly over many months or years, usually between the ages of 46 and 29. Menopause is complete when you have missed your menstrual periods for 12 months. It is important to talk with your health care provider about some of the most common conditions that affect women after menopause (postmenopausal women). These include heart disease, cancer, and bone loss (osteoporosis). Adopting a healthy lifestyle and getting preventive care can help to promote your health and wellness. The actions you take can also lower your chances of developing some of these common conditions. What should I know about menopause? During menopause, you may get a number of symptoms, such as:  Hot flashes. These can be moderate or severe.  Night sweats.  Decrease in sex drive.  Mood swings.  Headaches.  Tiredness.  Irritability.  Memory problems.  Insomnia. Choosing to treat or not to treat these symptoms is a decision that you make with your health care provider. Do I need hormone replacement  therapy?  Hormone replacement therapy is effective in treating symptoms that are caused by menopause, such as hot flashes and night sweats.  Hormone replacement carries certain risks, especially as you become older. If you are thinking about using estrogen or  estrogen with progestin, discuss the benefits and risks with your health care provider. What is my risk for heart disease and stroke? The risk of heart disease, heart attack, and stroke increases as you age. One of the causes may be a change in the body's hormones during menopause. This can affect how your body uses dietary fats, triglycerides, and cholesterol. Heart attack and stroke are medical emergencies. There are many things that you can do to help prevent heart disease and stroke. Watch your blood pressure  High blood pressure causes heart disease and increases the risk of stroke. This is more likely to develop in people who have high blood pressure readings, are of African descent, or are overweight.  Have your blood pressure checked: ? Every 3-5 years if you are 58-63 years of age. ? Every year if you are 47 years old or older. Eat a healthy diet   Eat a diet that includes plenty of vegetables, fruits, low-fat dairy products, and lean protein.  Do not eat a lot of foods that are high in solid fats, added sugars, or sodium. Get regular exercise Get regular exercise. This is one of the most important things you can do for your health. Most adults should:  Try to exercise for at least 150 minutes each week. The exercise should increase your heart rate and make you sweat (moderate-intensity exercise).  Try to do strengthening exercises at least twice each week. Do these in addition to the moderate-intensity exercise.  Spend less time sitting. Even light physical activity can be beneficial. Other tips  Work with your health care provider to achieve or maintain a healthy weight.  Do not use any products that contain nicotine or tobacco, such as cigarettes, e-cigarettes, and chewing tobacco. If you need help quitting, ask your health care provider.  Know your numbers. Ask your health care provider to check your cholesterol and your blood sugar (glucose). Continue to have your blood  tested as directed by your health care provider. Do I need screening for cancer? Depending on your health history and family history, you may need to have cancer screening at different stages of your life. This may include screening for:  Breast cancer.  Cervical cancer.  Lung cancer.  Colorectal cancer. What is my risk for osteoporosis? After menopause, you may be at increased risk for osteoporosis. Osteoporosis is a condition in which bone destruction happens more quickly than new bone creation. To help prevent osteoporosis or the bone fractures that can happen because of osteoporosis, you may take the following actions:  If you are 71-60 years old, get at least 1,000 mg of calcium and at least 600 mg of vitamin D per day.  If you are older than age 75 but younger than age 62, get at least 1,200 mg of calcium and at least 600 mg of vitamin D per day.  If you are older than age 66, get at least 1,200 mg of calcium and at least 800 mg of vitamin D per day. Smoking and drinking excessive alcohol increase the risk of osteoporosis. Eat foods that are rich in calcium and vitamin D, and do weight-bearing exercises several times each week as directed by your health care provider. How does menopause affect my mental health?  Depression may occur at any age, but it is more common as you become older. Common symptoms of depression include:  Low or sad mood.  Changes in sleep patterns.  Changes in appetite or eating patterns.  Feeling an overall lack of motivation or enjoyment of activities that you previously enjoyed.  Frequent crying spells. Talk with your health care provider if you think that you are experiencing depression. General instructions See your health care provider for regular wellness exams and vaccines. This may include:  Scheduling regular health, dental, and eye exams.  Getting and maintaining your vaccines. These include: ? Influenza vaccine. Get this vaccine each year  before the flu season begins. ? Pneumonia vaccine. ? Shingles vaccine. ? Tetanus, diphtheria, and pertussis (Tdap) booster vaccine. Your health care provider may also recommend other immunizations. Tell your health care provider if you have ever been abused or do not feel safe at home. Summary  Menopause is a normal process in which your ability to get pregnant comes to an end.  This condition causes hot flashes, night sweats, decreased interest in sex, mood swings, headaches, or lack of sleep.  Treatment for this condition may include hormone replacement therapy.  Take actions to keep yourself healthy, including exercising regularly, eating a healthy diet, watching your weight, and checking your blood pressure and blood sugar levels.  Get screened for cancer and depression. Make sure that you are up to date with all your vaccines. This information is not intended to replace advice given to you by your health care provider. Make sure you discuss any questions you have with your health care provider. Document Revised: 02/20/2018 Document Reviewed: 02/20/2018 Elsevier Patient Education  2020 [employer].

## 2019-11-12 NOTE — Chronic Care Management (AMB) (Signed)
Chronic Care Management Pharmacy  Name: Shelley Hayes  MRN: [id number] DOB: 1945-08-16   Chief Complaint/ HPI  Shelley Hayes,  74 y.o. , female presents for their Follow-Up CCM visit with the clinical pharmacist via telephone due to COVID-19 Pandemic.  PCP : Binnie Rail, MD  Their chronic conditions include: HTN, HLD, allergies, arthritis, osteopenia, glaucoma, prediabetes  Pt lives with daughter, which has been an adjustment as she was previously used to living alone. She sometimes watches Youtube videos about medication side effects that worry her.   Office Visits: 12/16/2018 Dr Quay Burow OV: BP elevated, but pt was off BP meds for a few days d/t waiting on delivery. Takes xanax sparingly. No med changes.  04/10/18 Dr Quay Burow OV: URI sx. No med changes.  Consult Visit: n/a  Medications: Outpatient Encounter Medications as of 11/12/2019  Medication Sig  . albuterol (VENTOLIN HFA) 108 (90 Base) MCG/ACT inhaler Inhale 2 puffs into the lungs every 6 (six) hours as needed for wheezing or shortness of breath.  . Alcohol Swabs (B-D SINGLE USE SWABS REGULAR) PADS USE AS DIRECTED (Patient not taking: Reported on 06/04/2019)  . ALPRAZolam (XANAX) 0.5 MG tablet Take 1 tablet (0.5 mg total) by mouth 2 (two) times daily as needed for anxiety or sleep.  Marland Kitchen amLODipine (NORVASC) 10 MG tablet Take 1 tablet (10 mg total) by mouth daily. Annual appt due in Oct must see provider for future refills  . Ascorbic Acid (VITAMIN C) 1000 MG tablet Take 2,000 mg by mouth daily.  Marland Kitchen aspirin 81 MG tablet Take 81 mg by mouth daily.    Marland Kitchen atorvastatin (LIPITOR) 40 MG tablet TAKE 1 TABLET EVERY DAY  . blood glucose meter kit and supplies KIT Dispense based on patient and insurance preference. Use up to four times daily as directed. R73.03. (Patient not taking: Reported on 06/04/2019)  . Cholecalciferol (VITAMIN D3) 1000 UNITS CAPS Take 1 capsule (1,000 Units total) by mouth daily.  . hydrochlorothiazide (MICROZIDE) 12.5 MG  capsule TAKE 1 CAPSULE EVERY DAY  . latanoprost (XALATAN) 0.005 % ophthalmic solution PLACE 1 DROP INTO BOTH EYES AT BEDTIME.  . Multiple Vitamin (MULTIVITAMIN) tablet Take 1 tablet by mouth daily.    Marland Kitchen omeprazole (PRILOSEC) 40 MG capsule Take 1 capsule (40 mg total) by mouth daily. (Patient not taking: Reported on 06/04/2019)  . potassium chloride SA (KLOR-CON) 20 MEQ tablet Take 1 tablet (20 mEq total) by mouth daily.  . vitamin B-12 (CYANOCOBALAMIN) 1000 MCG tablet Take 1 tablet (1,000 mcg total) by mouth daily.   No facility-administered encounter medications on file as of 11/12/2019.     Current Diagnosis/Assessment:  Goals Addressed   None      Hypertension   BP goal is:  <130/80  Office blood pressures are  BP Readings from Last 3 Encounters:  12/16/18 (!) 148/82  04/10/18 (!) 142/88  11/30/17 132/80   Kidney Function Lab Results  Component Value Date/Time   CREATININE 0.91 12/16/2018 11:35 AM   CREATININE 0.86 04/10/2018 09:35 AM   GFR 73.27 12/16/2018 11:35 AM   K 3.3 (L) 12/16/2018 11:35 AM   K 3.9 04/10/2018 09:35 AM   Patient checks BP at home daily  Patient home BP readings are ranging:127/79, 121/75, 128/77  Patient has failed these meds in the past: losartan, olmesartan Patient is currently controlled on the following medications:   amlodipine 10 mg daily AM,   HCTZ 12.5 mg daily  We discussed: BP goals; benefits of medications; low  likelihood of new side effects occurring after tolerating meds for years  Plan  Continue current medications and control with diet and exercise   Hyperlipidemia   LDL goal < 130  Lipid Panel     Component Value Date/Time   CHOL 165 12/16/2018 1135   TRIG 72.0 12/16/2018 1135   HDL 42.30 12/16/2018 1135   CHOLHDL 4 12/16/2018 1135   VLDL 14.4 12/16/2018 1135   LDLCALC 109 (H) 12/16/2018 1135   LDLDIRECT 198.9 12/30/2012 1037    The 10-year ASCVD risk score Mikey Bussing DC Jr., et al., 2013) is: 14.1%   Values used  to calculate the score:     Age: 25 years     Sex: Female     Is Non-Hispanic African American: Yes     Diabetic: No     Tobacco smoker: No     Systolic Blood Pressure: 409 mmHg     Is BP treated: Yes     HDL Cholesterol: 42.3 mg/dL     Total Cholesterol: 165 mg/dL   Patient has failed these meds in past: pravastatin Patient is currently uncontrolled on the following medications:   atorvastatin 40 mg daily  aspirin 81 mg daily  We discussed: cholesterol goal; benefit of statin for ASCVD risk reduction  Plan  Continue current medications and control with diet and exercise    Anxiety/Insomnia   Patient has failed these meds in past: n/a Patient is currently controlled on the following medications:   alprazolam 0.5 mg BID prn  We discussed:  Pt uses med sparingly, fills about once a year. She does note she has been using more often since dog died and other close friends died recently. Still just taking about once a week.    Plan  Continue current medications  Osteopenia    Last DEXA Scan: 04/26/2016   T-Score femoral neck: -1.2  T-Score total hip: n/a  T-Score lumbar spine: +0.8  T-Score forearm radius: n/a  10-year probability of major osteoporotic fracture: 5.8%  10-year probability of hip fracture: 0.6%  No results found for: VD25OH   Patient is not a candidate for pharmacologic treatment  Patient has failed these meds in past: n/a Patient is currently controlled on the following medications:  Marland Kitchen Vitamin D 1000 IU daily  We discussed:  Recommend [phone] units of vitamin D daily.  Plan  Continue current medications  Health Maintenance   Patient is currently controlled on the following medications:   multivitamin,   potassium chloride 20 meq daily,   vitamin B12 1000 mcg daily,   Vitamin C 1000 mg  We discussed:  Patient is satisfied with current OTC regimen and denies issues. She prefers to get her vitamins through [facility], has used  $50 credit through [facility] when funds are low.  Plan  Continue current medications   Medication Management   Pt uses [facility] mail order pharmacy for all medications Uses 7-day pill box Pt endorses 100% compliance  We discussed:  Pt is using [facility] for $0 copays, she does not like that they call her so much for refills, and gets confused when she does not actually need the refills. Discussed benefits of med sync, packaging and delivery through [facility], pt is interested however her copays would be higher since it is a standard pharmacy with her insurance, so pt will stay with [facility].   Plan  Continue current medication management strategy    Follow up: 6 month phone visit  Charlene Brooke, PharmD, [facility] Clinical Pharmacist  [facility] [phone]

## 2019-11-25 ENCOUNTER — Other Ambulatory Visit: Payer: Self-pay | Admitting: Internal Medicine

## 2019-12-19 ENCOUNTER — Other Ambulatory Visit: Payer: Self-pay | Admitting: Internal Medicine

## 2019-12-21 NOTE — Progress Notes (Signed)
Subjective:    Patient ID: Shelley Hayes, female    DOB: 1946/01/11, 74 y.o.   MRN: [id number]   This visit occurred during the SARS-CoV-2 public health emergency.  Safety protocols were in place, including screening questions prior to the visit, additional usage of staff PPE, and extensive cleaning of exam room while observing appropriate contact time as indicated for disinfecting solutions.  HPI She is here for a physical exam.   If she stands for a while she has lower back pain that radiates in to the legs.   The pain radiates to the knees anteriorly.    She has constipation - she take dulcolax laxative.  The stool is soft.  She feels the stool is in the rectum but just won't come out.  If she takes the dulcolax daily.  She is overdue for a colonoscopy.   This is how she felt prior to her last colonoscopy when she had several polyps removed.  She is overdue for follow-up colonoscopy-she was post to have this January 2020.  Sharp pain on left side of head and radiates into left ear.  Occasionally occurs on right side.  May last a few seconds.    Medications and allergies reviewed with patient and updated if appropriate.  Patient Active Problem List   Diagnosis Date Noted  . BPPV (benign paroxysmal positional vertigo) 11/30/2017  . Dysphagia 07/26/2017  . Incomplete emptying of bladder 07/25/2017  . Chest pain 04/24/2017  . Prediabetes 04/24/2017  . Herpes zoster without complication 01/60/1093  . Anxiety 04/24/2017  . Muscle cramps 06/19/2016  . Osteopenia 04/26/2016  . Fluttering heart 12/23/2015  . Cervical paraspinal muscle spasm 12/23/2015  . SCIATICA, LEFT 01/20/2010  . Hyperlipidemia 08/23/2009  . GLUCOMA 08/23/2009  . Essential hypertension 08/23/2009  . Allergic rhinitis 08/23/2009  . ARTHRITIS 08/23/2009  . KNEE PAIN 08/23/2009    Current Outpatient Medications on File Prior to Visit  Medication Sig Dispense Refill  . Alcohol Swabs (B-D SINGLE USE SWABS  REGULAR) PADS USE AS DIRECTED 100 each 1  . Ascorbic Acid (VITAMIN C) 1000 MG tablet Take 2,000 mg by mouth daily.    Marland Kitchen aspirin 81 MG tablet Take 81 mg by mouth daily.      Marland Kitchen atorvastatin (LIPITOR) 40 MG tablet TAKE 1 TABLET EVERY DAY 90 tablet 0  . blood glucose meter kit and supplies KIT Dispense based on patient and insurance preference. Use up to four times daily as directed. R73.03. 1 each 0  . Cholecalciferol (VITAMIN D3) 1000 UNITS CAPS Take 1 capsule (1,000 Units total) by mouth daily. 30 capsule   . latanoprost (XALATAN) 0.005 % ophthalmic solution PLACE 1 DROP INTO BOTH EYES AT BEDTIME. 2.5 mL 0  . Multiple Vitamin (MULTIVITAMIN) tablet Take 1 tablet by mouth daily.      . vitamin B-12 (CYANOCOBALAMIN) 1000 MCG tablet Take 1 tablet (1,000 mcg total) by mouth daily.     No current facility-administered medications on file prior to visit.    Past Medical History:  Diagnosis Date  . ALLERGIC RHINITIS   . Allergic rhinitis 08/23/2009  . Allergy   . Anxiety   . ARTHRITIS   . Arthritis   . Blood in stool 04/24/2017  . Cataract    bil cataracts removes  . Cervical paraspinal muscle spasm 12/23/2015  . Chest pain 04/24/2017  . CHEST PAIN 12/19/2009   Qualifier: Diagnosis of  By: Marca Ancona RMA, Lucy    . COLONIC POLYPS, HX OF  08/23/2009   Qualifier: Diagnosis of  By: Reatha Armour, Lorre Nick    . Cough 06/19/2016  . Fluttering heart 12/23/2015  . GERD (gastroesophageal reflux disease)   . Glaucoma   . GLUCOMA   . Heart murmur   . Herpes zoster without complication 3/50/0938  . Hyperglycemia 06/19/2016  . HYPERLIPIDEMIA   . HYPERTENSION   . KNEE PAIN 08/23/2009   Qualifier: Diagnosis of  By: Asa Lente MD, Jannifer Rodney Muscle cramps 06/19/2016  . Osteopenia 04/26/2016   2/18 - dexa - RFN and LFN  -1.2  . OTITIS EXTERNA, RIGHT 01/20/2010   Qualifier: Diagnosis of  By: Asa Lente MD, Jannifer Rodney Prediabetes 04/24/2017  . SCIATICA, LEFT 11/2010    Past Surgical History:  Procedure Laterality Date   . BREAST BIOPSY Right    benign  . COLONOSCOPY    . ESOPHAGOGASTRODUODENOSCOPY    . TUBAL LIGATION      Social History   Socioeconomic History  . Marital status: Divorced    Spouse name: Not on file  . Number of children: 2  . Years of education: Not on file  . Highest education level: Not on file  Occupational History  . Occupation: Retired  Tobacco Use  . Smoking status: Never Smoker  . Smokeless tobacco: Never Used  Vaping Use  . Vaping Use: Never used  Substance and Sexual Activity  . Alcohol use: No    Alcohol/week: 0.0 standard drinks  . Drug use: No  . Sexual activity: Not Currently  Other Topics Concern  . Not on file  Social History Narrative   Divorced, lives alone- in [place] for 91yrs but moved "home" to [place] in 2008      Walking regularly, every morning   Social Determinants of Health   Financial Resource Strain:   . Difficulty of Paying Living Expenses: Not on file  Food Insecurity:   . Worried About Charity fundraiser in the Last Year: Not on file  . Ran Out of Food in the Last Year: Not on file  Transportation Needs:   . Lack of Transportation (Medical): Not on file  . Lack of Transportation (Non-Medical): Not on file  Physical Activity:   . Days of Exercise per Week: Not on file  . Minutes of Exercise per Session: Not on file  Stress:   . Feeling of Stress : Not on file  Social Connections:   . Frequency of Communication with Friends and Family: Not on file  . Frequency of Social Gatherings with Friends and Family: Not on file  . Attends Religious Services: Not on file  . Active Member of Clubs or Organizations: Not on file  . Attends Archivist Meetings: Not on file  . Marital Status: Not on file    Family History  Problem Relation Age of Onset  . Arthritis Mother   . Diabetes Mother   . Colon cancer Mother 37  . Arthritis Father   . Alcohol abuse Brother   . Lung cancer Brother   . Prostate cancer Brother   . Diabetes  Brother   . Hypertension Brother   . Lung cancer Other        Neice  . Esophageal cancer Neg Hx   . Liver cancer Neg Hx   . Pancreatic cancer Neg Hx   . Rectal cancer Neg Hx   . Stomach cancer Neg Hx     Review of Systems  Constitutional: Negative for chills and  fever.  Eyes: Negative for visual disturbance.  Respiratory: Negative for cough, shortness of breath and wheezing.   Cardiovascular: Negative for chest pain, palpitations and leg swelling.  Gastrointestinal: Positive for constipation. Negative for abdominal pain, blood in stool, diarrhea and nausea.       Occ GERD  Genitourinary: Negative for dysuria and hematuria.  Musculoskeletal: Positive for arthralgias (knee pain) and back pain.  Skin: Negative for rash.  Neurological: Negative for light-headedness and headaches.  Psychiatric/Behavioral: The patient is nervous/anxious (situational - related to deaths).        Objective:   Vitals:   12/22/19 0857  BP: 130/80  Pulse: 86  Temp: 98.2 F (36.8 C)  SpO2: 97%   Filed Weights   12/22/19 0857  Weight: 210 lb (95.3 kg)   Body mass index is 45.44 kg/m.  BP Readings from Last 3 Encounters:  12/22/19 130/80  12/16/18 (!) 148/82  04/10/18 (!) 142/88    Wt Readings from Last 3 Encounters:  12/22/19 210 lb (95.3 kg)  12/16/18 217 lb (98.4 kg)  04/10/18 214 lb 1.9 oz (97.1 kg)    Depression screen [facility] 2/9 12/22/2019 12/22/2019 12/16/2018  Decreased Interest 0 0 0  Down, Depressed, Hopeless 0 0 0  PHQ - 2 Score 0 0 0  Altered sleeping - - -  Tired, decreased energy - - -  Change in appetite - - -  Feeling bad or failure about yourself  - - -  Trouble concentrating - - -  Moving slowly or fidgety/restless - - -  Suicidal thoughts - - -  PHQ-9 Score - - -  Difficult doing work/chores - - -    Physical Exam Constitutional: She appears well-developed and well-nourished. No distress.  HENT:  Head: Normocephalic and atraumatic.  Right Ear: External ear  normal. Normal ear canal and TM Left Ear: External ear normal.  Normal ear canal and TM Mouth/Throat: Oropharynx is clear and moist.  Eyes: Conjunctivae and EOM are normal.  Neck: Neck supple. No tracheal deviation present. No thyromegaly present.  No carotid bruit  Cardiovascular: Normal rate, regular rhythm and normal heart sounds.   No murmur heard.  No edema. Pulmonary/Chest: Effort normal and breath sounds normal. No respiratory distress. She has no wheezes. She has no rales.  Breast: deferred   Abdominal: Soft. She exhibits no distension. There is no tenderness.  Lymphadenopathy: She has no cervical adenopathy.  Skin: Skin is warm and dry. She is not diaphoretic.  Psychiatric: She has a normal mood and affect. Her behavior is normal.        Assessment & Plan:   Physical exam: Screening blood work    ordered Immunizations   Had covid, flu vaccine today, discussed shingrix Colonoscopy  Up to date  Mammogram  Due - gyn office - Dr Gertie Fey - [facility]  Up to date - Dr Gertie Fey Dexa   Due --  Ordered for [facility] exams  Up to date  Exercise  None - stressed regular walking Weight   Advised weight loss Substance abuse   none    Screened for depression using the PHQ 2 scale.  No evidence of depression.     See Problem List for Assessment and Plan of chronic medical problems.

## 2019-12-21 NOTE — Patient Instructions (Addendum)
Blood work was ordered.    Schedule your colonoscopy.   All other Health Maintenance issues reviewed.   All recommended immunizations and age-appropriate screenings are up-to-date or discussed.  Flu immunization administered today.    Medications reviewed and updated.  Changes include :     Stop amlodipine, hydrochlorothiazide and potassium pills Start avalide 150-12.5 mg    Your prescription(s) have been submitted to your pharmacy. Please take as directed and contact our office if you believe you are having problem(s) with the medication(s).  A referral was ordered for physical therapy.   Someone will call you to schedule an appointment.    Please followup in 6 months    Spinal Stenosis  Spinal stenosis occurs when the open space (spinal canal) between the bones of your spine (vertebrae) narrows, putting pressure on the spinal cord or nerves. What are the causes? This condition is caused by areas of bone pushing into the central canals of your vertebrae. This condition may be present at birth (congenital), or it may be caused by:  Arthritic deterioration of your vertebrae (spinal degeneration). This usually starts around age 13.  Injury or trauma to the spine.  Tumors in the spine.  Calcium deposits in the spine. What are the signs or symptoms? Symptoms of this condition include:  Pain in the neck or back that is generally worse with activities, particularly when standing and walking.  Numbness, tingling, hot or cold sensations, weakness, or weariness in your legs.  Pain going up and down the leg (sciatica).  Frequent episodes of falling.  A foot-slapping gait that leads to muscle weakness. In more serious cases, you may develop:  Problemspassing stool or passing urine.  Difficulty having sex.  Loss of feeling in part or all of your leg. Symptoms may come on slowly and get worse over time. How is this diagnosed? This condition is diagnosed based on your  medical history and a physical exam. Tests will also be done, such as:  MRI.  CT scan.  X-ray. How is this treated? Treatment for this condition often focuses on managing your pain and any other symptoms. Treatment may include:  Practicing good posture to lessen pressure on your nerves.  Exercising to strengthen muscles, build endurance, improve balance, and maintain good joint movement (range of motion).  Losing weight, if needed.  Taking medicines to reduce swelling, inflammation, or pain.  Assistive devices, such as a corset or brace. In some cases, surgery may be needed. The most common procedure is decompression laminectomy. This is done to remove excess bone that puts pressure on your nerve roots. Follow these instructions at home: Managing pain, stiffness, and swelling  Do all exercises and stretches as told by your health care provider.  Practice good posture. If you were given a brace or a corset, wear it as told by your health care provider.  Do not do any activities that cause pain. Ask your health care provider what activities are safe for you.  Do not lift anything that is heavier than 10 lb (4.5 kg) or the limit that your health care provider tells you.  Maintain a healthy weight. Talk with your health care provider if you need help losing weight.  If directed, apply heat to the affected area as often as told by your health care provider. Use the heat source that your health care provider recommends, such as a moist heat pack or a heating pad. ? Place a towel between your skin and the heat source. ?  Leave the heat on for 20-30 minutes. ? Remove the heat if your skin turns bright red. This is especially important if you are not able to feel pain, heat, or cold. You may have a greater risk of getting burned. General instructions  Take over-the-counter and prescription medicines only as told by your health care provider.  Do not use any products that contain  nicotine or tobacco, such as cigarettes and e-cigarettes. If you need help quitting, ask your health care provider.  Eat a healthy diet. This includes plenty of fruits and vegetables, whole grains, and low-fat (lean) protein.  Keep all follow-up visits as told by your health care provider. This is important. Contact a health care provider if:  Your symptoms do not get better or they get worse.  You have a fever. Get help right away if:  You have new or worse pain in your neck or upper back.  You have severe pain that cannot be controlled with medicines.  You are dizzy.  You have vision problems, blurred vision, or double vision.  You have a severe headache that is worse when you stand.  You have nausea or you vomit.  You develop new or worse numbness or tingling in your back or legs.  You have pain, redness, swelling, or warmth in your arm or leg. Summary  Spinal stenosis occurs when the open space (spinal canal) between the bones of your spine (vertebrae) narrows. This narrowing puts pressure on the spinal cord or nerves.  Spinal stenosis can cause numbness, weakness, or pain in the neck, back, and legs.  This condition may be caused by a birth defect, arthritic deterioration of your vertebrae, injury, tumors, or calcium deposits.  This condition is usually diagnosed with MRIs, CT scans, and X-rays. This information is not intended to replace advice given to you by your health care provider. Make sure you discuss any questions you have with your health care provider. Document Revised: 02/09/2017 Document Reviewed: 02/02/2016 Elsevier Patient Education  2020 [place] Maintenance, Female Adopting a healthy lifestyle and getting preventive care are important in promoting health and wellness. Ask your health care provider about:  The right schedule for you to have regular tests and exams.  Things you can do on your own to prevent diseases and keep yourself  healthy. What should I know about diet, weight, and exercise? Eat a healthy diet   Eat a diet that includes plenty of vegetables, fruits, low-fat dairy products, and lean protein.  Do not eat a lot of foods that are high in solid fats, added sugars, or sodium. Maintain a healthy weight Body mass index (BMI) is used to identify weight problems. It estimates body fat based on height and weight. Your health care provider can help determine your BMI and help you achieve or maintain a healthy weight. Get regular exercise Get regular exercise. This is one of the most important things you can do for your health. Most adults should:  Exercise for at least 150 minutes each week. The exercise should increase your heart rate and make you sweat (moderate-intensity exercise).  Do strengthening exercises at least twice a week. This is in addition to the moderate-intensity exercise.  Spend less time sitting. Even light physical activity can be beneficial. Watch cholesterol and blood lipids Have your blood tested for lipids and cholesterol at 74 years of age, then have this test every 5 years. Have your cholesterol levels checked more often if:  Your lipid  or cholesterol levels are high.  You are older than 74 years of age.  You are at high risk for heart disease. What should I know about cancer screening? Depending on your health history and family history, you may need to have cancer screening at various ages. This may include screening for:  Breast cancer.  Cervical cancer.  Colorectal cancer.  Skin cancer.  Lung cancer. What should I know about heart disease, diabetes, and high blood pressure? Blood pressure and heart disease  High blood pressure causes heart disease and increases the risk of stroke. This is more likely to develop in people who have high blood pressure readings, are of African descent, or are overweight.  Have your blood pressure checked: ? Every 3-5 years if you are  65-80 years of age. ? Every year if you are 63 years old or older. Diabetes Have regular diabetes screenings. This checks your fasting blood sugar level. Have the screening done:  Once every three years after age 72 if you are at a normal weight and have a low risk for diabetes.  More often and at a younger age if you are overweight or have a high risk for diabetes. What should I know about preventing infection? Hepatitis B If you have a higher risk for hepatitis B, you should be screened for this virus. Talk with your health care provider to find out if you are at risk for hepatitis B infection. Hepatitis C Testing is recommended for:  Everyone born from [birth year] through 1965.  Anyone with known risk factors for hepatitis C. Sexually transmitted infections (STIs)  Get screened for STIs, including gonorrhea and chlamydia, if: ? You are sexually active and are younger than 74 years of age. ? You are older than 74 years of age and your health care provider tells you that you are at risk for this type of infection. ? Your sexual activity has changed since you were last screened, and you are at increased risk for chlamydia or gonorrhea. Ask your health care provider if you are at risk.  Ask your health care provider about whether you are at high risk for HIV. Your health care provider may recommend a prescription medicine to help prevent HIV infection. If you choose to take medicine to prevent HIV, you should first get tested for HIV. You should then be tested every 3 months for as long as you are taking the medicine. Pregnancy  If you are about to stop having your period (premenopausal) and you may become pregnant, seek counseling before you get pregnant.  Take 400 to 800 micrograms (mcg) of folic acid every day if you become pregnant.  Ask for birth control (contraception) if you want to prevent pregnancy. Osteoporosis and menopause Osteoporosis is a disease in which the bones lose  minerals and strength with aging. This can result in bone fractures. If you are 50 years old or older, or if you are at risk for osteoporosis and fractures, ask your health care provider if you should:  Be screened for bone loss.  Take a calcium or vitamin D supplement to lower your risk of fractures.  Be given hormone replacement therapy (HRT) to treat symptoms of menopause. Follow these instructions at home: Lifestyle  Do not use any products that contain nicotine or tobacco, such as cigarettes, e-cigarettes, and chewing tobacco. If you need help quitting, ask your health care provider.  Do not use street drugs.  Do not share needles.  Ask your health care provider  for help if you need support or information about quitting drugs. Alcohol use  Do not drink alcohol if: ? Your health care provider tells you not to drink. ? You are pregnant, may be pregnant, or are planning to become pregnant.  If you drink alcohol: ? Limit how much you use to 0-1 drink a day. ? Limit intake if you are breastfeeding.  Be aware of how much alcohol is in your drink. In the [place] one drink equals one 12 oz bottle of beer (355 mL), one 5 oz glass of wine (148 mL), or one 1 oz glass of hard liquor (44 mL). General instructions  Schedule regular health, dental, and eye exams.  Stay current with your vaccines.  Tell your health care provider if: ? You often feel depressed. ? You have ever been abused or do not feel safe at home. Summary  Adopting a healthy lifestyle and getting preventive care are important in promoting health and wellness.  Follow your health care provider's instructions about healthy diet, exercising, and getting tested or screened for diseases.  Follow your health care provider's instructions on monitoring your cholesterol and blood pressure. This information is not intended to replace advice given to you by your health care provider. Make sure you discuss any questions you  have with your health care provider. Document Revised: 02/20/2018 Document Reviewed: 02/20/2018 Elsevier Patient Education  2020 [employer].

## 2019-12-22 ENCOUNTER — Encounter: Payer: Self-pay | Admitting: Internal Medicine

## 2019-12-22 ENCOUNTER — Ambulatory Visit (INDEPENDENT_AMBULATORY_CARE_PROVIDER_SITE_OTHER): Payer: Medicare PPO | Admitting: Internal Medicine

## 2019-12-22 ENCOUNTER — Other Ambulatory Visit: Payer: Self-pay

## 2019-12-22 VITALS — BP 130/80 | HR 86 | Temp 98.2°F | Ht <= 58 in | Wt 210.0 lb

## 2019-12-22 DIAGNOSIS — I10 Essential (primary) hypertension: Secondary | ICD-10-CM

## 2019-12-22 DIAGNOSIS — F419 Anxiety disorder, unspecified: Secondary | ICD-10-CM

## 2019-12-22 DIAGNOSIS — M85861 Other specified disorders of bone density and structure, right lower leg: Secondary | ICD-10-CM | POA: Diagnosis not present

## 2019-12-22 DIAGNOSIS — R7303 Prediabetes: Secondary | ICD-10-CM | POA: Diagnosis not present

## 2019-12-22 DIAGNOSIS — Z23 Encounter for immunization: Secondary | ICD-10-CM

## 2019-12-22 DIAGNOSIS — E782 Mixed hyperlipidemia: Secondary | ICD-10-CM

## 2019-12-22 DIAGNOSIS — Z0000 Encounter for general adult medical examination without abnormal findings: Secondary | ICD-10-CM

## 2019-12-22 DIAGNOSIS — M4805 Spinal stenosis, thoracolumbar region: Secondary | ICD-10-CM | POA: Diagnosis not present

## 2019-12-22 DIAGNOSIS — M85862 Other specified disorders of bone density and structure, left lower leg: Secondary | ICD-10-CM | POA: Diagnosis not present

## 2019-12-22 LAB — CBC WITH DIFFERENTIAL/PLATELET
Basophils Absolute: 0.1 10*3/uL (ref 0.0–0.1)
Basophils Relative: 0.9 % (ref 0.0–3.0)
Eosinophils Absolute: 0.1 10*3/uL (ref 0.0–0.7)
Eosinophils Relative: 1.3 % (ref 0.0–5.0)
HCT: 43.5 % (ref 36.0–46.0)
Hemoglobin: 13.7 g/dL (ref 12.0–15.0)
Lymphocytes Relative: 21.1 % (ref 12.0–46.0)
Lymphs Abs: 1.9 10*3/uL (ref 0.7–4.0)
MCHC: 31.5 g/dL (ref 30.0–36.0)
MCV: 86.2 fl (ref 78.0–100.0)
Monocytes Absolute: 0.7 10*3/uL (ref 0.1–1.0)
Monocytes Relative: 8.2 % (ref 3.0–12.0)
Neutro Abs: 6.1 10*3/uL (ref 1.4–7.7)
Neutrophils Relative %: 68.5 % (ref 43.0–77.0)
Platelets: 369 10*3/uL (ref 150.0–400.0)
RBC: 5.05 Mil/uL (ref 3.87–5.11)
RDW: 15.8 % — ABNORMAL HIGH (ref 11.5–15.5)
WBC: 8.9 10*3/uL (ref 4.0–10.5)

## 2019-12-22 LAB — COMPREHENSIVE METABOLIC PANEL
ALT: 14 U/L (ref 0–35)
AST: 23 U/L (ref 0–37)
Albumin: 4.4 g/dL (ref 3.5–5.2)
Alkaline Phosphatase: 79 U/L (ref 39–117)
BUN: 13 mg/dL (ref 6–23)
CO2: 28 mEq/L (ref 19–32)
Calcium: 9.6 mg/dL (ref 8.4–10.5)
Chloride: 102 mEq/L (ref 96–112)
Creatinine, Ser: 0.92 mg/dL (ref 0.40–1.20)
GFR: 61.22 mL/min (ref >60.00)
Glucose, Bld: 96 mg/dL (ref 70–99)
Potassium: 3.1 mEq/L — ABNORMAL LOW (ref 3.5–5.1)
Sodium: 141 mEq/L (ref 135–145)
Total Bilirubin: 0.4 mg/dL (ref 0.2–1.2)
Total Protein: 8.7 g/dL — ABNORMAL HIGH (ref 6.0–8.3)

## 2019-12-22 LAB — TSH: TSH: 1.32 u[IU]/mL (ref 0.35–4.50)

## 2019-12-22 LAB — LIPID PANEL
Cholesterol: 265 mg/dL — ABNORMAL HIGH (ref 0–200)
HDL: 45.8 mg/dL (ref >39.00)
LDL Cholesterol: 197 mg/dL — ABNORMAL HIGH (ref 0–99)
NonHDL: 219.61
Total CHOL/HDL Ratio: 6
Triglycerides: 113 mg/dL (ref 0.0–149.0)
VLDL: 22.6 mg/dL (ref 0.0–40.0)

## 2019-12-22 LAB — HEMOGLOBIN A1C: Hgb A1c MFr Bld: 5.6 % (ref 4.6–6.5)

## 2019-12-22 MED ORDER — IRBESARTAN-HYDROCHLOROTHIAZIDE 150-12.5 MG PO TABS: 1.0000 | ORAL_TABLET | Freq: Every day | ORAL | 0 refills | Status: DC

## 2019-12-22 MED ORDER — IRBESARTAN-HYDROCHLOROTHIAZIDE 150-12.5 MG PO TABS
1.0000 | ORAL_TABLET | Freq: Every day | ORAL | 2 refills | Status: DC
Start: 2019-12-22 — End: 2020-02-24

## 2019-12-22 MED ORDER — OMEPRAZOLE 20 MG PO CPDR: 20.0000 mg | DELAYED_RELEASE_CAPSULE | Freq: Every day | ORAL | 3 refills | Status: DC | PRN

## 2019-12-22 MED ORDER — ALPRAZOLAM 0.5 MG PO TABS: 0.5000 mg | ORAL_TABLET | Freq: Two times a day (BID) | ORAL | 0 refills | Status: DC | PRN

## 2019-12-22 MED ORDER — ALBUTEROL SULFATE HFA 108 (90 BASE) MCG/ACT IN AERS: 2.0000 | INHALATION_SPRAY | Freq: Four times a day (QID) | RESPIRATORY_TRACT | 5 refills | Status: AC | PRN

## 2019-12-22 NOTE — Assessment & Plan Note (Signed)
Chronic Check lipid panel  Continue atorvastatin 40 mg daily Regular exercise and healthy diet encouraged  

## 2019-12-22 NOTE — Assessment & Plan Note (Addendum)
Chronic She does not like the amlodipine - will d/c (she does not feel it is working as well) Restart avalide 150-12.5 mg daily, which worked well for her in the past Stop potassium CMP

## 2019-12-22 NOTE — Assessment & Plan Note (Signed)
Chronic, situational only Has taking Xanax only as needed, which is not often-particularly for some of the recent deaths she has had in the family I will renew and she will continue to take this as needed-Xanax 0.5 mg 2 times daily as needed

## 2019-12-22 NOTE — Assessment & Plan Note (Signed)
Chronic DEXA due-ordered for Shelley Hayes regular walking-she was doing this regularly, but has not walked in a while Taking vitamin D daily and multivitamin, continue

## 2019-12-22 NOTE — Assessment & Plan Note (Signed)
Chronic Check a1c Low sugar / carb diet Stressed regular exercise  

## 2019-12-23 ENCOUNTER — Encounter: Payer: Self-pay | Admitting: Gastroenterology

## 2019-12-23 DIAGNOSIS — M4805 Spinal stenosis, thoracolumbar region: Secondary | ICD-10-CM | POA: Diagnosis not present

## 2019-12-23 DIAGNOSIS — E782 Mixed hyperlipidemia: Secondary | ICD-10-CM | POA: Diagnosis not present

## 2019-12-23 DIAGNOSIS — Z23 Encounter for immunization: Secondary | ICD-10-CM | POA: Diagnosis not present

## 2019-12-23 DIAGNOSIS — R7303 Prediabetes: Secondary | ICD-10-CM | POA: Diagnosis not present

## 2019-12-23 DIAGNOSIS — Z0000 Encounter for general adult medical examination without abnormal findings: Secondary | ICD-10-CM | POA: Diagnosis not present

## 2019-12-23 DIAGNOSIS — I10 Essential (primary) hypertension: Secondary | ICD-10-CM | POA: Diagnosis not present

## 2019-12-23 DIAGNOSIS — M85861 Other specified disorders of bone density and structure, right lower leg: Secondary | ICD-10-CM | POA: Diagnosis not present

## 2019-12-23 DIAGNOSIS — M85862 Other specified disorders of bone density and structure, left lower leg: Secondary | ICD-10-CM | POA: Diagnosis not present

## 2019-12-23 DIAGNOSIS — F419 Anxiety disorder, unspecified: Secondary | ICD-10-CM | POA: Diagnosis not present

## 2019-12-23 NOTE — Addendum Note (Signed)
Addended by: Marcina Millard on: 12/23/2019 12:59 PM   Modules accepted: Orders

## 2019-12-26 ENCOUNTER — Other Ambulatory Visit: Payer: Self-pay

## 2019-12-26 MED ORDER — POTASSIUM CHLORIDE CRYS ER 20 MEQ PO TBCR: 20.0000 meq | EXTENDED_RELEASE_TABLET | Freq: Every day | ORAL | 1 refills | Status: DC

## 2019-12-26 NOTE — Addendum Note (Signed)
Addended by: Binnie Rail on: 12/26/2019 08:28 AM   Modules accepted: Orders

## 2019-12-29 ENCOUNTER — Ambulatory Visit (INDEPENDENT_AMBULATORY_CARE_PROVIDER_SITE_OTHER)
Admission: RE | Admit: 2019-12-29 | Discharge: 2019-12-29 | Disposition: A | Discharge status: Home / Self Care | Payer: Medicare PPO | Source: Ambulatory Visit | Attending: Internal Medicine | Admitting: Internal Medicine

## 2019-12-29 ENCOUNTER — Other Ambulatory Visit: Payer: Self-pay

## 2019-12-29 DIAGNOSIS — M85862 Other specified disorders of bone density and structure, left lower leg: Secondary | ICD-10-CM | POA: Diagnosis not present

## 2019-12-29 DIAGNOSIS — M85861 Other specified disorders of bone density and structure, right lower leg: Secondary | ICD-10-CM | POA: Diagnosis not present

## 2019-12-31 ENCOUNTER — Encounter: Payer: Self-pay | Admitting: Internal Medicine

## 2020-01-21 DIAGNOSIS — H04123 Dry eye syndrome of bilateral lacrimal glands: Secondary | ICD-10-CM | POA: Diagnosis not present

## 2020-01-21 DIAGNOSIS — H401134 Primary open-angle glaucoma, bilateral, indeterminate stage: Secondary | ICD-10-CM | POA: Diagnosis not present

## 2020-01-21 DIAGNOSIS — H524 Presbyopia: Secondary | ICD-10-CM | POA: Diagnosis not present

## 2020-02-03 ENCOUNTER — Other Ambulatory Visit: Payer: Self-pay

## 2020-02-03 ENCOUNTER — Ambulatory Visit (AMBULATORY_SURGERY_CENTER): Payer: Self-pay | Admitting: *Deleted

## 2020-02-03 VITALS — Ht <= 58 in | Wt 212.0 lb

## 2020-02-03 DIAGNOSIS — Z86010 Personal history of colonic polyps: Secondary | ICD-10-CM

## 2020-02-03 MED ORDER — PLENVU 140 G PO SOLR: 1.0000 | ORAL | 0 refills | Status: DC

## 2020-02-03 NOTE — Progress Notes (Signed)
J and J vax  No egg or soy allergy known to patient  No issues with past sedation with any surgeries or procedures no intubation problems in the past  No FH of Malignant Hyperthermia No diet pills per patient No home 02 use per patient  No blood thinners per patient  Pt denies issues with constipation  No A fib or A flutter  EMMI video to pt or via [place] 19 guidelines implemented in PV today with Pt and RN   Pt states last colon Suprep was $110 dollars and she cannot afford- will give Plenvu sample as pt lives in [place] and would have to travel 1 hour to get a prep sample- Lot [address]  Exp 06/2020   Due to the COVID-19 pandemic we are asking patients to follow these guidelines. Please only bring one care partner. Please be aware that your care partner may wait in the car in the parking lot or if they feel like they will be too hot to wait in the car, they may wait in the lobby on the 4th floor. All care partners are required to wear a mask the entire time (we do not have any that we can provide them), they need to practice social distancing, and we will do a Covid check for all patient's and care partners when you arrive. Also we will check their temperature and your temperature. If the care partner waits in their car they need to stay in the parking lot the entire time and we will call them on their cell phone when the patient is ready for discharge so they can bring the car to the front of the building. Also all patient's will need to wear a mask into building.

## 2020-02-13 ENCOUNTER — Ambulatory Visit (AMBULATORY_SURGERY_CENTER): Payer: Medicare PPO | Admitting: Gastroenterology

## 2020-02-13 ENCOUNTER — Encounter: Payer: Self-pay | Admitting: Gastroenterology

## 2020-02-13 ENCOUNTER — Other Ambulatory Visit: Payer: Self-pay

## 2020-02-13 VITALS — BP 118/75 | HR 79 | Temp 97.1°F | Resp 18 | Ht <= 58 in | Wt 212.0 lb

## 2020-02-13 DIAGNOSIS — D122 Benign neoplasm of ascending colon: Secondary | ICD-10-CM

## 2020-02-13 DIAGNOSIS — Z86010 Personal history of colonic polyps: Secondary | ICD-10-CM | POA: Diagnosis not present

## 2020-02-13 DIAGNOSIS — D125 Benign neoplasm of sigmoid colon: Secondary | ICD-10-CM | POA: Diagnosis not present

## 2020-02-13 DIAGNOSIS — D128 Benign neoplasm of rectum: Secondary | ICD-10-CM

## 2020-02-13 DIAGNOSIS — D124 Benign neoplasm of descending colon: Secondary | ICD-10-CM

## 2020-02-13 DIAGNOSIS — D123 Benign neoplasm of transverse colon: Secondary | ICD-10-CM | POA: Diagnosis not present

## 2020-02-13 DIAGNOSIS — D120 Benign neoplasm of cecum: Secondary | ICD-10-CM | POA: Diagnosis not present

## 2020-02-13 MED ORDER — SODIUM CHLORIDE 0.9 % IV SOLN: 500.0000 mL | Freq: Once | INTRAVENOUS | Status: DC

## 2020-02-13 NOTE — Progress Notes (Signed)
Called to room to assist during endoscopic procedure.  Patient ID and intended procedure confirmed with present staff. Received instructions for my participation in the procedure from the performing physician.  

## 2020-02-13 NOTE — Progress Notes (Signed)
Pt's states no medical or surgical changes since previsit or office visit. 

## 2020-02-13 NOTE — Progress Notes (Signed)
To PACU, VSS. Report to [email or web address] 

## 2020-02-13 NOTE — Op Note (Signed)
[place] Patient Name: Shelley Hayes Procedure Date: 02/13/2020 3:01 PM MRN: [id number] Endoscopist: Ladene Artist , MD Age: 74 Referring MD:  Date of Birth: 1945/06/24 Gender: Female Account #: [id number] Procedure:                Colonoscopy Indications:              Surveillance: History of numerous (> 10) adenomas                            on last colonoscopy (< 3 yrs) Medicines:                Monitored Anesthesia Care Procedure:                Pre-Anesthesia Assessment:                           - Prior to the procedure, a History and Physical                            was performed, and patient medications and                            allergies were reviewed. The patient's tolerance of                            previous anesthesia was also reviewed. The risks                            and benefits of the procedure and the sedation                            options and risks were discussed with the patient.                            All questions were answered, and informed consent                            was obtained. Prior Anticoagulants: The patient has                            taken no previous anticoagulant or antiplatelet                            agents. ASA Grade Assessment: III - A patient with                            severe systemic disease. After reviewing the risks                            and benefits, the patient was deemed in                            satisfactory condition to undergo the procedure.  After obtaining informed consent, the colonoscope                            was passed under direct vision. Throughout the                            procedure, the patient's blood pressure, pulse, and                            oxygen saturations were monitored continuously. The                            Colonoscope was introduced through the anus and                            advanced to the the cecum,  identified by                            appendiceal orifice and ileocecal valve. The                            ileocecal valve, appendiceal orifice, and rectum                            were photographed. The quality of the bowel                            preparation was good. The colonoscopy was performed                            without difficulty. The patient tolerated the                            procedure well. Scope In: 3:14:48 PM Scope Out: 3:47:51 PM Scope Withdrawal Time: 0 hours 30 minutes 13 seconds  Total Procedure Duration: 0 hours 33 minutes 3 seconds  Findings:                 The perianal and digital rectal examinations were                            normal.                           Ten sessile polyps were found in the transverse                            colon (7), ascending colon, cecum and ileocecal                            valve. The polyps were 5 to 11 mm in size (one                            transverse polyp was 11 mm). These polyps were  removed with a cold snare. Resection and retrieval                            were complete.                           Twenty-four sessile polyps were found in the rectum                            (3), sigmoid colon (11) and descending colon (10).                            The polyps were 5 to 9 mm in size (one sigmoid                            polyp was 9 mm). These polyps were removed with a                            cold snare. Resection and retrieval were complete.                           Internal hemorrhoids were found during                            retroflexion. The hemorrhoids were small and Grade                            I (internal hemorrhoids that do not prolapse).                           The exam was otherwise without abnormality on                            direct and retroflexion views. Complications:            No immediate complications. Estimated blood loss:                             None. Estimated Blood Loss:     Estimated blood loss: none. Impression:               - Ten 5 to 11 mm polyps in the transverse colon, in                            the ascending colon, in the cecum and at the                            ileocecal valve, removed with a cold snare.                            Resected and retrieved.                           - Twenty-four 5 to 9 mm polyps in the rectum, in  the sigmoid colon and in the descending colon,                            removed with a cold snare. Resected and retrieved.                           - Internal hemorrhoids.                           - The examination was otherwise normal on direct                            and retroflexion views. Recommendation:           - Repeat colonoscopy in 1 year for surveillance                            based on pathology results.                           - Patient has a contact number available for                            emergencies. The signs and symptoms of potential                            delayed complications were discussed with the                            patient. Return to normal activities tomorrow.                            Written discharge instructions were provided to the                            patient.                           - Resume previous diet.                           - Continue present medications.                           - Refer for genetic counceling, testing.                           - Await pathology results.                           - No aspirin, ibuprofen, naproxen, or other                            non-steroidal anti-inflammatory drugs for 2 weeks                            after polyp removal.  Ladene Artist, MD 02/13/2020 3:56:05 PM This report has been signed electronically.

## 2020-02-13 NOTE — Patient Instructions (Signed)
HANDOUTS PROVIDED ON: POLYPS & HEMORRHOIDS  The polyps removed today have been sent for pathology.  The results can take 1-3 weeks to receive.  When your next colonoscopy should occur will be based on the pathology results.    You may resume your previous diet and medication schedule.  NO ASPIRIN, IBUPROFEN, NAPROXEN, OR OTHER NSAIDs FOR 2 WEEKS AFTER POLYP REMOVAL.  Thank you for allowing [place] to care for you today!!!  YOU HAD AN ENDOSCOPIC PROCEDURE TODAY AT [facility]:   Refer to the procedure report that was given to you for any specific questions about what was found during the examination.  If the procedure report does not answer your questions, please call your gastroenterologist to clarify.  If you requested that your care partner not be given the details of your procedure findings, then the procedure report has been included in a sealed envelope for you to review at your convenience later.  YOU SHOULD EXPECT: Some feelings of bloating in the abdomen. Passage of more gas than usual.  Walking can help get rid of the air that was put into your GI tract during the procedure and reduce the bloating. If you had a lower endoscopy (such as a colonoscopy or flexible sigmoidoscopy) you may notice spotting of blood in your stool or on the toilet paper. If you underwent a bowel prep for your procedure, you may not have a normal bowel movement for a few days.  Please Note:  You might notice some irritation and congestion in your nose or some drainage.  This is from the oxygen used during your procedure.  There is no need for concern and it should clear up in a day or so.  SYMPTOMS TO REPORT IMMEDIATELY:   Following lower endoscopy (colonoscopy or flexible sigmoidoscopy):  Excessive amounts of blood in the stool  Significant tenderness or worsening of abdominal pains  Swelling of the abdomen that is new, acute  Fever of 100F or higher  For urgent or emergent issues, a  gastroenterologist can be reached at any hour by calling [phone]. Do not use MyChart messaging for urgent concerns.    DIET:  We do recommend a small meal at first, but then you may proceed to your regular diet.  Drink plenty of fluids but you should avoid alcoholic beverages for 24 hours.  ACTIVITY:  You should plan to take it easy for the rest of today and you should NOT DRIVE or use heavy machinery until tomorrow (because of the sedation medicines used during the test).    FOLLOW UP: Our staff will call the number listed on your records Tuesday morning between 7:15 am and 8:15 am to check on you and address any questions or concerns that you may have regarding the information given to you following your procedure. If we do not reach you, we will leave a message.  We will attempt to reach you two times.  During this call, we will ask if you have developed any symptoms of COVID 19. If you develop any symptoms (ie: fever, flu-like symptoms, shortness of breath, cough etc.) before then, please call [phone].  If you test positive for Covid 19 in the 2 weeks post procedure, please call and report this information to [place].    If any biopsies were taken you will be contacted by phone or by letter within the next 1-3 weeks.  Please call us at [phone] if you have not heard about the biopsies in 3  weeks.    SIGNATURES/CONFIDENTIALITY: You and/or your care partner have signed paperwork which will be entered into your electronic medical record.  These signatures attest to the fact that that the information above on your After Visit Summary has been reviewed and is understood.  Full responsibility of the confidentiality of this discharge information lies with you and/or your care-partner.

## 2020-02-17 ENCOUNTER — Telehealth: Payer: Self-pay | Admitting: *Deleted

## 2020-02-17 NOTE — Telephone Encounter (Signed)
  Follow up Call-  Call back number 02/13/2020 07/18/2017  Post procedure Call Back phone  # [phone] cell  Permission to leave phone message Yes Yes  Some recent data might be hidden     Patient questions:  Do you have a fever, pain , or abdominal swelling? No. Pain Score  0 *  Have you tolerated food without any problems? Yes.    Have you been able to return to your normal activities? Yes.    Do you have any questions about your discharge instructions: Diet   No. Medications  No. Follow up visit  No.  Do you have questions or concerns about your Care? No.  Actions: * If pain score is 4 or above: 1. No action needed, pain <4.Have you developed a fever since your procedure? no  2.   Have you had an respiratory symptoms (SOB or cough) since your procedure? no  3.   Have you tested positive for COVID 19 since your procedure no  4.   Have you had any family members/close contacts diagnosed with the COVID 19 since your procedure?  no   If yes to any of these questions please route to Joylene John, RN and Joella Prince, RN

## 2020-02-19 DIAGNOSIS — Z01419 Encounter for gynecological examination (general) (routine) without abnormal findings: Secondary | ICD-10-CM | POA: Diagnosis not present

## 2020-02-19 DIAGNOSIS — Z1231 Encounter for screening mammogram for malignant neoplasm of breast: Secondary | ICD-10-CM | POA: Diagnosis not present

## 2020-02-19 DIAGNOSIS — M25569 Pain in unspecified knee: Secondary | ICD-10-CM | POA: Insufficient documentation

## 2020-02-19 DIAGNOSIS — N952 Postmenopausal atrophic vaginitis: Secondary | ICD-10-CM | POA: Diagnosis not present

## 2020-02-19 DIAGNOSIS — Z6841 Body Mass Index (BMI) 40.0 and over, adult: Secondary | ICD-10-CM | POA: Diagnosis not present

## 2020-02-20 ENCOUNTER — Telehealth: Payer: Self-pay | Admitting: Internal Medicine

## 2020-02-20 ENCOUNTER — Telehealth: Payer: Self-pay | Admitting: Pharmacist

## 2020-02-20 DIAGNOSIS — I10 Essential (primary) hypertension: Secondary | ICD-10-CM

## 2020-02-20 NOTE — Progress Notes (Signed)
Chronic Care Management Pharmacy Assistant   Name: Shelley Hayes  MRN: [id number] DOB: 11/17/1945  Reason for Encounter: Hypertension Adherence Call   PCP : Binnie Rail, MD  Allergies:   Allergies  Allergen Reactions  . Hydrocodone-Homatropine Other (See Comments)    "don't give me that stuff - i'll get hooked!"  . Losartan Other (See Comments)    palpitations    Medications: Outpatient Encounter Medications as of 02/20/2020  Medication Sig  . albuterol (VENTOLIN HFA) 108 (90 Base) MCG/ACT inhaler Inhale 2 puffs into the lungs every 6 (six) hours as needed for wheezing or shortness of breath.  . Alcohol Swabs (B-D SINGLE USE SWABS REGULAR) PADS USE AS DIRECTED  . ALPRAZolam (XANAX) 0.5 MG tablet Take 1 tablet (0.5 mg total) by mouth 2 (two) times daily as needed for anxiety or sleep.  . Ascorbic Acid (VITAMIN C) 1000 MG tablet Take 2,000 mg by mouth daily.  Marland Kitchen aspirin 81 MG tablet Take 81 mg by mouth daily.    Marland Kitchen atorvastatin (LIPITOR) 40 MG tablet TAKE 1 TABLET EVERY DAY  . blood glucose meter kit and supplies KIT Dispense based on patient and insurance preference. Use up to four times daily as directed. R73.03.  Marland Kitchen Cholecalciferol (VITAMIN D3) 1000 UNITS CAPS Take 1 capsule (1,000 Units total) by mouth daily.  . irbesartan-hydrochlorothiazide (AVALIDE) 150-12.5 MG tablet Take 1 tablet by mouth daily. (Patient not taking: Reported on 02/13/2020)  . latanoprost (XALATAN) 0.005 % ophthalmic solution PLACE 1 DROP INTO BOTH EYES AT BEDTIME.  . Multiple Vitamin (MULTIVITAMIN) tablet Take 1 tablet by mouth daily.    . potassium chloride SA (KLOR-CON) 20 MEQ tablet Take 1 tablet (20 mEq total) by mouth daily.  . vitamin B-12 (CYANOCOBALAMIN) 100 MCG tablet Vitamin B12  . vitamin B-12 (CYANOCOBALAMIN) 1000 MCG tablet Take 1 tablet (1,000 mcg total) by mouth daily.   No facility-administered encounter medications on file as of 02/20/2020.    Current Diagnosis: Patient Active  Problem List   Diagnosis Date Noted  . BPPV (benign paroxysmal positional vertigo) 11/30/2017  . Dysphagia 07/26/2017  . Incomplete emptying of bladder 07/25/2017  . Chest pain 04/24/2017  . Prediabetes 04/24/2017  . Herpes zoster without complication 62/69/4854  . Anxiety 04/24/2017  . Muscle cramps 06/19/2016  . Osteopenia 04/26/2016  . Fluttering heart 12/23/2015  . Cervical paraspinal muscle spasm 12/23/2015  . SCIATICA, LEFT 01/20/2010  . Hyperlipidemia 08/23/2009  . GLUCOMA 08/23/2009  . Essential hypertension 08/23/2009  . Allergic rhinitis 08/23/2009  . ARTHRITIS 08/23/2009  . KNEE PAIN 08/23/2009    Goals Addressed   None     Follow-Up:  Pharmacist Review   Reviewed chart prior to disease state call. Spoke with patient regarding BP  Recent Office Vitals: BP Readings from Last 3 Encounters:  02/13/20 118/75  12/22/19 130/80  12/16/18 (!) 148/82   Pulse Readings from Last 3 Encounters:  02/13/20 79  12/22/19 86  12/16/18 97    Wt Readings from Last 3 Encounters:  02/13/20 212 lb (96.2 kg)  02/03/20 212 lb (96.2 kg)  12/22/19 210 lb (95.3 kg)     Kidney Function Lab Results  Component Value Date/Time   CREATININE 0.92 12/22/2019 10:00 AM   CREATININE 0.91 12/16/2018 11:35 AM   GFR 61.22 12/22/2019 10:00 AM    BMP Latest Ref Rng & Units 12/22/2019 12/16/2018 04/10/2018  Glucose 70 - 99 mg/dL 96 92 98  BUN 6 - 23 mg/dL 13 16 20  Creatinine 0.40 - 1.20 mg/dL 0.92 0.91 0.86  Sodium 135 - 145 mEq/L 141 141 142  Potassium 3.5 - 5.1 mEq/L 3.1(L) 3.3(L) 3.9  Chloride 96 - 112 mEq/L 102 101 103  CO2 19 - 32 mEq/L $Remove'28 27 27  'qHKLICF$ Calcium 8.4 - 32.0 mg/dL 9.6 10.0 9.7    . Current antihypertensive regimen: The patient regime for blood pressure is irbesartan-hydrochlorothiazide  . How often are you checking your Blood Pressure? The patient states that she does take her blood pressure every morning  . Current home BP readings: The patient states that her blood  pressure this morning was 151/89 and yesterday it was 180/96 at Dr. Gaetano Net office  . What recent interventions/DTPs have been made by any provider to improve Blood Pressure control since last CPP Visit: The patient states that her blood pressure is being treat with medication but that she was taking amlodipine but it does not help with lowering her blood pressure so she is only taking irbesartan  . Any recent hospitalizations or ED visits since last visit with CPP? The patient has not been to the hospital or ED recently  . What diet changes have been made to improve Blood Pressure Control? The patient has changed her diet to lower blood pressure  . What exercise is being done to improve your Blood Pressure Control? The patient states that she does not exercise   Adherence Review: Is the patient currently on ACE/ARB medication? Yes the patient is on Irbesartan-HCTZ Does the patient have >5 day gap between last estimated fill dates? No   Wendy Poet, Clinical Pharmacist Assistant [facility]

## 2020-02-20 NOTE — Telephone Encounter (Signed)
So she is taking 2 BP pills daily or a dose of 300-25 mg daily?  That is ok to continue but she needs to come in and have her kidney function and potassium checked next week.  Bmp ordered for GV.   If her BP is still high with that dose we need to start another medication as well ( I can send amlodipine in) - let me know if that is correct ( BP still high with the above 2 pills daily)  We need to get her BP controlled  - she needs to come in the beginning of Jan to follow up.

## 2020-02-20 NOTE — Telephone Encounter (Signed)
Schedule asap so we can adjust meds

## 2020-02-20 NOTE — Telephone Encounter (Signed)
      1. What are your last BP readings?  180/94, 150/98 today  2. Are you having any other symptoms (ex. Dizziness, headache, blurred vision, passed out)? No  3. What is your BP issue?Pateint states she is taking an extra  irbesartan-hydrochlorothiazide (AVALIDE) 150-12.5 MG tablet each day. Declined appointment

## 2020-02-23 ENCOUNTER — Other Ambulatory Visit: Payer: Self-pay

## 2020-02-23 NOTE — Progress Notes (Signed)
Subjective:    Patient ID: Shelley Hayes, female    DOB: 06-30-1945, 74 y.o.   MRN: [id number]  HPI The patient is here for follow up of their chronic medical problems, including htn, hyperlipidemia, anxiety, hyperglycemia, dysphagia  Two months ago we stopped amlodipine, hctz and potassium.  We restarted avalide 150-12.5  BP has been elevated once in a while, but it is now elevated every day. The lowest she has seen was 142.     She is compliant with a low sodium diet.  She is not exercising regularly.     Medications and allergies reviewed with patient and updated if appropriate.  Patient Active Problem List   Diagnosis Date Noted   BPPV (benign paroxysmal positional vertigo) 11/30/2017   Dysphagia 07/26/2017   Incomplete emptying of bladder 07/25/2017   Chest pain 04/24/2017   Prediabetes 04/24/2017   Herpes zoster without complication 42/68/3419   Anxiety 04/24/2017   Muscle cramps 06/19/2016   Osteopenia 04/26/2016   Fluttering heart 12/23/2015   Cervical paraspinal muscle spasm 12/23/2015   SCIATICA, LEFT 01/20/2010   Hyperlipidemia 08/23/2009   GLUCOMA 08/23/2009   Essential hypertension 08/23/2009   Allergic rhinitis 08/23/2009   ARTHRITIS 08/23/2009   KNEE PAIN 08/23/2009    Current Outpatient Medications on File Prior to Visit  Medication Sig Dispense Refill   albuterol (VENTOLIN HFA) 108 (90 Base) MCG/ACT inhaler Inhale 2 puffs into the lungs every 6 (six) hours as needed for wheezing or shortness of breath. 18 g 5   Alcohol Swabs (B-D SINGLE USE SWABS REGULAR) PADS USE AS DIRECTED 100 each 1   ALPRAZolam (XANAX) 0.5 MG tablet Take 1 tablet (0.5 mg total) by mouth 2 (two) times daily as needed for anxiety or sleep. 30 tablet 0   Ascorbic Acid (VITAMIN C) 1000 MG tablet Take 2,000 mg by mouth daily.     aspirin 81 MG tablet Take 81 mg by mouth daily.     atorvastatin (LIPITOR) 40 MG tablet TAKE 1 TABLET EVERY DAY 90 tablet 0   blood  glucose meter kit and supplies KIT Dispense based on patient and insurance preference. Use up to four times daily as directed. R73.03. 1 each 0   Cholecalciferol (VITAMIN D3) 1000 UNITS CAPS Take 1 capsule (1,000 Units total) by mouth daily. 30 capsule    irbesartan-hydrochlorothiazide (AVALIDE) 150-12.5 MG tablet Take 1 tablet by mouth daily. 90 tablet 2   latanoprost (XALATAN) 0.005 % ophthalmic solution PLACE 1 DROP INTO BOTH EYES AT BEDTIME. 2.5 mL 0   Multiple Vitamin (MULTIVITAMIN) tablet Take 1 tablet by mouth daily.     potassium chloride SA (KLOR-CON) 20 MEQ tablet Take 1 tablet (20 mEq total) by mouth daily. 90 tablet 1   vitamin B-12 (CYANOCOBALAMIN) 100 MCG tablet Vitamin B12     vitamin B-12 (CYANOCOBALAMIN) 1000 MCG tablet Take 1 tablet (1,000 mcg total) by mouth daily.     No current facility-administered medications on file prior to visit.    Past Medical History:  Diagnosis Date   ALLERGIC RHINITIS    Allergic rhinitis 08/23/2009   Allergy    Anxiety    ARTHRITIS    Arthritis    Blood in stool 04/24/2017   Cataract    bil cataracts removes   Cervical paraspinal muscle spasm 12/23/2015   Chest pain 04/24/2017   CHEST PAIN 12/19/2009   Qualifier: Diagnosis of  By: Marca Ancona RMA, Lucy     COLONIC POLYPS, HX OF 08/23/2009  Qualifier: Diagnosis of  By: Marca Ancona RMA, Lucy     Cough 06/19/2016   Fluttering heart 12/23/2015   GERD (gastroesophageal reflux disease)    Glaucoma    GLUCOMA    Heart murmur    Herpes zoster without complication 0/34/7425   Hyperglycemia 06/19/2016   HYPERLIPIDEMIA    HYPERTENSION    KNEE PAIN 08/23/2009   Qualifier: Diagnosis of  By: Asa Lente MD, Mateo Flow A    Muscle cramps 06/19/2016   Osteopenia 04/26/2016   2/18 - dexa - RFN and LFN  -1.2   OTITIS EXTERNA, RIGHT 01/20/2010   Qualifier: Diagnosis of  By: Asa Lente MD, Jannifer Rodney    Prediabetes 04/24/2017   SCIATICA, LEFT 11/2010    Past Surgical History:  Procedure  Laterality Date   BREAST BIOPSY Right    benign   COLONOSCOPY     ESOPHAGOGASTRODUODENOSCOPY     POLYPECTOMY     TUBAL LIGATION     UPPER GASTROINTESTINAL ENDOSCOPY      Social History   Socioeconomic History   Marital status: Divorced    Spouse name: Not on file   Number of children: 2   Years of education: Not on file   Highest education level: Not on file  Occupational History   Occupation: Retired  Tobacco Use   Smoking status: Never Smoker   Smokeless tobacco: Never Used  Scientific laboratory technician Use: Never used  Substance and Sexual Activity   Alcohol use: No    Alcohol/week: 0.0 standard drinks   Drug use: No   Sexual activity: Not Currently  Other Topics Concern   Not on file  Social History Narrative   Divorced, lives alone- in [place] for 22yrs but moved "home" to [place] in 2008      Walking regularly, every morning   Social Determinants of Health   Financial Resource Strain: Not on file  Food Insecurity: Not on file  Transportation Needs: Not on file  Physical Activity: Not on file  Stress: Not on file  Social Connections: Not on file    Family History  Problem Relation Age of Onset   Arthritis Mother    Diabetes Mother    Colon cancer Mother 62   Colon polyps Mother    Arthritis Father    Alcohol abuse Brother    Lung cancer Brother    Prostate cancer Brother    Diabetes Brother    Hypertension Brother    Lung cancer Other        Neice   Esophageal cancer Neg Hx    Liver cancer Neg Hx    Pancreatic cancer Neg Hx    Rectal cancer Neg Hx    Stomach cancer Neg Hx     Review of Systems  Constitutional: Positive for appetite change (decreased). Negative for fever.  Respiratory: Negative for shortness of breath.   Cardiovascular: Positive for palpitations (occ). Negative for chest pain and leg swelling.  Neurological: Negative for dizziness, light-headedness and headaches.       Objective:   Vitals:    02/24/20 1303  BP: (!) 150/88  Pulse: 91  Temp: 98.8 F (37.1 C)  SpO2: 97%   BP Readings from Last 3 Encounters:  02/24/20 (!) 150/88  02/13/20 118/75  12/22/19 130/80   Wt Readings from Last 3 Encounters:  02/24/20 208 lb (94.3 kg)  02/13/20 212 lb (96.2 kg)  02/03/20 212 lb (96.2 kg)   Body mass index is 45.01 kg/m.   Physical Exam  Constitutional: Appears well-developed and well-nourished. No distress.  HENT:  Head: Normocephalic and atraumatic.  Neck: Neck supple. No tracheal deviation present. No thyromegaly present.  No cervical lymphadenopathy Cardiovascular: Normal rate, regular rhythm and normal heart sounds.   No murmur heard. No carotid bruit .  No edema Pulmonary/Chest: Effort normal and breath sounds normal. No respiratory distress. No has no wheezes. No rales.  Skin: Skin is warm and dry. Not diaphoretic.  Psychiatric: Normal mood and affect. Behavior is normal.      Assessment & Plan:    See Problem List for Assessment and Plan of chronic medical problems.    This visit occurred during the SARS-CoV-2 public health emergency.  Safety protocols were in place, including screening questions prior to the visit, additional usage of staff PPE, and extensive cleaning of exam room while observing appropriate contact time as indicated for disinfecting solutions.

## 2020-02-23 NOTE — Patient Instructions (Addendum)
Blood work was ordered.  Have blood work done next Monday or Tuesday   Medications changes include :  Increase avalide to 2 tabs daily   Your prescription(s) have been submitted to your pharmacy. Please take as directed and contact our office if you believe you are having problem(s) with the medication(s).    Please followup in 3 week    DASH Eating Plan DASH stands for "Dietary Approaches to Stop Hypertension." The DASH eating plan is a healthy eating plan that has been shown to reduce high blood pressure (hypertension). It may also reduce your risk for type 2 diabetes, heart disease, and stroke. The DASH eating plan may also help with weight loss. What are tips for following this plan?  General guidelines  Avoid eating more than 2,300 mg (milligrams) of salt (sodium) a day. If you have hypertension, you may need to reduce your sodium intake to 1,500 mg a day.  Limit alcohol intake to no more than 1 drink a day for nonpregnant women and 2 drinks a day for men. One drink equals 12 oz of beer, 5 oz of wine, or 1 oz of hard liquor.  Work with your health care provider to maintain a healthy body weight or to lose weight. Ask what an ideal weight is for you.  Get at least 30 minutes of exercise that causes your heart to beat faster (aerobic exercise) most days of the week. Activities may include walking, swimming, or biking.  Work with your health care provider or diet and nutrition specialist (dietitian) to adjust your eating plan to your individual calorie needs. Reading food labels   Check food labels for the amount of sodium per serving. Choose foods with less than 5 percent of the Daily Value of sodium. Generally, foods with less than 300 mg of sodium per serving fit into this eating plan.  To find whole grains, look for the word "whole" as the first word in the ingredient list. Shopping  Buy products labeled as "low-sodium" or "no salt added."  Buy fresh foods. Avoid canned  foods and premade or frozen meals. Cooking  Avoid adding salt when cooking. Use salt-free seasonings or herbs instead of table salt or sea salt. Check with your health care provider or pharmacist before using salt substitutes.  Do not fry foods. Cook foods using healthy methods such as baking, boiling, grilling, and broiling instead.  Cook with heart-healthy oils, such as olive, canola, soybean, or sunflower oil. Meal planning  Eat a balanced diet that includes: ? 5 or more servings of fruits and vegetables each day. At each meal, try to fill half of your plate with fruits and vegetables. ? Up to 6-8 servings of whole grains each day. ? Less than 6 oz of lean meat, poultry, or fish each day. A 3-oz serving of meat is about the same size as a deck of cards. One egg equals 1 oz. ? 2 servings of low-fat dairy each day. ? A serving of nuts, seeds, or beans 5 times each week. ? Heart-healthy fats. Healthy fats called Omega-3 fatty acids are found in foods such as flaxseeds and coldwater fish, like sardines, salmon, and mackerel.  Limit how much you eat of the following: ? Canned or prepackaged foods. ? Food that is high in trans fat, such as fried foods. ? Food that is high in saturated fat, such as fatty meat. ? Sweets, desserts, sugary drinks, and other foods with added sugar. ? Full-fat dairy products.  Do not  salt foods before eating.  Try to eat at least 2 vegetarian meals each week.  Eat more home-cooked food and less restaurant, buffet, and fast food.  When eating at a restaurant, ask that your food be prepared with less salt or no salt, if possible. What foods are recommended? The items listed may not be a complete list. Talk with your dietitian about what dietary choices are best for you. Grains Whole-grain or whole-wheat bread. Whole-grain or whole-wheat pasta. Brown rice. Modena Morrow. Bulgur. Whole-grain and low-sodium cereals. Pita bread. Low-fat, low-sodium crackers.  Whole-wheat flour tortillas. Vegetables Fresh or frozen vegetables (raw, steamed, roasted, or grilled). Low-sodium or reduced-sodium tomato and vegetable juice. Low-sodium or reduced-sodium tomato sauce and tomato paste. Low-sodium or reduced-sodium canned vegetables. Fruits All fresh, dried, or frozen fruit. Canned fruit in natural juice (without added sugar). Meat and other protein foods Skinless chicken or [place]. Ground chicken or [place]. Pork with fat trimmed off. Fish and seafood. Egg whites. Dried beans, peas, or lentils. Unsalted nuts, nut butters, and seeds. Unsalted canned beans. Lean cuts of beef with fat trimmed off. Low-sodium, lean deli meat. Dairy Low-fat (1%) or fat-free (skim) milk. Fat-free, low-fat, or reduced-fat cheeses. Nonfat, low-sodium ricotta or cottage cheese. Low-fat or nonfat yogurt. Low-fat, low-sodium cheese. Fats and oils Soft margarine without trans fats. Vegetable oil. Low-fat, reduced-fat, or light mayonnaise and salad dressings (reduced-sodium). Canola, safflower, olive, soybean, and sunflower oils. Avocado. Seasoning and other foods Herbs. Spices. Seasoning mixes without salt. Unsalted popcorn and pretzels. Fat-free sweets. What foods are not recommended? The items listed may not be a complete list. Talk with your dietitian about what dietary choices are best for you. Grains Baked goods made with fat, such as croissants, muffins, or some breads. Dry pasta or rice meal packs. Vegetables Creamed or fried vegetables. Vegetables in a cheese sauce. Regular canned vegetables (not low-sodium or reduced-sodium). Regular canned tomato sauce and paste (not low-sodium or reduced-sodium). Regular tomato and vegetable juice (not low-sodium or reduced-sodium). Angie Fava. Olives. Fruits Canned fruit in a light or heavy syrup. Fried fruit. Fruit in cream or butter sauce. Meat and other protein foods Fatty cuts of meat. Ribs. Fried meat. Berniece Salines. Sausage. Bologna and other  processed lunch meats. Salami. Fatback. Hotdogs. Bratwurst. Salted nuts and seeds. Canned beans with added salt. Canned or smoked fish. Whole eggs or egg yolks. Chicken or [place] with skin. Dairy Whole or 2% milk, cream, and half-and-half. Whole or full-fat cream cheese. Whole-fat or sweetened yogurt. Full-fat cheese. Nondairy creamers. Whipped toppings. Processed cheese and cheese spreads. Fats and oils Butter. Stick margarine. Lard. Shortening. Ghee. Bacon fat. Tropical oils, such as coconut, palm kernel, or palm oil. Seasoning and other foods Salted popcorn and pretzels. Onion salt, garlic salt, seasoned salt, table salt, and sea salt. Worcestershire sauce. Tartar sauce. Barbecue sauce. Teriyaki sauce. Soy sauce, including reduced-sodium. Steak sauce. Canned and packaged gravies. Fish sauce. Oyster sauce. Cocktail sauce. Horseradish that you find on the shelf. Ketchup. Mustard. Meat flavorings and tenderizers. Bouillon cubes. Hot sauce and Tabasco sauce. Premade or packaged marinades. Premade or packaged taco seasonings. Relishes. Regular salad dressings. Where to find more information:  National Heart, Lung, and [place]: https://wilson-eaton.com/  American Heart Association: www.heart.org Summary  The DASH eating plan is a healthy eating plan that has been shown to reduce high blood pressure (hypertension). It may also reduce your risk for type 2 diabetes, heart disease, and stroke.  With the DASH eating plan, you should limit salt (sodium) intake to  2,300 mg a day. If you have hypertension, you may need to reduce your sodium intake to 1,500 mg a day.  When on the DASH eating plan, aim to eat more fresh fruits and vegetables, whole grains, lean proteins, low-fat dairy, and heart-healthy fats.  Work with your health care provider or diet and nutrition specialist (dietitian) to adjust your eating plan to your individual calorie needs. This information is not intended to replace advice given to  you by your health care provider. Make sure you discuss any questions you have with your health care provider. Document Revised: 02/09/2017 Document Reviewed: 02/21/2016 Elsevier Patient Education  2020 [employer].

## 2020-02-23 NOTE — Telephone Encounter (Signed)
Appointment made for tomorrow

## 2020-02-24 ENCOUNTER — Encounter: Payer: Self-pay | Admitting: Internal Medicine

## 2020-02-24 ENCOUNTER — Ambulatory Visit (INDEPENDENT_AMBULATORY_CARE_PROVIDER_SITE_OTHER): Payer: Medicare PPO | Admitting: Internal Medicine

## 2020-02-24 VITALS — BP 150/88 | HR 91 | Temp 98.8°F | Ht <= 58 in | Wt 208.0 lb

## 2020-02-24 DIAGNOSIS — I10 Essential (primary) hypertension: Secondary | ICD-10-CM | POA: Diagnosis not present

## 2020-02-24 MED ORDER — IRBESARTAN-HYDROCHLOROTHIAZIDE 150-12.5 MG PO TABS: 2.0000 | ORAL_TABLET | Freq: Every day | ORAL | 5 refills | Status: DC

## 2020-02-24 NOTE — Assessment & Plan Note (Addendum)
Chronic Not controlled Increase avalide to 300-25 mg daily Work on weight loss Stressed low sodium diet and regular exercise Bmp next week

## 2020-02-24 NOTE — Assessment & Plan Note (Signed)
Chronic BMI 45.01 Stressed weight loss Advised exercising regularly Discussed possibly seeing a nutritionist - she will check with her insurance co about what that would cost Decrease portions, lean protein, increase fruits and veggies, decrease sugars and carbohydrates

## 2020-02-27 ENCOUNTER — Encounter (HOSPITAL_COMMUNITY): Payer: Self-pay

## 2020-02-27 ENCOUNTER — Telehealth: Payer: Self-pay | Admitting: Internal Medicine

## 2020-02-27 ENCOUNTER — Emergency Department (HOSPITAL_COMMUNITY): Payer: Medicare PPO

## 2020-02-27 ENCOUNTER — Other Ambulatory Visit: Payer: Self-pay

## 2020-02-27 ENCOUNTER — Emergency Department (HOSPITAL_COMMUNITY)
Admission: EM | Admit: 2020-02-27 | Discharge: 2020-02-27 | Disposition: A | Discharge status: Home / Self Care | Payer: Medicare PPO | Attending: Emergency Medicine | Admitting: Emergency Medicine

## 2020-02-27 DIAGNOSIS — R42 Dizziness and giddiness: Secondary | ICD-10-CM | POA: Diagnosis not present

## 2020-02-27 DIAGNOSIS — R519 Headache, unspecified: Secondary | ICD-10-CM | POA: Diagnosis not present

## 2020-02-27 DIAGNOSIS — I160 Hypertensive urgency: Secondary | ICD-10-CM

## 2020-02-27 DIAGNOSIS — I10 Essential (primary) hypertension: Secondary | ICD-10-CM | POA: Insufficient documentation

## 2020-02-27 DIAGNOSIS — Z79899 Other long term (current) drug therapy: Secondary | ICD-10-CM | POA: Insufficient documentation

## 2020-02-27 LAB — COMPREHENSIVE METABOLIC PANEL
ALT: 14 U/L (ref 0–44)
AST: 27 U/L (ref 15–41)
Albumin: 4.3 g/dL (ref 3.5–5.0)
Alkaline Phosphatase: 83 U/L (ref 38–126)
Anion gap: 12 (ref 5–15)
BUN: 17 mg/dL (ref 8–23)
CO2: 26 mmol/L (ref 22–32)
Calcium: 9.4 mg/dL (ref 8.9–10.3)
Chloride: 104 mmol/L (ref 98–111)
Creatinine, Ser: 1.02 mg/dL — ABNORMAL HIGH (ref 0.44–1.00)
GFR, Estimated: 58 mL/min — ABNORMAL LOW (ref >60)
Glucose, Bld: 94 mg/dL (ref 70–99)
Potassium: 3.7 mmol/L (ref 3.5–5.1)
Sodium: 142 mmol/L (ref 135–145)
Total Bilirubin: 0.1 mg/dL — ABNORMAL LOW (ref 0.3–1.2)
Total Protein: 8.7 g/dL — ABNORMAL HIGH (ref 6.5–8.1)

## 2020-02-27 LAB — CBC WITH DIFFERENTIAL/PLATELET
Abs Immature Granulocytes: 0.03 10*3/uL (ref 0.00–0.07)
Basophils Absolute: 0 10*3/uL (ref 0.0–0.1)
Basophils Relative: 0 %
Eosinophils Absolute: 0.2 10*3/uL (ref 0.0–0.5)
Eosinophils Relative: 2 %
HCT: 45.7 % (ref 36.0–46.0)
Hemoglobin: 14.2 g/dL (ref 12.0–15.0)
Immature Granulocytes: 0 %
Lymphocytes Relative: 25 %
Lymphs Abs: 2.2 10*3/uL (ref 0.7–4.0)
MCH: 27.9 pg (ref 26.0–34.0)
MCHC: 31.1 g/dL (ref 30.0–36.0)
MCV: 89.8 fL (ref 80.0–100.0)
Monocytes Absolute: 0.8 10*3/uL (ref 0.1–1.0)
Monocytes Relative: 9 %
Neutro Abs: 5.5 10*3/uL (ref 1.7–7.7)
Neutrophils Relative %: 64 %
Platelets: 334 10*3/uL (ref 150–400)
RBC: 5.09 MIL/uL (ref 3.87–5.11)
RDW: 15.4 % (ref 11.5–15.5)
WBC: 8.7 10*3/uL (ref 4.0–10.5)
nRBC: 0 % (ref 0.0–0.2)

## 2020-02-27 LAB — TSH: TSH: 1.69 u[IU]/mL (ref 0.350–4.500)

## 2020-02-27 MED ORDER — AMLODIPINE BESYLATE 5 MG PO TABS
5.0000 mg | ORAL_TABLET | Freq: Once | ORAL | Status: AC
  Administered 2020-02-27: 23:00:00 5 mg via ORAL
  Filled 2020-02-27: qty 1

## 2020-02-27 MED ORDER — AMLODIPINE BESYLATE 5 MG PO TABS: 5.0000 mg | ORAL_TABLET | Freq: Every day | ORAL | 0 refills | Status: DC

## 2020-02-27 NOTE — Telephone Encounter (Signed)
Patient said left side of head has been hurting really bad  Slurred speech one time  Patient wants a referral to Neurology  Headache and slurred speech occurred today  Also, sending the patient to [facility] for an assessment

## 2020-02-27 NOTE — Telephone Encounter (Signed)
Concern for stroke with headache and slurred speech - should be evaluated in ED

## 2020-02-27 NOTE — Telephone Encounter (Signed)
Spoke with patient today. Advised her to go to nearest ED for concerns of possible stroke with her symptoms per Dr. Quay Burow.  Patient agreed to plan but did not want to go to any place in [place] and preferred to come in. Patient has daughter who will accompany her but daughter does not drive. Patient wanted to go to [facility] so charge nurse called. Advised patient needed to to [facility] with symptoms as they were more equipped to handel her and if she came there they would transport her to [facility]. Tried to reach patient quite a few times but her voicemail is full and I can't reach her so I sent her a my-chart message to go to [facility].

## 2020-02-27 NOTE — Discharge Instructions (Signed)
1.  Continue your Avalide as prescribed.  You have had a recent dose change and need to monitor your blood pressures regularly.  Your current dose of Avalide is at the typical, maximum recommended dose.  You are still experiencing systolic blood pressures in the 160s to 170s.  Today, we discussed the addition of amlodipine back to your regimen.  You have tolerated amlodipine well in the past without side effects.  Many people require more than one blood pressure medication to adequately control their blood pressure. 2.  Keep a log of your blood pressures.  Check your blood pressures 2-3 times daily for the next several weeks.  If your blood pressures are trending below 120s over 70s, discontinue the amlodipine. 3.  Your doctor will need to see a journal of your blood pressures to determine your ongoing dosing of blood pressure medications. 4.  If you continue to have difficulty with your blood pressure despite taking medications regularly and following dietary recommendations, or there are changes in kidney function, you may need to see a nephrologist for help and blood pressure management. 5.  You had a CT scan of your head today.  There was no findings of any recent stroke or bleeding.  If you are persisting and having headaches despite blood pressure management or other concerning symptoms, you may need referral for an MRI and neurology consultation for persistent headache. Return to the emergency department for any worsening concerning or changing symptoms

## 2020-02-27 NOTE — Telephone Encounter (Signed)
[facility] advised: See PCP within 24 Hours   Called and left VM to schedule an appt.

## 2020-02-27 NOTE — ED Notes (Signed)
Pt in bed, vss, call light in reach

## 2020-02-27 NOTE — ED Provider Notes (Signed)
[facility] DEPT Provider Note   CSN: [id number] Arrival date & time: 02/27/20  1625     History Chief Complaint  Patient presents with  . Headache  . Hypertension  . slurred vision    Shelley Hayes is a 74 y.o. female.  HPI Patient reports her blood pressure has been very difficult to control for about a month.  She reports her irbesartan dose was doubled 3 days ago but her blood pressure still remain elevated.  Typically at home she has been measuring systolic pressures in the 170s up to 180s.  Patient reports he has been getting a left-sided headache.  She describes it as a pressure sensation.  She reports it becomes more intense when her blood pressure is more elevated.  She denies she is getting any blurred vision, double vision or decreased vision.  No focal weakness numbness or tingling.  Patient does note for months however that she sometimes gets vertiginous symptoms and when she walks down to her mailbox might stagger slightly to the left or to the right.  This does not happen consistently.  Reports a year ago she got diagnosed with vertigo but did not have an MRI done at that time.  This symptom waxes and wanes.  She also describes problems getting pain in her left ear.  She takes Sudafed sometimes up to 4 times a day because she seems congested in the ear and that helps.  She also has a knot on the left scalp from what she feels like the symptoms emanate sometimes.  However, she has had said knot since she was 74 years old. Patient does not get any vomiting.  No nausea.  He denies fever chills sore throat body aches or cough.  She does not experience chest pain or shortness of breath.  She does not experience swelling of her legs.  She reports aside from this waxing and waning symptom of the left-sided headache and elevated blood pressure, she feels well.    Past Medical History:  Diagnosis Date  . ALLERGIC RHINITIS   . Allergic rhinitis 08/23/2009   . Allergy   . Anxiety   . ARTHRITIS   . Arthritis   . Blood in stool 04/24/2017  . Cataract    bil cataracts removes  . Cervical paraspinal muscle spasm 12/23/2015  . Chest pain 04/24/2017  . CHEST PAIN 12/19/2009   Qualifier: Diagnosis of  By: Marca Ancona RMA, Lucy    . COLONIC POLYPS, HX OF 08/23/2009   Qualifier: Diagnosis of  By: Marca Ancona RMA, Lucy    . Cough 06/19/2016  . Fluttering heart 12/23/2015  . GERD (gastroesophageal reflux disease)   . Glaucoma   . GLUCOMA   . Heart murmur   . Herpes zoster without complication 9/93/7169  . Hyperglycemia 06/19/2016  . HYPERLIPIDEMIA   . HYPERTENSION   . KNEE PAIN 08/23/2009   Qualifier: Diagnosis of  By: Asa Lente MD, Jannifer Rodney Muscle cramps 06/19/2016  . Osteopenia 04/26/2016   2/18 - dexa - RFN and LFN  -1.2  . OTITIS EXTERNA, RIGHT 01/20/2010   Qualifier: Diagnosis of  By: Asa Lente MD, Jannifer Rodney Prediabetes 04/24/2017  . SCIATICA, LEFT 11/2010    Patient Active Problem List   Diagnosis Date Noted  . Morbid obesity ([place]) 02/24/2020  . BPPV (benign paroxysmal positional vertigo) 11/30/2017  . Dysphagia 07/26/2017  . Incomplete emptying of bladder 07/25/2017  . Chest pain 04/24/2017  . Prediabetes 04/24/2017  .  Herpes zoster without complication 62/56/3893  . Anxiety 04/24/2017  . Muscle cramps 06/19/2016  . Osteopenia 04/26/2016  . Fluttering heart 12/23/2015  . Cervical paraspinal muscle spasm 12/23/2015  . SCIATICA, LEFT 01/20/2010  . Hyperlipidemia 08/23/2009  . GLUCOMA 08/23/2009  . Essential hypertension 08/23/2009  . Allergic rhinitis 08/23/2009  . ARTHRITIS 08/23/2009  . KNEE PAIN 08/23/2009    Past Surgical History:  Procedure Laterality Date  . BREAST BIOPSY Right    benign  . COLONOSCOPY    . ESOPHAGOGASTRODUODENOSCOPY    . POLYPECTOMY    . TUBAL LIGATION    . UPPER GASTROINTESTINAL ENDOSCOPY       OB History   No obstetric history on file.     Family History  Problem Relation Age of Onset  .  Arthritis Mother   . Diabetes Mother   . Colon cancer Mother 22  . Colon polyps Mother   . Arthritis Father   . Alcohol abuse Brother   . Lung cancer Brother   . Prostate cancer Brother   . Diabetes Brother   . Hypertension Brother   . Lung cancer Other        Neice  . Esophageal cancer Neg Hx   . Liver cancer Neg Hx   . Pancreatic cancer Neg Hx   . Rectal cancer Neg Hx   . Stomach cancer Neg Hx     Social History   Tobacco Use  . Smoking status: Never Smoker  . Smokeless tobacco: Never Used  Vaping Use  . Vaping Use: Never used  Substance Use Topics  . Alcohol use: No    Alcohol/week: 0.0 standard drinks  . Drug use: No    Home Medications Prior to Admission medications   Medication Sig Start Date End Date Taking? Authorizing Provider  albuterol (VENTOLIN HFA) 108 (90 Base) MCG/ACT inhaler Inhale 2 puffs into the lungs every 6 (six) hours as needed for wheezing or shortness of breath. 12/22/19   Binnie Rail, MD  Alcohol Swabs (B-D SINGLE USE SWABS REGULAR) PADS USE AS DIRECTED 05/09/19   Burns, Claudina Lick, MD  ALPRAZolam Duanne Moron) 0.5 MG tablet Take 1 tablet (0.5 mg total) by mouth 2 (two) times daily as needed for anxiety or sleep. 12/22/19   Binnie Rail, MD  amLODipine (NORVASC) 5 MG tablet Take 1 tablet (5 mg total) by mouth daily. 02/27/20   Charlesetta Shanks, MD  Ascorbic Acid (VITAMIN C) 1000 MG tablet Take 2,000 mg by mouth daily.    [provider]  aspirin 81 MG tablet Take 81 mg by mouth daily.    [provider]  atorvastatin (LIPITOR) 40 MG tablet TAKE 1 TABLET EVERY DAY 02/10/19   Binnie Rail, MD  blood glucose meter kit and supplies KIT Dispense based on patient and insurance preference. Use up to four times daily as directed. R73.03. 04/04/19   Binnie Rail, MD  Cholecalciferol (VITAMIN D3) 1000 UNITS CAPS Take 1 capsule (1,000 Units total) by mouth daily. 02/23/14   Rowe Clack, MD  irbesartan-hydrochlorothiazide (AVALIDE)  150-12.5 MG tablet Take 2 tablets by mouth daily. 02/24/20   Burns, Claudina Lick, MD  latanoprost (XALATAN) 0.005 % ophthalmic solution PLACE 1 DROP INTO BOTH EYES AT BEDTIME. 08/06/18   Binnie Rail, MD  Multiple Vitamin (MULTIVITAMIN) tablet Take 1 tablet by mouth daily.    [provider]  potassium chloride SA (KLOR-CON) 20 MEQ tablet Take 1 tablet (20 mEq total) by mouth daily. 12/26/19  Binnie Rail, MD  vitamin B-12 (CYANOCOBALAMIN) 100 MCG tablet Vitamin B12    [provider]  vitamin B-12 (CYANOCOBALAMIN) 1000 MCG tablet Take 1 tablet (1,000 mcg total) by mouth daily. 02/23/14   Rowe Clack, MD    Allergies    Hydrocodone-homatropine and Losartan  Review of Systems   Review of Systems 10 systems reviewed and negative except as per HPI Physical Exam Updated Vital Signs BP (!) 145/70   Pulse (!) 58   Temp 99 F (37.2 C) (Oral)   Resp (!) 23   Ht 4' 9" (1.448 m)   Wt 93.4 kg   SpO2 99%   BMI 44.58 kg/m   Physical Exam Constitutional:      Appearance: She is well-developed and well-nourished.  HENT:     Head: Normocephalic and atraumatic.     Comments: There is a firm approximately 1 cm, smooth margined nodule in the scalp over the left temporoparietal area.  This apparently is very longstanding.  It is mobile.  No overlying skin changes.    Left Ear: Tympanic membrane normal.     Mouth/Throat:     Mouth: Mucous membranes are moist.     Pharynx: Oropharynx is clear.  Eyes:     Extraocular Movements: Extraocular movements intact and EOM normal.     Pupils: Pupils are equal, round, and reactive to light.  Cardiovascular:     Rate and Rhythm: Normal rate and regular rhythm.     Pulses: Intact distal pulses.     Heart sounds: Normal heart sounds.  Pulmonary:     Effort: Pulmonary effort is normal.     Breath sounds: Normal breath sounds.  Abdominal:     General: Bowel sounds are normal. There is no distension.     Palpations: Abdomen is  soft.     Tenderness: There is no abdominal tenderness.  Musculoskeletal:        General: No edema. Normal range of motion.     Cervical back: Neck supple.  Skin:    General: Skin is warm, dry and intact.  Neurological:     General: No focal deficit present.     Mental Status: She is alert and oriented to person, place, and time.     GCS: GCS eye subscore is 4. GCS verbal subscore is 5. GCS motor subscore is 6.     Cranial Nerves: No cranial nerve deficit.     Sensory: No sensory deficit.     Motor: No weakness.     Coordination: Coordination normal.     Deep Tendon Reflexes: Strength normal.     Comments: Normal finger-nose exam bilaterally.  Cognitive function normal.  Speech is clear.  No focal motor deficits.    Psychiatric:        Mood and Affect: Mood and affect normal.     ED Results / Procedures / Treatments   Labs (all labs ordered are listed, but only abnormal results are displayed) Labs Reviewed  COMPREHENSIVE METABOLIC PANEL - Abnormal; Notable for the following components:      Result Value   Creatinine, Ser 1.02 (*)    Total Protein 8.7 (*)    Total Bilirubin 0.1 (*)    GFR, Estimated 58 (*)    All other components within normal limits  CBC WITH DIFFERENTIAL/PLATELET  TSH    EKG None  Radiology CT Head Wo Contrast  Result Date: 02/27/2020 CLINICAL DATA:  Nonspecific dizziness. Headaches and hypertension. EXAM: CT HEAD WITHOUT  CONTRAST TECHNIQUE: Contiguous axial images were obtained from the base of the skull through the vertex without intravenous contrast. COMPARISON:  None. FINDINGS: Brain: Brain volume is normal for age. Mild periventricular and deep white matter hypodensity consistent with chronic small vessel ischemia. Small remote lacunar infarcts in the left basal ganglia. No intracranial hemorrhage, mass effect, or midline shift. No hydrocephalus. The basilar cisterns are patent. No evidence of territorial infarct or acute ischemia. No extra-axial or  intracranial fluid collection. Vascular: No hyperdense vessel. Skull: No fracture or focal lesion. Sinuses/Orbits: Paranasal sinuses and mastoid air cells are clear. The visualized orbits are unremarkable. No mastoid effusion. Bilateral cataract resection. Other: Sebaceous cyst left frontal scalp. IMPRESSION: 1. No acute intracranial abnormality. 2. Mild chronic small vessel ischemia. Small remote lacunar infarcts in the left basal ganglia. Electronically Signed   By: Keith Rake M.D.   On: 02/27/2020 19:16    Procedures Procedures (including critical care time)  Medications Ordered in ED Medications  amLODipine (NORVASC) tablet 5 mg (has no administration in time range)    ED Course  I have reviewed the triage vital signs and the nursing notes.  Pertinent labs & imaging results that were available during my care of the patient were reviewed by me and considered in my medical decision making (see chart for details).    MDM Rules/Calculators/A&P                          Patient presented with elevated blood pressure 160/70s.  She reports that she has had difficulty managing her blood pressure despite being compliant with her medications.  She reports and her blood pressure is over 160s or 170 she has a more intense left-sided pressure headache.  She denies that the pain are sharp.  This has been persistent for a long time waxing and waning in quality.  No associated visual changes.  CT does not show any acute findings.  Patient's mental status and neurologic exam is normal.  She did have a recent dose increase at the beginning of the week and her Avalide.  She has been monitoring her blood pressure but has not had any change in blood pressures per her report.  After diagnostic evaluation at this time patient is stable for continued outpatient management.  She reported being on amlodipine previous to the Avalide.  She reports she tolerated it well and did not have any adverse effects.  She  reports she was switched to Avalide because her blood  pressure was not controlled on the amlodipine.  At this time, with patient on maximum Avalide dose, will have her resume amlodipine at 5 mg and carefully monitor blood pressures at home.  Patient counseled on the nature of hypertension with people sometimes requiring more than one agent and typical practice of maximizing one agent and adding a second agent with careful monitoring of kidney function and response to treatment.  Patient is aware of the plan and need for close follow-up and monitoring. Final Clinical Impression(s) / ED Diagnoses Final diagnoses:  Hypertensive urgency  Bad headache    Rx / DC Orders ED Discharge Orders         Ordered    amLODipine (NORVASC) 5 MG tablet  Daily        02/27/20 2214           Charlesetta Shanks, MD 02/27/20 2232

## 2020-02-27 NOTE — ED Triage Notes (Addendum)
Patient states she saw her PCP 3 days ago and was seen for hypertension and headaches. Today, the patient states she had an episode where she could not get the words out and happened only once.( around 1530 today) Patient does not have any weakness of extremities, smile symmetrical, no drifting of extremities.

## 2020-03-01 ENCOUNTER — Encounter: Payer: Self-pay | Admitting: Gastroenterology

## 2020-03-01 NOTE — Telephone Encounter (Signed)
Patient wondering if she still needs to go get her blood work tomorrow since she was just seen in the emergency room.  [phone]

## 2020-03-01 NOTE — Telephone Encounter (Signed)
Patient went to ED on Friday. Was seen and treated.

## 2020-03-11 DIAGNOSIS — H9209 Otalgia, unspecified ear: Secondary | ICD-10-CM | POA: Diagnosis not present

## 2020-03-11 DIAGNOSIS — M1990 Unspecified osteoarthritis, unspecified site: Secondary | ICD-10-CM | POA: Diagnosis not present

## 2020-03-11 DIAGNOSIS — M26622 Arthralgia of left temporomandibular joint: Secondary | ICD-10-CM | POA: Diagnosis not present

## 2020-03-11 DIAGNOSIS — M26642 Arthritis of left temporomandibular joint: Secondary | ICD-10-CM | POA: Diagnosis not present

## 2020-03-15 ENCOUNTER — Ambulatory Visit (INDEPENDENT_AMBULATORY_CARE_PROVIDER_SITE_OTHER): Payer: Medicare PPO

## 2020-03-15 DIAGNOSIS — Z0000 Encounter for general adult medical examination without abnormal findings: Secondary | ICD-10-CM

## 2020-03-15 NOTE — Progress Notes (Signed)
I connected with Shelley Hayes today by telephone and verified that I am speaking with the correct person using two identifiers. Location patient: home Location provider: work Persons participating in the virtual visit: Shelley Hayes and Shelley Abu, LPN.   I discussed the limitations, risks, security and privacy concerns of performing an evaluation and management service by telephone and the availability of in person appointments. I also discussed with the patient that there may be a patient responsible charge related to this service. The patient expressed understanding and verbally consented to this telephonic visit.    Interactive audio and video telecommunications were attempted between this provider and patient, however failed, due to patient having technical difficulties OR patient did not have access to video capability.  We continued and completed visit with audio only.  Some vital signs may be absent or patient reported.   Time Spent with patient on telephone encounter: 30 minutes  Subjective:   Shelley Hayes is a 75 y.o. female who presents for Medicare Annual (Subsequent) preventive examination.  Review of Systems    No ROS. Medicare Wellness Visit. Additional risk factors are reflected in social history. Cardiac Risk Factors include: advanced age (>52men, >8 women);dyslipidemia;hypertension     Objective:    Today's Vitals   03/15/20 1234  PainSc: 0-No pain   There is no height or weight on file to calculate BMI.  Advanced Directives 03/15/2020 02/27/2020 07/25/2017 02/29/2016  Does Patient Have a Medical Advance Directive? Yes Yes Yes Yes  Type of Paramedic of [place];Living will [place];Living will [place];Living will Living will;Healthcare Power of Attorney  Does patient want to make changes to medical advance directive? No - Patient declined - - No - Patient declined  Copy of [place] in Chart? No - copy requested - No - copy requested No - copy requested  Would patient like information on creating a medical advance directive? - No - Patient declined - -    Current Medications (verified) Outpatient Encounter Medications as of 03/15/2020  Medication Sig  . albuterol (VENTOLIN HFA) 108 (90 Base) MCG/ACT inhaler Inhale 2 puffs into the lungs every 6 (six) hours as needed for wheezing or shortness of breath.  . Alcohol Swabs (B-D SINGLE USE SWABS REGULAR) PADS USE AS DIRECTED  . ALPRAZolam (XANAX) 0.5 MG tablet Take 1 tablet (0.5 mg total) by mouth 2 (two) times daily as needed for anxiety or sleep.  Marland Kitchen amLODipine (NORVASC) 5 MG tablet Take 1 tablet (5 mg total) by mouth daily.  . Ascorbic Acid (VITAMIN C) 1000 MG tablet Take 2,000 mg by mouth daily.  Marland Kitchen aspirin 81 MG tablet Take 81 mg by mouth daily.  Marland Kitchen atorvastatin (LIPITOR) 40 MG tablet TAKE 1 TABLET EVERY DAY  . blood glucose meter kit and supplies KIT Dispense based on patient and insurance preference. Use up to four times daily as directed. R73.03.  Marland Kitchen Cholecalciferol (VITAMIN D3) 1000 UNITS CAPS Take 1 capsule (1,000 Units total) by mouth daily.  . irbesartan-hydrochlorothiazide (AVALIDE) 150-12.5 MG tablet Take 2 tablets by mouth daily.  Marland Kitchen latanoprost (XALATAN) 0.005 % ophthalmic solution PLACE 1 DROP INTO BOTH EYES AT BEDTIME.  . Multiple Vitamin (MULTIVITAMIN) tablet Take 1 tablet by mouth daily.  . potassium chloride SA (KLOR-CON) 20 MEQ tablet Take 1 tablet (20 mEq total) by mouth daily.  . vitamin B-12 (CYANOCOBALAMIN) 100 MCG tablet Vitamin B12  . vitamin B-12 (CYANOCOBALAMIN) 1000 MCG tablet Take  1 tablet (1,000 mcg total) by mouth daily.   No facility-administered encounter medications on file as of 03/15/2020.    Allergies (verified) Hydrocodone-homatropine and Losartan   History: Past Medical History:  Diagnosis Date  . ALLERGIC RHINITIS   . Allergic rhinitis 08/23/2009  . Allergy   . Anxiety   .  ARTHRITIS   . Arthritis   . Blood in stool 04/24/2017  . Cataract    bil cataracts removes  . Cervical paraspinal muscle spasm 12/23/2015  . Chest pain 04/24/2017  . CHEST PAIN 12/19/2009   Qualifier: Diagnosis of  By: Marca Ancona RMA, Lucy    . COLONIC POLYPS, HX OF 08/23/2009   Qualifier: Diagnosis of  By: Marca Ancona RMA, Lucy    . Cough 06/19/2016  . Fluttering heart 12/23/2015  . GERD (gastroesophageal reflux disease)   . Glaucoma   . GLUCOMA   . Heart murmur   . Herpes zoster without complication 3/53/2992  . Hyperglycemia 06/19/2016  . HYPERLIPIDEMIA   . HYPERTENSION   . KNEE PAIN 08/23/2009   Qualifier: Diagnosis of  By: Asa Lente MD, Jannifer Rodney Muscle cramps 06/19/2016  . Osteopenia 04/26/2016   2/18 - dexa - RFN and LFN  -1.2  . OTITIS EXTERNA, RIGHT 01/20/2010   Qualifier: Diagnosis of  By: Asa Lente MD, Jannifer Rodney Prediabetes 04/24/2017  . SCIATICA, LEFT 11/2010   Past Surgical History:  Procedure Laterality Date  . BREAST BIOPSY Right    benign  . COLONOSCOPY    . ESOPHAGOGASTRODUODENOSCOPY    . POLYPECTOMY    . TUBAL LIGATION    . UPPER GASTROINTESTINAL ENDOSCOPY     Family History  Problem Relation Age of Onset  . Arthritis Mother   . Diabetes Mother   . Colon cancer Mother 36  . Colon polyps Mother   . Arthritis Father   . Alcohol abuse Brother   . Lung cancer Brother   . Prostate cancer Brother   . Diabetes Brother   . Hypertension Brother   . Lung cancer Other        Neice  . Esophageal cancer Neg Hx   . Liver cancer Neg Hx   . Pancreatic cancer Neg Hx   . Rectal cancer Neg Hx   . Stomach cancer Neg Hx    Social History   Socioeconomic History  . Marital status: Divorced    Spouse name: Not on file  . Number of children: 2  . Years of education: Not on file  . Highest education level: Not on file  Occupational History  . Occupation: Retired  Tobacco Use  . Smoking status: Never Smoker  . Smokeless tobacco: Never Used  Vaping Use  . Vaping Use:  Never used  Substance and Sexual Activity  . Alcohol use: No    Alcohol/week: 0.0 standard drinks  . Drug use: No  . Sexual activity: Not Currently  Other Topics Concern  . Not on file  Social History Narrative   Divorced, lives alone- in [place] for 88yrs but moved "home" to [place] in 2008      Walking regularly, every morning   Social Determinants of Health   Financial Resource Strain: Low Risk   . Difficulty of Paying Living Expenses: Not hard at all  Food Insecurity: No Food Insecurity  . Worried About Charity fundraiser in the Last Year: Never true  . Ran Out of Food in the Last Year: Never true  Transportation Needs: No Transportation  Needs  . Lack of Transportation (Medical): No  . Lack of Transportation (Non-Medical): No  Physical Activity: Inactive  . Days of Exercise per Week: 0 days  . Minutes of Exercise per Session: 0 min  Stress: No Stress Concern Present  . Feeling of Stress : Not at all  Social Connections: Not on file    Tobacco Counseling Counseling given: Not Answered   Clinical Intake:  Pre-visit preparation completed: Yes  Pain : No/denies pain Pain Score: 0-No pain     Nutritional Risks: None Diabetes: No  How often do you need to have someone help you when you read instructions, pamphlets, or other written materials from your doctor or pharmacy?: 1 - Never What is the last grade level you completed in school?: High School Graduate; 2 years of college  Diabetic? no  Interpreter Needed?: No  Information entered by :: Cyndee Giammarco N. Kaylani Fromme, LPN   Activities of Daily Living In your present state of health, do you have any difficulty performing the following activities: 03/15/2020 12/22/2019  Hearing? N N  Vision? N N  Difficulty concentrating or making decisions? N N  Walking or climbing stairs? N N  Dressing or bathing? N N  Doing errands, shopping? N N  Preparing Food and eating ? N -  Using the Toilet? N -  In the past six months,  have you accidently leaked urine? N -  Do you have problems with loss of bowel control? N -  Managing your Medications? N -  Managing your Finances? N -  Housekeeping or managing your Housekeeping? N -  Some recent data might be hidden    Patient Care Team: Pincus Sanes, MD as PCP - General (Internal Medicine) Wendall Stade, MD as PCP - Cardiology (Cardiology) Harold Hedge, MD (Obstetrics and Gynecology) Velda Shell, MD as Referring Physician (Optometry) Kathyrn Sheriff, [facility] as Pharmacist (Pharmacist)  Indicate any recent Medical Services you may have received from other than [facility] providers in the past year (date may be approximate).     Assessment:   This is a routine wellness examination for [place].  Hearing/Vision screen No exam data present  Dietary issues and exercise activities discussed: Current Exercise Habits: The patient does not participate in regular exercise at present, Exercise limited by: None identified  Goals    .  Patient Stated      I want to make life changes to be healthier. Monitor what I eat for decreasing sugar, carbohydrates and fat. Continue to drink a lot water and increase physical activity by starting to walk again and do chair exercises.     .  Patient Stated (pt-stated)      I would like to lose weight and change my eating habits.    .  Pharmacy Care Plan      CARE PLAN ENTRY (see longitudinal plan of care for additional care plan information)  Current Barriers:  . Chronic Disease Management support, education, and care coordination needs related to Hypertension and Hyperlipidemia   Hypertension BP Readings from Last 3 Encounters:  12/16/18 (!) 148/82  04/10/18 (!) 142/88  11/30/17 132/80 .  Pharmacist Clinical Goal(s): o Over the next 180 days, patient will work with PharmD and providers to maintain BP goal <130/80 . Current regimen:  o amlodipine 10 mg daily o HCTZ 12.5 mg daily . Interventions: o Discussed BP goals and  benefits of medications for prevention of heart attack / stroke . Patient self care activities - Over  the next 180 days, patient will: o Check BP daily, document, and provide at future appointments o Ensure daily salt intake < 2300 mg/day  Hyperlipidemia Lab Results  Component Value Date/Time   LDLCALC 109 (H) 12/16/2018 11:35 AM   LDLDIRECT 198.9 12/30/2012 10:37 AM .  Pharmacist Clinical Goal(s): o Over the next 180 days, patient will work with PharmD and providers to maintain LDL goal < 130 . Current regimen:  o Atorvastatin 40 mg daily . Interventions: o Discussed cholesterol goals and benefits of medications for prevention of heart attack / stroke . Patient self care activities - Over the next 180 days, patient will: o Continue medication as prescribed o Continue low cholesterol diet  Medication management . Pharmacist Clinical Goal(s): o Over the next 180 days, patient will work with PharmD and providers to maintain optimal medication adherence . Current pharmacy: [facility] . Interventions o Comprehensive medication review performed. o Continue current medication management strategy . Patient self care activities - Over the next 180 days, patient will: o Focus on medication adherence by pill box o Take medications as prescribed o Report any questions or concerns to PharmD and/or provider(s)  Please see past updates related to this goal by clicking on the "Past Updates" button in the selected goal     .  Weight (lb) < 175 lb (79.4 kg)      Would like to lose 20 lbs by increasing activity (joining [employer]) and continuing health food choices.       Depression Screen PHQ 2/9 Scores 03/15/2020 12/22/2019 12/22/2019 12/16/2018 07/25/2017 04/24/2017 02/29/2016  PHQ - 2 Score 0 0 0 0 1 0 0  PHQ- 9 Score - - - - 3 - -    Fall Risk Fall Risk  03/15/2020 12/22/2019 12/16/2018 07/25/2017 04/24/2017  Falls in the past year? 0 0 0 No No  Number falls in past yr: 0 0 0 - -   Injury with Fall? 0 0 - - -  Risk for fall due to : No Fall Risks No Fall Risks - - -  Follow up Falls evaluation completed Falls evaluation completed - - -    FALL RISK PREVENTION PERTAINING TO THE HOME:  Any stairs in or around the home? No  If so, are there any without handrails? No  Home free of loose throw rugs in walkways, pet beds, electrical cords, etc? Yes  Adequate lighting in your home to reduce risk of falls? Yes   ASSISTIVE DEVICES UTILIZED TO PREVENT FALLS:  Life alert? No  Use of a cane, walker or w/c? No  Grab bars in the bathroom? No  Shower chair or bench in shower? No  Elevated toilet seat or a handicapped toilet? No   TIMED UP AND GO:  Was the test performed? No .  Length of time to ambulate 10 feet: 0 sec.   Gait steady and fast without use of assistive device  Cognitive Function: No flowsheet data found.        Immunizations Immunization History  Administered Date(s) Administered  . Fluad Quad(high Dose 65+) 12/16/2018, 12/23/2019  . Influenza Whole 01/20/2010  . Influenza, High Dose Seasonal PF 12/30/2012, 02/26/2015, 02/29/2016, 02/06/2017, 03/27/2018  . Influenza,inj,Quad PF,6+ Mos 02/23/2014  . Janssen (J&J) SARS-COV-2 Vaccination 06/13/2019  . Pneumococcal Conjugate-13 02/29/2016  . Pneumococcal Polysaccharide-23 02/23/2014  . Tetanus 10/25/2011    TDAP status: Due, Education has been provided regarding the importance of this vaccine. Advised may receive this vaccine at local pharmacy or  Health Dept. Aware to provide a copy of the vaccination record if obtained from local pharmacy or Health Dept. Verbalized acceptance and understanding.  Flu Vaccine status: Up to date  Pneumococcal vaccine status: Up to date  Covid-19 vaccine status: Completed vaccines  Qualifies for Shingles Vaccine? Yes   Zostavax completed No   Shingrix Completed?: No.    Education has been provided regarding the importance of this vaccine. Patient has been  advised to call insurance company to determine out of pocket expense if they have not yet received this vaccine. Advised may also receive vaccine at local pharmacy or Health Dept. Verbalized acceptance and understanding.  Screening Tests Health Maintenance  Topic Date Due  . COVID-19 Vaccine (2 - Booster for [employer] series) 08/08/2019  . COLONOSCOPY (Pts 45-66yrs Insurance coverage will need to be confirmed)  02/12/2021  . TETANUS/TDAP  10/24/2021  . MAMMOGRAM  02/18/2022  . DEXA SCAN  12/29/2022  . INFLUENZA VACCINE  Completed  . Hepatitis C Screening  Completed  . PNA vac Low Risk Adult  Completed    Health Maintenance  Health Maintenance Due  Topic Date Due  . COVID-19 Vaccine (2 - Booster for Janssen series) 08/08/2019    Colorectal cancer screening: Type of screening: Colonoscopy. Completed 02/13/2020. Repeat every 1 years  Mammogram status: Completed 02/19/2020. Repeat every year  Bone Density status: Completed 12/29/2019. Results reflect: Bone density results: OSTEOPENIA. Repeat every 3 years.  Lung Cancer Screening: (Low Dose CT Chest recommended if Age 53-80 years, 30 pack-year currently smoking OR have quit w/in 15years.) does not qualify.   Lung Cancer Screening Referral: no  Additional Screening:  Hepatitis C Screening: does qualify; Completed yes  Vision Screening: Recommended annual ophthalmology exams for early detection of glaucoma and other disorders of the eye. Is the patient up to date with their annual eye exam?  Yes  Who is the provider or what is the name of the office in which the patient attends annual eye exams? Myra Rude, [place]. If pt is not established with a provider, would they like to be referred to a provider to establish care? No .   Dental Screening: Recommended annual dental exams for proper oral hygiene  Community Resource Referral / Chronic Care Management: CRR required this visit?  No   CCM required this visit?  No      Plan:     I  have personally reviewed and noted the following in the patient's chart:   . Medical and social history . Use of alcohol, tobacco or illicit drugs  . Current medications and supplements . Functional ability and status . Nutritional status . Physical activity . Advanced directives . List of other physicians . Hospitalizations, surgeries, and ER visits in previous 12 months . Vitals . Screenings to include cognitive, depression, and falls . Referrals and appointments  In addition, I have reviewed and discussed with patient certain preventive protocols, quality metrics, and best practice recommendations. A written personalized care plan for preventive services as well as general preventive health recommendations were provided to patient.     Sheral Flow, LPN   03/15/2438   Nurse Notes:  Patient has been advised to check blood pressure 1-3 times a day, to record time and if having any side effects she may be experiencing during that time. Provide blood pressure reading to PCP at next office visit. Patient is cogitatively intact. There were no vitals filed for this visit. There is no height or weight on file to  calculate BMI. Patient stated that she has no issues with gait or balance; does not use any assistive devices.

## 2020-03-15 NOTE — Patient Instructions (Addendum)
Shelley Hayes , Thank you for taking time to come for your Medicare Wellness Visit. I appreciate your ongoing commitment to your health goals. Please review the following plan we discussed and let me know if I can assist you in the future.   Screening recommendations/referrals: Colonoscopy: 02/13/2020; due every 1 year Mammogram: 02/19/2020 per patient Bone Density: 12/29/2019; due every 3 years Recommended yearly ophthalmology/optometry visit for glaucoma screening and checkup Recommended yearly dental visit for hygiene and checkup  Vaccinations: Influenza vaccine: 12/23/2019 Pneumococcal vaccine: up to date Tdap vaccine: never done  Shingles vaccine: never done   Covid-19: up to date  Nurse note: Continue with checking blood pressure.  Please record the time, how its taken and any side effects you may be experiencing during that time.  Please bring report to next appointment with Dr. Quay Burow.  Advanced directives: Please bring a copy of your health care power of attorney and living will to the office at your convenience.  Conditions/risks identified:  Yes; Reviewed health maintenance screenings with patient today and relevant education, vaccines, and/or referrals were provided. Please continue to do your personal lifestyle choices by: daily care of teeth and gums, regular physical activity (goal should be 5 days a week for 30 minutes), eat a healthy diet, avoid tobacco and drug use, limiting any alcohol intake, taking a low-dose aspirin (if not allergic or have been advised by your provider otherwise) and taking vitamins and minerals as recommended by your provider. Continue doing brain stimulating activities (puzzles, reading, adult coloring books, staying active) to keep memory sharp. Continue to eat heart healthy diet (full of fruits, vegetables, whole grains, lean protein, water--limit salt, fat, and sugar intake) and increase physical activity as tolerated.  Next appointment: Please schedule  your next Medicare Wellness Visit with your Nurse Health Advisor in 1 year by calling [phone].   Preventive Care 38 Years and Older, Female Preventive care refers to lifestyle choices and visits with your health care provider that can promote health and wellness. What does preventive care include?  A yearly physical exam. This is also called an annual well check.  Dental exams once or twice a year.  Routine eye exams. Ask your health care provider how often you should have your eyes checked.  Personal lifestyle choices, including:  Daily care of your teeth and gums.  Regular physical activity.  Eating a healthy diet.  Avoiding tobacco and drug use.  Limiting alcohol use.  Practicing safe sex.  Taking low-dose aspirin every day.  Taking vitamin and mineral supplements as recommended by your health care provider. What happens during an annual well check? The services and screenings done by your health care provider during your annual well check will depend on your age, overall health, lifestyle risk factors, and family history of disease. Counseling  Your health care provider may ask you questions about your:  Alcohol use.  Tobacco use.  Drug use.  Emotional well-being.  Home and relationship well-being.  Sexual activity.  Eating habits.  History of falls.  Memory and ability to understand (cognition).  Work and work Statistician.  Reproductive health. Screening  You may have the following tests or measurements:  Height, weight, and BMI.  Blood pressure.  Lipid and cholesterol levels. These may be checked every 5 years, or more frequently if you are over 78 years old.  Skin check.  Lung cancer screening. You may have this screening every year starting at age 63 if you have a 30-pack-year history of smoking and  currently smoke or have quit within the past 15 years.  Fecal occult blood test (FOBT) of the stool. You may have this test every year  starting at age 24.  Flexible sigmoidoscopy or colonoscopy. You may have a sigmoidoscopy every 5 years or a colonoscopy every 10 years starting at age 7.  Hepatitis C blood test.  Hepatitis B blood test.  Sexually transmitted disease (STD) testing.  Diabetes screening. This is done by checking your blood sugar (glucose) after you have not eaten for a while (fasting). You may have this done every 1-3 years.  Bone density scan. This is done to screen for osteoporosis. You may have this done starting at age 20.  Mammogram. This may be done every 1-2 years. Talk to your health care provider about how often you should have regular mammograms. Talk with your health care provider about your test results, treatment options, and if necessary, the need for more tests. Vaccines  Your health care provider may recommend certain vaccines, such as:  Influenza vaccine. This is recommended every year.  Tetanus, diphtheria, and acellular pertussis (Tdap, Td) vaccine. You may need a Td booster every 10 years.  Zoster vaccine. You may need this after age 28.  Pneumococcal 13-valent conjugate (PCV13) vaccine. One dose is recommended after age 7.  Pneumococcal polysaccharide (PPSV23) vaccine. One dose is recommended after age 3. Talk to your health care provider about which screenings and vaccines you need and how often you need them. This information is not intended to replace advice given to you by your health care provider. Make sure you discuss any questions you have with your health care provider. Document Released: 03/26/2015 Document Revised: 11/17/2015 Document Reviewed: 12/29/2014 Elsevier Interactive Patient Education  2017 [employer] Prevention in the Home Falls can cause injuries. They can happen to people of all ages. There are many things you can do to make your home safe and to help prevent falls. What can I do on the outside of my home?  Regularly fix the edges of walkways  and driveways and fix any cracks.  Remove anything that might make you trip as you walk through a door, such as a raised step or threshold.  Trim any bushes or trees on the path to your home.  Use bright outdoor lighting.  Clear any walking paths of anything that might make someone trip, such as rocks or tools.  Regularly check to see if handrails are loose or broken. Make sure that both sides of any steps have handrails.  Any raised decks and porches should have guardrails on the edges.  Have any leaves, snow, or ice cleared regularly.  Use sand or salt on walking paths during winter.  Clean up any spills in your garage right away. This includes oil or grease spills. What can I do in the bathroom?  Use night lights.  Install grab bars by the toilet and in the tub and shower. Do not use towel bars as grab bars.  Use non-skid mats or decals in the tub or shower.  If you need to sit down in the shower, use a plastic, non-slip stool.  Keep the floor dry. Clean up any water that spills on the floor as soon as it happens.  Remove soap buildup in the tub or shower regularly.  Attach bath mats securely with double-sided non-slip rug tape.  Do not have throw rugs and other things on the floor that can make you trip. What can I do  in the bedroom?  Use night lights.  Make sure that you have a light by your bed that is easy to reach.  Do not use any sheets or blankets that are too big for your bed. They should not hang down onto the floor.  Have a firm chair that has side arms. You can use this for support while you get dressed.  Do not have throw rugs and other things on the floor that can make you trip. What can I do in the kitchen?  Clean up any spills right away.  Avoid walking on wet floors.  Keep items that you use a lot in easy-to-reach places.  If you need to reach something above you, use a strong step stool that has a grab bar.  Keep electrical cords out of the  way.  Do not use floor polish or wax that makes floors slippery. If you must use wax, use non-skid floor wax.  Do not have throw rugs and other things on the floor that can make you trip. What can I do with my stairs?  Do not leave any items on the stairs.  Make sure that there are handrails on both sides of the stairs and use them. Fix handrails that are broken or loose. Make sure that handrails are as long as the stairways.  Check any carpeting to make sure that it is firmly attached to the stairs. Fix any carpet that is loose or worn.  Avoid having throw rugs at the top or bottom of the stairs. If you do have throw rugs, attach them to the floor with carpet tape.  Make sure that you have a light switch at the top of the stairs and the bottom of the stairs. If you do not have them, ask someone to add them for you. What else can I do to help prevent falls?  Wear shoes that:  Do not have high heels.  Have rubber bottoms.  Are comfortable and fit you well.  Are closed at the toe. Do not wear sandals.  If you use a stepladder:  Make sure that it is fully opened. Do not climb a closed stepladder.  Make sure that both sides of the stepladder are locked into place.  Ask someone to hold it for you, if possible.  Clearly mark and make sure that you can see:  Any grab bars or handrails.  First and last steps.  Where the edge of each step is.  Use tools that help you move around (mobility aids) if they are needed. These include:  Canes.  Walkers.  Scooters.  Crutches.  Turn on the lights when you go into a dark area. Replace any light bulbs as soon as they burn out.  Set up your furniture so you have a clear path. Avoid moving your furniture around.  If any of your floors are uneven, fix them.  If there are any pets around you, be aware of where they are.  Review your medicines with your doctor. Some medicines can make you feel dizzy. This can increase your chance  of falling. Ask your doctor what other things that you can do to help prevent falls. This information is not intended to replace advice given to you by your health care provider. Make sure you discuss any questions you have with your health care provider. Document Released: 12/24/2008 Document Revised: 08/05/2015 Document Reviewed: 04/03/2014 Elsevier Interactive Patient Education  2017 [employer].

## 2020-03-16 ENCOUNTER — Ambulatory Visit: Payer: Medicare PPO | Admitting: Internal Medicine

## 2020-04-01 ENCOUNTER — Ambulatory Visit: Payer: Medicare PPO | Admitting: Internal Medicine

## 2020-04-06 ENCOUNTER — Telehealth: Payer: Self-pay | Admitting: Internal Medicine

## 2020-04-06 NOTE — Telephone Encounter (Signed)
1.Medication Requested: amLODipine (NORVASC) 5 MG tablet  atorvastatin (LIPITOR) 40 MG tablet  irbesartan-hydrochlorothiazide (AVALIDE) 150-12.5 MG tablet    2. Pharmacy (Name, [facility], [place], [place]  3. On Med List: yes   4. Last Visit with PCP: 12.14.21  5. Next visit date with PCP: 2.3.22  Patient is requesting a 90 supply and she said that she has 3 pills left.    Agent: Please be advised that RX refills may take up to 3 business days. We ask that you follow-up with your pharmacy.

## 2020-04-07 ENCOUNTER — Other Ambulatory Visit: Payer: Self-pay

## 2020-04-07 MED ORDER — AMLODIPINE BESYLATE 5 MG PO TABS: 5.0000 mg | ORAL_TABLET | Freq: Every day | ORAL | 0 refills | Status: DC

## 2020-04-07 MED ORDER — ATORVASTATIN CALCIUM 40 MG PO TABS: 40.0000 mg | ORAL_TABLET | Freq: Every day | ORAL | 0 refills | Status: DC

## 2020-04-07 MED ORDER — IRBESARTAN-HYDROCHLOROTHIAZIDE 150-12.5 MG PO TABS: 2.0000 | ORAL_TABLET | Freq: Every day | ORAL | 5 refills | Status: DC

## 2020-04-07 NOTE — Telephone Encounter (Signed)
Sent in today 

## 2020-04-12 NOTE — Progress Notes (Signed)
Subjective:    Patient ID: Shelley Hayes, female    DOB: 04-Sep-1945, 75 y.o.   MRN: [id number]  HPI The patient is here for follow up of their chronic medical problems, including htn, hyperlipidemia, anxiety, prediabetes, h/o cva seen on imaging.  She is taking all of her medications as prescribed.   BP has been even higher than it is here today.    CT in ED 12/17 - went for hypertensive urgency -  IMPRESSION: 1. No acute intracranial abnormality. 2. Mild chronic small vessel ischemia. Small remote lacunar infarcts in the left basal ganglia.   Compliant with low salt diet.  Walking a 1 mile a day.    Medications and allergies reviewed with patient and updated if appropriate.  Patient Active Problem List   Diagnosis Date Noted  . Morbid obesity ([place]) 02/24/2020  . BPPV (benign paroxysmal positional vertigo) 11/30/2017  . Dysphagia 07/26/2017  . Incomplete emptying of bladder 07/25/2017  . Chest pain 04/24/2017  . Prediabetes 04/24/2017  . Herpes zoster without complication 78/29/5621  . Anxiety 04/24/2017  . Muscle cramps 06/19/2016  . Osteopenia 04/26/2016  . Fluttering heart 12/23/2015  . Cervical paraspinal muscle spasm 12/23/2015  . SCIATICA, LEFT 01/20/2010  . Hyperlipidemia 08/23/2009  . GLUCOMA 08/23/2009  . Essential hypertension 08/23/2009  . Allergic rhinitis 08/23/2009  . ARTHRITIS 08/23/2009  . KNEE PAIN 08/23/2009    Current Outpatient Medications on File Prior to Visit  Medication Sig Dispense Refill  . albuterol (VENTOLIN HFA) 108 (90 Base) MCG/ACT inhaler Inhale 2 puffs into the lungs every 6 (six) hours as needed for wheezing or shortness of breath. 18 g 5  . Alcohol Swabs (B-D SINGLE USE SWABS REGULAR) PADS USE AS DIRECTED 100 each 1  . ALPRAZolam (XANAX) 0.5 MG tablet Take 1 tablet (0.5 mg total) by mouth 2 (two) times daily as needed for anxiety or sleep. 30 tablet 0  . amLODipine (NORVASC) 5 MG tablet Take 1 tablet (5 mg total) by mouth daily. 30  tablet 0  . Ascorbic Acid (VITAMIN C) 1000 MG tablet Take 2,000 mg by mouth daily.    Marland Kitchen aspirin 81 MG tablet Take 81 mg by mouth daily.    Marland Kitchen atorvastatin (LIPITOR) 40 MG tablet Take 1 tablet (40 mg total) by mouth daily. 90 tablet 0  . blood glucose meter kit and supplies KIT Dispense based on patient and insurance preference. Use up to four times daily as directed. R73.03. 1 each 0  . Cholecalciferol (VITAMIN D3) 1000 UNITS CAPS Take 1 capsule (1,000 Units total) by mouth daily. 30 capsule   . irbesartan-hydrochlorothiazide (AVALIDE) 150-12.5 MG tablet Take 2 tablets by mouth daily. 60 tablet 5  . latanoprost (XALATAN) 0.005 % ophthalmic solution PLACE 1 DROP INTO BOTH EYES AT BEDTIME. 2.5 mL 0  . Multiple Vitamin (MULTIVITAMIN) tablet Take 1 tablet by mouth daily.    . potassium chloride SA (KLOR-CON) 20 MEQ tablet Take 1 tablet (20 mEq total) by mouth daily. 90 tablet 1  . vitamin B-12 (CYANOCOBALAMIN) 100 MCG tablet Vitamin B12    . vitamin B-12 (CYANOCOBALAMIN) 1000 MCG tablet Take 1 tablet (1,000 mcg total) by mouth daily.     No current facility-administered medications on file prior to visit.    Past Medical History:  Diagnosis Date  . ALLERGIC RHINITIS   . Allergic rhinitis 08/23/2009  . Allergy   . Anxiety   . ARTHRITIS   . Arthritis   . Blood in stool  04/24/2017  . Cataract    bil cataracts removes  . Cervical paraspinal muscle spasm 12/23/2015  . Chest pain 04/24/2017  . CHEST PAIN 12/19/2009   Qualifier: Diagnosis of  By: Charlsie Quest RMA, Lucy    . COLONIC POLYPS, HX OF 08/23/2009   Qualifier: Diagnosis of  By: Charlsie Quest RMA, Lucy    . Cough 06/19/2016  . Fluttering heart 12/23/2015  . GERD (gastroesophageal reflux disease)   . Glaucoma   . GLUCOMA   . Heart murmur   . Herpes zoster without complication 04/24/2017  . Hyperglycemia 06/19/2016  . HYPERLIPIDEMIA   . HYPERTENSION   . KNEE PAIN 08/23/2009   Qualifier: Diagnosis of  By: Felicity Coyer MD, Raenette Rover Muscle cramps 06/19/2016   . Osteopenia 04/26/2016   2/18 - dexa - RFN and LFN  -1.2  . OTITIS EXTERNA, RIGHT 01/20/2010   Qualifier: Diagnosis of  By: Felicity Coyer MD, Raenette Rover Prediabetes 04/24/2017  . SCIATICA, LEFT 11/2010    Past Surgical History:  Procedure Laterality Date  . BREAST BIOPSY Right    benign  . COLONOSCOPY    . ESOPHAGOGASTRODUODENOSCOPY    . POLYPECTOMY    . TUBAL LIGATION    . UPPER GASTROINTESTINAL ENDOSCOPY      Social History   Socioeconomic History  . Marital status: Divorced    Spouse name: Not on file  . Number of children: 2  . Years of education: Not on file  . Highest education level: Not on file  Occupational History  . Occupation: Retired  Tobacco Use  . Smoking status: Never Smoker  . Smokeless tobacco: Never Used  Vaping Use  . Vaping Use: Never used  Substance and Sexual Activity  . Alcohol use: No    Alcohol/week: 0.0 standard drinks  . Drug use: No  . Sexual activity: Not Currently  Other Topics Concern  . Not on file  Social History Narrative   Divorced, lives alone- in [place] for 21yrs but moved "home" to [place] in 2008      Walking regularly, every morning   Social Determinants of Health   Financial Resource Strain: Low Risk   . Difficulty of Paying Living Expenses: Not hard at all  Food Insecurity: No Food Insecurity  . Worried About Programme researcher, broadcasting/film/video in the Last Year: Never true  . Ran Out of Food in the Last Year: Never true  Transportation Needs: No Transportation Needs  . Lack of Transportation (Medical): No  . Lack of Transportation (Non-Medical): No  Physical Activity: Inactive  . Days of Exercise per Week: 0 days  . Minutes of Exercise per Session: 0 min  Stress: No Stress Concern Present  . Feeling of Stress : Not at all  Social Connections: Not on file    Family History  Problem Relation Age of Onset  . Arthritis Mother   . Diabetes Mother   . Colon cancer Mother 93  . Colon polyps Mother   . Arthritis Father   . Alcohol  abuse Brother   . Lung cancer Brother   . Prostate cancer Brother   . Diabetes Brother   . Hypertension Brother   . Lung cancer Other        Neice  . Esophageal cancer Neg Hx   . Liver cancer Neg Hx   . Pancreatic cancer Neg Hx   . Rectal cancer Neg Hx   . Stomach cancer Neg Hx     Review of Systems  Constitutional: Negative for fever.  Respiratory: Negative for shortness of breath.   Cardiovascular: Negative for chest pain, palpitations and leg swelling.  Neurological: Negative for dizziness, light-headedness and headaches.       Objective:   Vitals:   04/13/20 1445  BP: (!) 160/110  Pulse: 81  Temp: 98.4 F (36.9 C)  SpO2: 96%   BP Readings from Last 3 Encounters:  04/13/20 (!) 160/110  02/27/20 (!) 158/73  02/24/20 (!) 150/88   Wt Readings from Last 3 Encounters:  04/13/20 208 lb (94.3 kg)  02/27/20 206 lb (93.4 kg)  02/24/20 208 lb (94.3 kg)   Body mass index is 45.01 kg/m.   Physical Exam    Constitutional: Appears well-developed and well-nourished. No distress.  HENT:  Head: Normocephalic and atraumatic.  Neck: Neck supple. No tracheal deviation present. No thyromegaly present.  No cervical lymphadenopathy Cardiovascular: Normal rate, regular rhythm and normal heart sounds.   No murmur heard. No carotid bruit .  No edema Pulmonary/Chest: Effort normal and breath sounds normal. No respiratory distress. No has no wheezes. No rales.  Skin: Skin is warm and dry. Not diaphoretic.  Psychiatric: Normal mood and affect. Behavior is normal.      Assessment & Plan:    See Problem List for Assessment and Plan of chronic medical problems.    This visit occurred during the SARS-CoV-2 public health emergency.  Safety protocols were in place, including screening questions prior to the visit, additional usage of staff PPE, and extensive cleaning of exam room while observing appropriate contact time as indicated for disinfecting solutions.

## 2020-04-12 NOTE — Patient Instructions (Addendum)
  Blood work was ordered.  Have it done next week.    Medications changes include :   Increase amlodipine 10 mg daily and start spironolactone 25 mg daily  Your prescription(s) have been submitted to your pharmacy. Please take as directed and contact our office if you believe you are having problem(s) with the medication(s).    Please followup in 2 months, sooner if BP is elevated

## 2020-04-13 ENCOUNTER — Encounter: Payer: Self-pay | Admitting: Internal Medicine

## 2020-04-13 ENCOUNTER — Ambulatory Visit (INDEPENDENT_AMBULATORY_CARE_PROVIDER_SITE_OTHER): Payer: Medicare PPO | Admitting: Internal Medicine

## 2020-04-13 VITALS — BP 160/110 | HR 81 | Temp 98.4°F | Ht <= 58 in | Wt 208.0 lb

## 2020-04-13 DIAGNOSIS — I10 Essential (primary) hypertension: Secondary | ICD-10-CM

## 2020-04-13 DIAGNOSIS — F419 Anxiety disorder, unspecified: Secondary | ICD-10-CM

## 2020-04-13 DIAGNOSIS — R7303 Prediabetes: Secondary | ICD-10-CM

## 2020-04-13 MED ORDER — SPIRONOLACTONE 25 MG PO TABS: 25.0000 mg | ORAL_TABLET | Freq: Every day | ORAL | 5 refills | Status: DC

## 2020-04-13 MED ORDER — AMLODIPINE BESYLATE 10 MG PO TABS
10.0000 mg | ORAL_TABLET | Freq: Every day | ORAL | 1 refills | Status: DC
Start: 2020-04-13 — End: 2020-11-23

## 2020-04-13 NOTE — Assessment & Plan Note (Signed)
Chronic BP not controlled Continue continue avalide 300-25 mg daily Start spironolactone 25 mg daily Increase amlodipine to 10 mg daily Bmp - next week

## 2020-04-15 ENCOUNTER — Ambulatory Visit: Payer: Medicare PPO | Admitting: Internal Medicine

## 2020-05-07 NOTE — Progress Notes (Signed)
Cardiology Office Note   Date:  05/12/2020   ID:  Shelley Hayes, DOB 10-26-1945, MRN [id number]  PCP:  Binnie Rail, MD  Cardiologist:   Jenkins Rouge, MD   No chief complaint on file.     History of Present Illness:  75 y.o. has not been seen since 2019  CRF;s HTN and HLD, glucose intolerance. Palpitations benign worse with norvasc and this was d/c SSCP atypical Normal myovue 2015 Myovue 08/30/17 suggesting shifting breast artifact vs ischemia EF 59% decision not to cath due to atypical symptoms and body habitus.  At that time she was walking 2 miles/day with no symptoms   TTE 08/29/17 EF 65-70% with MAC  Event monitor 08/30/17  :  Reviewed express report and only occasional PVC;s  Seen by primary with labile BP and CT 02/27/20 suggesting small lacunar infarct In left basal ganglia Seen in ED for HTN urgency   04/13/20 Avalide continued, norvasc increased to 10 mg and aldactone 25 mg added  No cardiac complaints BP improved Has some arthritic pain in legs/hips not vascular   Past Medical History:  Diagnosis Date  . ALLERGIC RHINITIS   . Allergic rhinitis 08/23/2009  . Allergy   . Anxiety   . ARTHRITIS   . Arthritis   . Blood in stool 04/24/2017  . Cataract    bil cataracts removes  . Cervical paraspinal muscle spasm 12/23/2015  . Chest pain 04/24/2017  . CHEST PAIN 12/19/2009   Qualifier: Diagnosis of  By: Marca Ancona RMA, Lucy    . COLONIC POLYPS, HX OF 08/23/2009   Qualifier: Diagnosis of  By: Marca Ancona RMA, Lucy    . Cough 06/19/2016  . Fluttering heart 12/23/2015  . GERD (gastroesophageal reflux disease)   . Glaucoma   . GLUCOMA   . Heart murmur   . Herpes zoster without complication 08/17/3014  . Hyperglycemia 06/19/2016  . HYPERLIPIDEMIA   . HYPERTENSION   . KNEE PAIN 08/23/2009   Qualifier: Diagnosis of  By: Asa Lente MD, Jannifer Rodney Muscle cramps 06/19/2016  . Osteopenia 04/26/2016   2/18 - dexa - RFN and LFN  -1.2  . OTITIS EXTERNA, RIGHT 01/20/2010   Qualifier:  Diagnosis of  By: Asa Lente MD, Jannifer Rodney Prediabetes 04/24/2017  . SCIATICA, LEFT 11/2010    Past Surgical History:  Procedure Laterality Date  . BREAST BIOPSY Right    benign  . COLONOSCOPY    . ESOPHAGOGASTRODUODENOSCOPY    . POLYPECTOMY    . TUBAL LIGATION    . UPPER GASTROINTESTINAL ENDOSCOPY       Current Outpatient Medications  Medication Sig Dispense Refill  . albuterol (VENTOLIN HFA) 108 (90 Base) MCG/ACT inhaler Inhale 2 puffs into the lungs every 6 (six) hours as needed for wheezing or shortness of breath. 18 g 5  . Alcohol Swabs (B-D SINGLE USE SWABS REGULAR) PADS USE AS DIRECTED 100 each 1  . ALPRAZolam (XANAX) 0.5 MG tablet Take 1 tablet (0.5 mg total) by mouth 2 (two) times daily as needed for anxiety or sleep. 30 tablet 0  . amLODipine (NORVASC) 10 MG tablet Take 1 tablet (10 mg total) by mouth daily. 90 tablet 1  . Ascorbic Acid (VITAMIN C) 1000 MG tablet Take 2,000 mg by mouth daily.    Marland Kitchen aspirin 81 MG tablet Take 81 mg by mouth daily.    Marland Kitchen atorvastatin (LIPITOR) 40 MG tablet Take 1 tablet (40 mg total) by mouth daily. 90 tablet  0  . blood glucose meter kit and supplies KIT Dispense based on patient and insurance preference. Use up to four times daily as directed. R73.03. 1 each 0  . Cholecalciferol (VITAMIN D3) 1000 UNITS CAPS Take 1 capsule (1,000 Units total) by mouth daily. 30 capsule   . irbesartan-hydrochlorothiazide (AVALIDE) 150-12.5 MG tablet Take 2 tablets by mouth daily. (Patient taking differently: Take 1 tablet by mouth daily.) 60 tablet 5  . latanoprost (XALATAN) 0.005 % ophthalmic solution PLACE 1 DROP INTO BOTH EYES AT BEDTIME. 2.5 mL 0  . Multiple Vitamin (MULTIVITAMIN) tablet Take 1 tablet by mouth daily.    . potassium chloride SA (KLOR-CON) 20 MEQ tablet Take 1 tablet (20 mEq total) by mouth daily. 90 tablet 1  . vitamin B-12 (CYANOCOBALAMIN) 100 MCG tablet Vitamin B12    . vitamin B-12 (CYANOCOBALAMIN) 1000 MCG tablet Take 1 tablet (1,000 mcg  total) by mouth daily.     No current facility-administered medications for this visit.    Allergies:   Hydrocodone-homatropine, Losartan, and Spironolactone    Social History:  The patient  reports that she has never smoked. She has never used smokeless tobacco. She reports that she does not drink alcohol and does not use drugs.   Family History:  The patient's family history includes Alcohol abuse in her brother; Arthritis in her father and mother; Colon cancer (age of onset: 59) in her mother; Colon polyps in her mother; Diabetes in her brother and mother; Hypertension in her brother; Lung cancer in her brother and another family member; Prostate cancer in her brother.    ROS:  Please see the history of present illness.   Otherwise, review of systems are positive for none.   All other systems are reviewed and negative.    PHYSICAL EXAM: VS:  BP 110/66   Pulse 85   Ht _0  (1.448 m)   Wt 95.3 kg   SpO2 98%   BMI 45.44 kg/m  , BMI Body mass index is 45.44 kg/m. Affect appropriate Healthy:  appears stated age 75: normal Neck supple with no adenopathy JVP normal no bruits no thyromegaly Lungs clear with no wheezing and good diaphragmatic motion Heart:  S1/S2 SEM  murmur, no rub, gallop or click PMI normal Abdomen: benighn, BS positve, no tenderness, no AAA no bruit.  No HSM or HJR Distal pulses intact with no bruits No edema Neuro non-focal Skin warm and dry No muscular weakness    EKG:  NSR rate 86 poor R wave progression non specific ST changes Q in lead 3   Recent Labs: 02/27/2020: ALT 14; BUN 17; Creatinine, Ser 1.02; Hemoglobin 14.2; Platelets 334; Potassium 3.7; Sodium 142; TSH 1.690    Lipid Panel    Component Value Date/Time   CHOL 265 (H) 12/22/2019 1000   TRIG 113.0 12/22/2019 1000   HDL 45.80 12/22/2019 1000   CHOLHDL 6 12/22/2019 1000   VLDL 22.6 12/22/2019 1000   LDLCALC 197 (H) 12/22/2019 1000   LDLDIRECT 198.9 12/30/2012 1037      Wt  Readings from Last 3 Encounters:  05/12/20 95.3 kg  04/13/20 94.3 kg  02/27/20 93.4 kg      Other studies Reviewed: myovue 08/30/17 TTE 08/30/27 Monitor 08/30/17    ASSESSMENT AND PLAN:  1.  Palpitations benign sounding event monitor with occasional PVC observe  2. Chest Pain : resolved active walking 2 miles./ day likely false positive myovue With large chest/breast tissue observe  3. HTN :  Improved continue  current meds f/u primary  4. Anxiety :  PRN xanax   5. HLD:  Continue statin labs with primary  6. Dyspnea:  Likely functional normal EF by myovue and TTE no valve disease    Current medicines are reviewed at length with the patient today.  The patient does not have concerns regarding medicines.  The following changes have been made:  None   Labs/ tests ordered today include: None   No orders of the defined types were placed in this encounter.  F/U PRN   Jenkins Rouge

## 2020-05-08 ENCOUNTER — Encounter: Payer: Self-pay | Admitting: Internal Medicine

## 2020-05-11 ENCOUNTER — Ambulatory Visit (INDEPENDENT_AMBULATORY_CARE_PROVIDER_SITE_OTHER): Payer: Medicare PPO | Admitting: Pharmacist

## 2020-05-11 ENCOUNTER — Other Ambulatory Visit: Payer: Self-pay

## 2020-05-11 DIAGNOSIS — E782 Mixed hyperlipidemia: Secondary | ICD-10-CM | POA: Diagnosis not present

## 2020-05-11 DIAGNOSIS — I10 Essential (primary) hypertension: Secondary | ICD-10-CM

## 2020-05-11 NOTE — Progress Notes (Signed)
Chronic Care Management Pharmacy Note  05/11/2020 Name:  Shelley Hayes MRN:  [id number] DOB:  [birth year]  Subjective: Shelley Hayes is an 75 y.o. year old female who is a primary patient of Burns, Claudina Lick, MD.  The CCM team was consulted for assistance with disease management and care coordination needs.    Engaged with patient by telephone for follow up visit in response to provider referral for pharmacy case management and/or care coordination services.   Consent to Services:  The patient was given information about Chronic Care Management services, agreed to services, and gave verbal consent prior to initiation of services.  Please see initial visit note for detailed documentation.   Patient Care Team: Binnie Rail, MD as PCP - General (Internal Medicine) Josue Hector, MD as PCP - Cardiology (Cardiology) Everlene Farrier, MD (Obstetrics and Gynecology) Victory Dakin, MD as Referring Physician (Optometry) Charlton Haws, [facility] as Pharmacist (Pharmacist)  Recent office visits: 04/13/20 Dr Quay Burow OV: chronic f/u, high BP. Start spironolactone 25 mg and increase amlodipine to 10 mg. Repeat BMP 1 week.  12/22/19 Dr Quay Burow OV: chronic f/u. Ordered DEXA. Pt does not like amlodipine, dc'd and started irbesartan-HCTZ 150-12.5 mg, stop potassium. LDL 197, advised to restart statin.  Recent consult visits: 02/13/20 colonoscopy  Hospital visits: ED 02/27/20 for hypertensive urgency  Objective:  Lab Results  Component Value Date   CREATININE 1.02 (H) 02/27/2020   BUN 17 02/27/2020   GFR 61.22 12/22/2019   GFRNONAA 58 (L) 02/27/2020   NA 142 02/27/2020   K 3.7 02/27/2020   CALCIUM 9.4 02/27/2020   CO2 26 02/27/2020    Lab Results  Component Value Date/Time   HGBA1C 5.6 12/22/2019 10:00 AM   HGBA1C 5.9 12/16/2018 11:35 AM   GFR 61.22 12/22/2019 10:00 AM   GFR 73.27 12/16/2018 11:35 AM    Last diabetic Eye exam:  Lab Results  Component Value Date/Time   HMDIABEYEEXA Normal  02/07/2010 12:00 AM    Last diabetic Foot exam: No results found for: HMDIABFOOTEX   Lab Results  Component Value Date   CHOL 265 (H) 12/22/2019   HDL 45.80 12/22/2019   LDLCALC 197 (H) 12/22/2019   LDLDIRECT 198.9 12/30/2012   TRIG 113.0 12/22/2019   CHOLHDL 6 12/22/2019    Hepatic Function Latest Ref Rng & Units 02/27/2020 12/22/2019 12/16/2018  Total Protein 6.5 - 8.1 g/dL 8.7(H) 8.7(H) 8.6(H)  Albumin 3.5 - 5.0 g/dL 4.3 4.4 4.4  AST 15 - 41 U/L _0 ALT 0 - 44 U/L _1 Alk Phosphatase 38 - 126 U/L 83 79 89  Total Bilirubin 0.3 - 1.2 mg/dL 0.1(L) 0.4 0.5  Bilirubin, Direct 0.0 - 0.3 mg/dL - - -    Lab Results  Component Value Date/Time   TSH 1.690 02/27/2020 05:56 PM   TSH 1.32 12/22/2019 10:00 AM   TSH 1.24 12/16/2018 11:35 AM    CBC Latest Ref Rng & Units 02/27/2020 12/22/2019 12/16/2018  WBC 4.0 - 10.5 K/uL 8.7 8.9 10.1  Hemoglobin 12.0 - 15.0 g/dL 14.2 13.7 13.7  Hematocrit 36.0 - 46.0 % 45.7 43.5 42.4  Platelets 150 - 400 K/uL 334 369.0 329.0    No results found for: VD25OH  Clinical ASCVD: No  The 10-year ASCVD risk score Mikey Bussing DC Jr., et al., 2013) is: 48.5%   Values used to calculate the score:     Age: 67 years     Sex: Female  Is Non-Hispanic African American: Yes     Diabetic: Yes     Tobacco smoker: No     Systolic Blood Pressure: 592 mmHg     Is BP treated: Yes     HDL Cholesterol: 45.8 mg/dL     Total Cholesterol: 265 mg/dL    Depression screen [facility] 2/9 03/15/2020 12/22/2019 12/22/2019  Decreased Interest 0 0 0  Down, Depressed, Hopeless 0 0 0  PHQ - 2 Score 0 0 0  Altered sleeping - - -  Tired, decreased energy - - -  Change in appetite - - -  Feeling bad or failure about yourself  - - -  Trouble concentrating - - -  Moving slowly or fidgety/restless - - -  Suicidal thoughts - - -  PHQ-9 Score - - -  Difficult doing work/chores - - -     Social History   Tobacco Use  Smoking Status Never Smoker  Smokeless Tobacco Never  Used   BP Readings from Last 3 Encounters:  04/13/20 (!) 160/110  02/27/20 (!) 158/73  02/24/20 (!) 150/88   Pulse Readings from Last 3 Encounters:  04/13/20 81  02/27/20 (!) 58  02/24/20 91   Wt Readings from Last 3 Encounters:  04/13/20 208 lb (94.3 kg)  02/27/20 206 lb (93.4 kg)  02/24/20 208 lb (94.3 kg)    Assessment/Interventions: Review of patient past medical history, allergies, medications, health status, including review of consultants reports, laboratory and other test data, was performed as part of comprehensive evaluation and provision of chronic care management services.   SDOH:  (Social Determinants of Health) assessments and interventions performed: Yes   Pt lives with daughter, which has been an adjustment as she was previously used to living alone. She sometimes watches Youtube videos about medication side effects that worry her.   CCM Care Plan  Allergies  Allergen Reactions  . Hydrocodone-Homatropine Other (See Comments)    "don't give me that stuff - i'll get hooked!"  . Losartan Other (See Comments)    palpitations  . Spironolactone Other (See Comments)    Fatigue/malaise    Medications Reviewed Today    Reviewed by Charlton Haws, [facility] (Pharmacist) on 05/11/20 at [place] List Status: <None>  Medication Order Taking? Sig Documenting Provider Last Dose Status Informant  albuterol (VENTOLIN HFA) 108 (90 Base) MCG/ACT inhaler [id number] Yes Inhale 2 puffs into the lungs every 6 (six) hours as needed for wheezing or shortness of breath. Binnie Rail, MD Taking Active   Alcohol Swabs (B-D SINGLE USE SWABS REGULAR) PADS [id number] Yes USE AS DIRECTED Quay Burow, Claudina Lick, MD Taking Active   ALPRAZolam Duanne Moron) 0.5 MG tablet [id number] Yes Take 1 tablet (0.5 mg total) by mouth 2 (two) times daily as needed for anxiety or sleep. Binnie Rail, MD Taking Active   amLODipine (NORVASC) 10 MG tablet [id number] Yes Take 1 tablet (10 mg total) by mouth daily. Binnie Rail, MD Taking Active   Ascorbic Acid (VITAMIN C) 1000 MG tablet [id number] Yes Take 2,000 mg by mouth daily. [provider] Taking Active Self  aspirin 81 MG tablet [id number] Yes Take 81 mg by mouth daily. [provider] Taking Active   atorvastatin (LIPITOR) 40 MG tablet [id number] Yes Take 1 tablet (40 mg total) by mouth daily. Binnie Rail, MD Taking Active   blood glucose meter kit and supplies KIT [id number] Yes Dispense based on patient and insurance preference. Use up to four times daily  as directed. R73.03Binnie Rail, MD Taking Active   Cholecalciferol (VITAMIN D3) 1000 UNITS CAPS [id number] Yes Take 1 capsule (1,000 Units total) by mouth daily. Rowe Clack, MD Taking Active   irbesartan-hydrochlorothiazide (AVALIDE) 150-12.5 MG tablet [id number] Yes Take 2 tablets by mouth daily.  Patient taking differently: Take 1 tablet by mouth daily.   Binnie Rail, MD Taking Active   latanoprost (XALATAN) 0.005 % ophthalmic solution [id number] Yes PLACE 1 DROP INTO BOTH EYES AT BEDTIME. Binnie Rail, MD Taking Active   Multiple Vitamin (MULTIVITAMIN) tablet [id number] Yes Take 1 tablet by mouth daily. [provider] Taking Active   potassium chloride SA (KLOR-CON) 20 MEQ tablet [id number] Yes Take 1 tablet (20 mEq total) by mouth daily. Binnie Rail, MD Taking Active   vitamin B-12 (CYANOCOBALAMIN) 100 MCG tablet [id number] Yes Vitamin B12 [provider] Taking Active   vitamin B-12 (CYANOCOBALAMIN) 1000 MCG tablet [id number] Yes Take 1 tablet (1,000 mcg total) by mouth daily. Rowe Clack, MD Taking Active           Patient Active Problem List   Diagnosis Date Noted  . Morbid obesity ([place]) 02/24/2020  . BPPV (benign paroxysmal positional vertigo) 11/30/2017  . Dysphagia 07/26/2017  . Incomplete emptying of bladder 07/25/2017  . Chest pain 04/24/2017  . Prediabetes 04/24/2017  . Herpes zoster without complication 65/05/5463  .  Anxiety 04/24/2017  . Muscle cramps 06/19/2016  . Osteopenia 04/26/2016  . Fluttering heart 12/23/2015  . Cervical paraspinal muscle spasm 12/23/2015  . SCIATICA, LEFT 01/20/2010  . Hyperlipidemia 08/23/2009  . GLUCOMA 08/23/2009  . Uncontrolled hypertension 08/23/2009  . Allergic rhinitis 08/23/2009  . ARTHRITIS 08/23/2009  . KNEE PAIN 08/23/2009    Immunization History  Administered Date(s) Administered  . Fluad Quad(high Dose 65+) 12/16/2018, 12/23/2019  . Influenza Whole 01/20/2010  . Influenza, High Dose Seasonal PF 12/30/2012, 02/26/2015, 02/29/2016, 02/06/2017, 03/27/2018  . Influenza,inj,Quad PF,6+ Mos 02/23/2014  . Janssen (J&J) SARS-COV-2 Vaccination 06/13/2019  . Pneumococcal Conjugate-13 02/29/2016  . Pneumococcal Polysaccharide-23 02/23/2014  . Tetanus 10/25/2011    Conditions to be addressed/monitored:  Hypertension and Hyperlipidemia  Care Plan : [place]  Updates made by Charlton Haws, [place] since 05/11/2020 12:00 AM    Problem: Hypertension and Hyperlipidemia   Priority: High    Long-Range Goal: Disease management   Start Date: 05/11/2020  Expected End Date: 11/11/2020  This Visit's Progress: On track  Priority: High  Note:   Current Barriers:  . Unable to independently monitor therapeutic efficacy . Unable to maintain control of blood pressure  Pharmacist Clinical Goal(s):  Marland Kitchen Over the next 90 days, patient will achieve adherence to monitoring guidelines and medication adherence to achieve therapeutic efficacy . maintain control of blood pressure as evidenced by improved home readings  through collaboration with PharmD and provider.   Interventions: . 1:1 collaboration with Binnie Rail, MD regarding development and update of comprehensive plan of care as evidenced by provider attestation and co-signature . Inter-disciplinary care team collaboration (see longitudinal plan of care) . Comprehensive medication review performed;  medication list updated in electronic medical record  Hypertension (BP goal < 140/90) Not ideally controlled- home readings 137/83, 143/86, 149/86 133/89, 139/9, 153/95, 147/95, 126/72 152/100, 152/92, 117/79, 145/92, 142/91, 139/89.  -pt stopped taking spironolactone after 3 days due to general malaise/fatigue. Pt has lab appt for BMP tomorrow Current regimen:  ? Amlodipine 10 mg daily  ? Irbesartan-HCTZ 150-12.5 mg - 1  tab daily (previously was taking 2 daily but she thought it wasn't helping BP and she is worried it is hurting her kidneys) Interventions: ? Discussed BP goals and benefits of medications for prevention of heart attack / stroke ? Counseled on benefits of exercise - pt is walking 2 miles (1 hour) every day ? Educated about damaging effects of high BP, especially on kidney function ? Recommend to continue current medication and keep lab appt tomorrow   Hyperlipidemia (LDL goal < 130) Uncontrolled - most recent LDL 197, pt was not taking statin for several months prior. Now she has restarted. Current regimen:  ? Atorvastatin 40 mg daily Interventions: ? Discussed cholesterol goals and benefits of medications for prevention of heart attack / stroke ? Recommend to continue current medication  Patient Goals/Self-Care Activities . Over the next 90 days, patient will:  - take medications as prescribed focus on medication adherence by pill box check blood pressure daily, document, and provide at future appointments target a minimum of 150 minutes of moderate intensity exercise weekly engage in dietary modifications by limiting salt to < 2000 mg/day  Follow Up Plan: Telephone follow up appointment with care management team member scheduled for: 3 months      Medication Assistance: None required.  Patient affirms current coverage meets needs.  Patient's preferred pharmacy is:  [facility] [address] [place]  [address] Phone: [phone] Fax: [phone]  [facility], [place] [address] Phone: [phone] Fax: [phone]  Uses pill box? Yes Pt endorses 100% compliance  We discussed: Current pharmacy is preferred with insurance plan and patient is satisfied with pharmacy services Patient decided to: Continue current medication management strategy  Care Plan and Follow Up Patient Decision:  Patient agrees to Care Plan and Follow-up.  Plan: Telephone follow up appointment with care management team member scheduled for:  3 months  Charlene Brooke, PharmD, [facility] Clinical Pharmacist [facility] at [facility] [phone]

## 2020-05-11 NOTE — Patient Instructions (Signed)
Visit Information  Phone number for Pharmacist: [phone]  Goals Addressed   None    Patient Care Plan: [place] Plan    Problem Identified: Hypertension and Hyperlipidemia   Priority: High    Long-Range Goal: Disease management   Start Date: 05/11/2020  Expected End Date: 11/11/2020  This Visit's Progress: On track  Priority: High  Note:   Current Barriers:  . Unable to independently monitor therapeutic efficacy . Unable to maintain control of blood pressure  Pharmacist Clinical Goal(s):  Marland Kitchen Over the next 90 days, patient will achieve adherence to monitoring guidelines and medication adherence to achieve therapeutic efficacy . maintain control of blood pressure as evidenced by improved home readings  through collaboration with PharmD and provider.   Interventions: . 1:1 collaboration with Binnie Rail, MD regarding development and update of comprehensive plan of care as evidenced by provider attestation and co-signature . Inter-disciplinary care team collaboration (see longitudinal plan of care) . Comprehensive medication review performed; medication list updated in electronic medical record  Hypertension (BP goal < 140/90) Not ideally controlled- home readings 137/83, 143/86, 149/86 133/89, 139/9, 153/95, 147/95, 126/72 152/100, 152/92, 117/79, 145/92, 142/91, 139/89.  -pt stopped taking spironolactone after 3 days due to general malaise/fatigue. Pt has lab appt for BMP tomorrow Current regimen:  ? Amlodipine 10 mg daily  ? Irbesartan-HCTZ 150-12.5 mg - 1 tab daily (previously was taking 2 daily but she thought it wasn't helping BP and she is worried it is hurting her kidneys) Interventions: ? Discussed BP goals and benefits of medications for prevention of heart attack / stroke ? Counseled on benefits of exercise - pt is walking 2 miles (1 hour) every day ? Educated about damaging effects of high BP, especially on kidney function ? Recommend to continue current  medication and keep lab appt tomorrow   Hyperlipidemia (LDL goal < 130) Uncontrolled - most recent LDL 197, pt was not taking statin for several months prior. Now she has restarted. Current regimen:  ? Atorvastatin 40 mg daily Interventions: ? Discussed cholesterol goals and benefits of medications for prevention of heart attack / stroke ? Recommend to continue current medication  Patient Goals/Self-Care Activities . Over the next 90 days, patient will:  - take medications as prescribed focus on medication adherence by pill box check blood pressure daily, document, and provide at future appointments target a minimum of 150 minutes of moderate intensity exercise weekly engage in dietary modifications by limiting salt to < 2000 mg/day  Follow Up Plan: Telephone follow up appointment with care management team member scheduled for: 3 months      The patient verbalized understanding of instructions, educational materials, and care plan provided today and declined offer to receive copy of patient instructions, educational materials, and care plan.  Telephone follow up appointment with pharmacy team member scheduled for: 3 months  Charlene Brooke, PharmD, [facility] Clinical Pharmacist [facility] at [facility] [phone]

## 2020-05-12 ENCOUNTER — Encounter: Payer: Self-pay | Admitting: Cardiovascular Disease

## 2020-05-12 ENCOUNTER — Other Ambulatory Visit (INDEPENDENT_AMBULATORY_CARE_PROVIDER_SITE_OTHER): Payer: Medicare PPO

## 2020-05-12 ENCOUNTER — Ambulatory Visit: Payer: Medicare PPO | Admitting: Cardiovascular Disease

## 2020-05-12 VITALS — BP 110/66 | HR 85 | Ht <= 58 in | Wt 210.0 lb

## 2020-05-12 DIAGNOSIS — I10 Essential (primary) hypertension: Secondary | ICD-10-CM

## 2020-05-12 DIAGNOSIS — I6381 Other cerebral infarction due to occlusion or stenosis of small artery: Secondary | ICD-10-CM | POA: Diagnosis not present

## 2020-05-12 LAB — BASIC METABOLIC PANEL
BUN: 15 mg/dL (ref 6–23)
CO2: 29 mEq/L (ref 19–32)
Calcium: 9.7 mg/dL (ref 8.4–10.5)
Chloride: 106 mEq/L (ref 96–112)
Creatinine, Ser: 0.92 mg/dL (ref 0.40–1.20)
GFR: 61.27 mL/min (ref >60.00)
Glucose, Bld: 92 mg/dL (ref 70–99)
Potassium: 3.7 mEq/L (ref 3.5–5.1)
Sodium: 142 mEq/L (ref 135–145)

## 2020-05-12 NOTE — Patient Instructions (Addendum)
Medication Instructions:  *If you need a refill on your cardiac medications before your next appointment, please call your pharmacy*  Lab Work: If you have labs (blood work) drawn today and your tests are completely normal, you will receive your results only by: Marland Kitchen MyChart Message (if you have MyChart) OR . A paper copy in the mail If you have any lab test that is abnormal or we need to change your treatment, we will call you to review the results.  Testing/Procedures: None ordered today.   Follow-Up: At [facility], you and your health needs are our priority.  As part of our continuing mission to provide you with exceptional heart care, we have created designated Provider Care Teams.  These Care Teams include your primary Cardiologist (physician) and Advanced Practice Providers (APPs -  Physician Assistants and Nurse Practitioners) who all work together to provide you with the care you need, when you need it.  We recommend signing up for the patient portal called "MyChart".  Sign up information is provided on this After Visit Summary.  MyChart is used to connect with patients for Virtual Visits (Telemedicine).  Patients are able to view lab/test results, encounter notes, upcoming appointments, etc.  Non-urgent messages can be sent to your provider as well.   To learn more about what you can do with MyChart, go to [email or web address].    Your next appointment:   As needed  The format for your next appointment:   In Person  Provider:   You may see Jenkins Rouge, MD or one of the following Advanced Practice Providers on your designated Care Team:    Kathyrn Drown, NP

## 2020-06-20 DIAGNOSIS — Z8673 Personal history of transient ischemic attack (TIA), and cerebral infarction without residual deficits: Secondary | ICD-10-CM | POA: Insufficient documentation

## 2020-06-20 NOTE — Progress Notes (Signed)
Subjective:    Patient ID: Shelley Hayes, female    DOB: [birth year], 75 y.o.   MRN: [id number]  HPI The patient is here for follow up of their chronic medical problems, including htn, hyperlipidemia, prediabetes, h/o CVA on imaging, morbid obesity, anxiety  She is exercising regularly- walks in the [place].   She has lost some weight.   She has lumbar back pain and she has pain radiating down the left leg to her mid lower leg.  She would like to do PT.   BP at home is lower.  110/74.    Medications and allergies reviewed with patient and updated if appropriate.  Patient Active Problem List   Diagnosis Date Noted  . History of lacunar cerebrovascular accident (CVA) 06/20/2020  . Morbid obesity ([place]) 02/24/2020  . BPPV (benign paroxysmal positional vertigo) 11/30/2017  . Dysphagia 07/26/2017  . Incomplete emptying of bladder 07/25/2017  . Chest pain 04/24/2017  . Prediabetes 04/24/2017  . Herpes zoster without complication 65/99/3570  . Anxiety 04/24/2017  . Muscle cramps 06/19/2016  . Osteopenia 04/26/2016  . Fluttering heart 12/23/2015  . Cervical paraspinal muscle spasm 12/23/2015  . SCIATICA, LEFT 01/20/2010  . Hyperlipidemia 08/23/2009  . GLUCOMA 08/23/2009  . Hypertension 08/23/2009  . Allergic rhinitis 08/23/2009  . ARTHRITIS 08/23/2009  . KNEE PAIN 08/23/2009    Current Outpatient Medications on File Prior to Visit  Medication Sig Dispense Refill  . albuterol (VENTOLIN HFA) 108 (90 Base) MCG/ACT inhaler Inhale 2 puffs into the lungs every 6 (six) hours as needed for wheezing or shortness of breath. 18 g 5  . Alcohol Swabs (B-D SINGLE USE SWABS REGULAR) PADS USE AS DIRECTED 100 each 1  . ALPRAZolam (XANAX) 0.5 MG tablet Take 1 tablet (0.5 mg total) by mouth 2 (two) times daily as needed for anxiety or sleep. 30 tablet 0  . amLODipine (NORVASC) 10 MG tablet Take 1 tablet (10 mg total) by mouth daily. 90 tablet 1  . Ascorbic Acid (VITAMIN C) 1000 MG tablet Take 2,000  mg by mouth daily.    Marland Kitchen aspirin 81 MG tablet Take 81 mg by mouth daily.    Marland Kitchen atorvastatin (LIPITOR) 40 MG tablet Take 1 tablet (40 mg total) by mouth daily. 90 tablet 0  . blood glucose meter kit and supplies KIT Dispense based on patient and insurance preference. Use up to four times daily as directed. R73.03. 1 each 0  . Cholecalciferol (VITAMIN D3) 1000 UNITS CAPS Take 1 capsule (1,000 Units total) by mouth daily. 30 capsule   . irbesartan-hydrochlorothiazide (AVALIDE) 150-12.5 MG tablet Take 2 tablets by mouth daily. (Patient taking differently: Take 1 tablet by mouth daily.) 60 tablet 5  . latanoprost (XALATAN) 0.005 % ophthalmic solution PLACE 1 DROP INTO BOTH EYES AT BEDTIME. 2.5 mL 0  . Multiple Vitamin (MULTIVITAMIN) tablet Take 1 tablet by mouth daily.    . potassium chloride SA (KLOR-CON) 20 MEQ tablet Take 1 tablet (20 mEq total) by mouth daily. 90 tablet 1  . vitamin B-12 (CYANOCOBALAMIN) 100 MCG tablet Vitamin B12    . vitamin B-12 (CYANOCOBALAMIN) 1000 MCG tablet Take 1 tablet (1,000 mcg total) by mouth daily.     No current facility-administered medications on file prior to visit.    Past Medical History:  Diagnosis Date  . ALLERGIC RHINITIS   . Allergic rhinitis 08/23/2009  . Allergy   . Anxiety   . ARTHRITIS   . Arthritis   . Blood in stool  04/24/2017  . Cataract    bil cataracts removes  . Cervical paraspinal muscle spasm 12/23/2015  . Chest pain 04/24/2017  . CHEST PAIN 12/19/2009   Qualifier: Diagnosis of  By: Marca Ancona RMA, Lucy    . COLONIC POLYPS, HX OF 08/23/2009   Qualifier: Diagnosis of  By: Marca Ancona RMA, Lucy    . Cough 06/19/2016  . Fluttering heart 12/23/2015  . GERD (gastroesophageal reflux disease)   . Glaucoma   . GLUCOMA   . Heart murmur   . Herpes zoster without complication 05/11/6008  . Hyperglycemia 06/19/2016  . HYPERLIPIDEMIA   . HYPERTENSION   . KNEE PAIN 08/23/2009   Qualifier: Diagnosis of  By: Asa Lente MD, Jannifer Rodney Muscle cramps 06/19/2016  .  Osteopenia 04/26/2016   2/18 - dexa - RFN and LFN  -1.2  . OTITIS EXTERNA, RIGHT 01/20/2010   Qualifier: Diagnosis of  By: Asa Lente MD, Jannifer Rodney Prediabetes 04/24/2017  . SCIATICA, LEFT 11/2010    Past Surgical History:  Procedure Laterality Date  . BREAST BIOPSY Right    benign  . COLONOSCOPY    . ESOPHAGOGASTRODUODENOSCOPY    . POLYPECTOMY    . TUBAL LIGATION    . UPPER GASTROINTESTINAL ENDOSCOPY      Social History   Socioeconomic History  . Marital status: Divorced    Spouse name: Not on file  . Number of children: 2  . Years of education: Not on file  . Highest education level: Not on file  Occupational History  . Occupation: Retired  Tobacco Use  . Smoking status: Never Smoker  . Smokeless tobacco: Never Used  Vaping Use  . Vaping Use: Never used  Substance and Sexual Activity  . Alcohol use: No    Alcohol/week: 0.0 standard drinks  . Drug use: No  . Sexual activity: Not Currently  Other Topics Concern  . Not on file  Social History Narrative   Divorced, lives alone- in [place] for [place]yrs but moved "home" to [place] in 2008      Walking regularly, every morning   Social Determinants of Health   Financial Resource Strain: Low Risk   . Difficulty of Paying Living Expenses: Not hard at all  Food Insecurity: No Food Insecurity  . Worried About Charity fundraiser in the Last Year: Never true  . Ran Out of Food in the Last Year: Never true  Transportation Needs: No Transportation Needs  . Lack of Transportation (Medical): No  . Lack of Transportation (Non-Medical): No  Physical Activity: Inactive  . Days of Exercise per Week: 0 days  . Minutes of Exercise per Session: 0 min  Stress: No Stress Concern Present  . Feeling of Stress : Not at all  Social Connections: Not on file    Family History  Problem Relation Age of Onset  . Arthritis Mother   . Diabetes Mother   . Colon cancer Mother 35  . Colon polyps Mother   . Arthritis Father   . Alcohol abuse  Brother   . Lung cancer Brother   . Prostate cancer Brother   . Diabetes Brother   . Hypertension Brother   . Lung cancer Other        Neice  . Esophageal cancer Neg Hx   . Liver cancer Neg Hx   . Pancreatic cancer Neg Hx   . Rectal cancer Neg Hx   . Stomach cancer Neg Hx     Review of Systems  Constitutional: Negative for chills and fever.  Respiratory: Negative for cough, shortness of breath and wheezing.   Cardiovascular: Negative for chest pain, palpitations and leg swelling.  Musculoskeletal: Positive for back pain (radiation down left leg).  Neurological: Positive for numbness (left leg). Negative for light-headedness and headaches.       Objective:   Vitals:   06/21/20 0827  BP: 138/80  Pulse: 76  Temp: 98.6 F (37 C)  SpO2: 96%   BP Readings from Last 3 Encounters:  06/21/20 138/80  05/12/20 110/66  04/13/20 (!) 160/110   Wt Readings from Last 3 Encounters:  06/21/20 205 lb (93 kg)  05/12/20 210 lb (95.3 kg)  04/13/20 208 lb (94.3 kg)   Body mass index is 44.36 kg/m.   Physical Exam    Constitutional: Appears well-developed and well-nourished. No distress.  HENT:  Head: Normocephalic and atraumatic.  Neck: Neck supple. No tracheal deviation present. No thyromegaly present.  No cervical lymphadenopathy Cardiovascular: Normal rate, regular rhythm and normal heart sounds.   No murmur heard. No carotid bruit .  No edema Pulmonary/Chest: Effort normal and breath sounds normal. No respiratory distress. No has no wheezes. No rales.  Skin: Skin is warm and dry. Not diaphoretic.  Psychiatric: Normal mood and affect. Behavior is normal.    Lab Results  Component Value Date   WBC 8.7 02/27/2020   HGB 14.2 02/27/2020   HCT 45.7 02/27/2020   PLT 334 02/27/2020   GLUCOSE 92 05/12/2020   CHOL 265 (H) 12/22/2019   TRIG 113.0 12/22/2019   HDL 45.80 12/22/2019   LDLDIRECT 198.9 12/30/2012   LDLCALC 197 (H) 12/22/2019   ALT 14 02/27/2020   AST 27  02/27/2020   NA 142 05/12/2020   K 3.7 05/12/2020   CL 106 05/12/2020   CREATININE 0.92 05/12/2020   BUN 15 05/12/2020   CO2 29 05/12/2020   TSH 1.690 02/27/2020   HGBA1C 5.6 12/22/2019     Assessment & Plan:    See Problem List for Assessment and Plan of chronic medical problems.    This visit occurred during the SARS-CoV-2 public health emergency.  Safety protocols were in place, including screening questions prior to the visit, additional usage of staff PPE, and extensive cleaning of exam room while observing appropriate contact time as indicated for disinfecting solutions.

## 2020-06-20 NOTE — Patient Instructions (Addendum)
    Blood work was ordered.      Medications changes include :   none     Please followup in 6 months  

## 2020-06-21 ENCOUNTER — Encounter: Payer: Self-pay | Admitting: Internal Medicine

## 2020-06-21 ENCOUNTER — Other Ambulatory Visit: Payer: Self-pay

## 2020-06-21 ENCOUNTER — Ambulatory Visit (INDEPENDENT_AMBULATORY_CARE_PROVIDER_SITE_OTHER): Payer: Medicare PPO | Admitting: Internal Medicine

## 2020-06-21 VITALS — BP 138/80 | HR 76 | Temp 98.6°F | Ht <= 58 in | Wt 205.0 lb

## 2020-06-21 DIAGNOSIS — F419 Anxiety disorder, unspecified: Secondary | ICD-10-CM | POA: Diagnosis not present

## 2020-06-21 DIAGNOSIS — E782 Mixed hyperlipidemia: Secondary | ICD-10-CM | POA: Diagnosis not present

## 2020-06-21 DIAGNOSIS — R7303 Prediabetes: Secondary | ICD-10-CM

## 2020-06-21 DIAGNOSIS — Z8673 Personal history of transient ischemic attack (TIA), and cerebral infarction without residual deficits: Secondary | ICD-10-CM

## 2020-06-21 DIAGNOSIS — I10 Essential (primary) hypertension: Secondary | ICD-10-CM

## 2020-06-21 DIAGNOSIS — M545 Low back pain, unspecified: Secondary | ICD-10-CM

## 2020-06-21 LAB — COMPREHENSIVE METABOLIC PANEL
ALT: 13 U/L (ref 0–35)
AST: 20 U/L (ref 0–37)
Albumin: 4.1 g/dL (ref 3.5–5.2)
Alkaline Phosphatase: 74 U/L (ref 39–117)
BUN: 11 mg/dL (ref 6–23)
CO2: 31 mEq/L (ref 19–32)
Calcium: 9.7 mg/dL (ref 8.4–10.5)
Chloride: 104 mEq/L (ref 96–112)
Creatinine, Ser: 0.92 mg/dL (ref 0.40–1.20)
GFR: 61.22 mL/min (ref >60.00)
Glucose, Bld: 90 mg/dL (ref 70–99)
Potassium: 3.8 mEq/L (ref 3.5–5.1)
Sodium: 142 mEq/L (ref 135–145)
Total Bilirubin: 0.7 mg/dL (ref 0.2–1.2)
Total Protein: 7.9 g/dL (ref 6.0–8.3)

## 2020-06-21 LAB — LIPID PANEL
Cholesterol: 159 mg/dL (ref 0–200)
HDL: 38.7 mg/dL — ABNORMAL LOW (ref >39.00)
LDL Cholesterol: 99 mg/dL (ref 0–99)
NonHDL: 120.05
Total CHOL/HDL Ratio: 4
Triglycerides: 107 mg/dL (ref 0.0–149.0)
VLDL: 21.4 mg/dL (ref 0.0–40.0)

## 2020-06-21 LAB — HEMOGLOBIN A1C: Hgb A1c MFr Bld: 5.7 % (ref 4.6–6.5)

## 2020-06-21 NOTE — Assessment & Plan Note (Addendum)
Chronic Situational  Controlled, stable Continue xanax 0.5 mg bid prn

## 2020-06-21 NOTE — Assessment & Plan Note (Signed)
Chronic She is exercising She has lost a little weight Continue weight loss efforts Discussed decreased portions

## 2020-06-21 NOTE — Assessment & Plan Note (Signed)
Chronic Check a1c Low sugar / carb diet Stressed regular exercise  

## 2020-06-21 NOTE — Assessment & Plan Note (Signed)
chronic No residual deficit Continue atorvastatin 40 mg daily, ASA 81 mg daily BP well controlled at home - slightly higher here continue regular walking

## 2020-06-21 NOTE — Assessment & Plan Note (Signed)
Chronic BP well controlled Continue amlodipine 10mg , avalide 150-12.5 mg  cmp

## 2020-06-21 NOTE — Assessment & Plan Note (Signed)
Chronic Check lipid panel  Continue atorvastatin 40 mg qd Regular exercise and healthy diet encouraged

## 2020-06-28 DIAGNOSIS — M545 Low back pain, unspecified: Secondary | ICD-10-CM | POA: Diagnosis not present

## 2020-06-30 DIAGNOSIS — M545 Low back pain, unspecified: Secondary | ICD-10-CM | POA: Diagnosis not present

## 2020-07-02 DIAGNOSIS — M545 Low back pain, unspecified: Secondary | ICD-10-CM | POA: Diagnosis not present

## 2020-07-05 DIAGNOSIS — M545 Low back pain, unspecified: Secondary | ICD-10-CM | POA: Diagnosis not present

## 2020-07-07 DIAGNOSIS — M545 Low back pain, unspecified: Secondary | ICD-10-CM | POA: Diagnosis not present

## 2020-07-09 DIAGNOSIS — M545 Low back pain, unspecified: Secondary | ICD-10-CM | POA: Diagnosis not present

## 2020-07-12 ENCOUNTER — Other Ambulatory Visit: Payer: Self-pay | Admitting: Internal Medicine

## 2020-07-12 DIAGNOSIS — M545 Low back pain, unspecified: Secondary | ICD-10-CM | POA: Diagnosis not present

## 2020-07-13 ENCOUNTER — Telehealth: Payer: Self-pay | Admitting: Pharmacist

## 2020-07-14 DIAGNOSIS — M545 Low back pain, unspecified: Secondary | ICD-10-CM | POA: Diagnosis not present

## 2020-07-14 NOTE — Progress Notes (Signed)
Chronic Care Management Pharmacy Assistant   Name: Shelley Hayes  MRN: [id number] DOB: 12-18-1945   Reason for Encounter: Hypertension Disease State Call   Conditions to be addressed/monitored: HTN   Recent office visits:  06/21/20 Dr. Quay Burow, no medication changes  Recent consult visits:  None ID  Hospital visits:  Medication Reconciliation was completed by comparing discharge summary, patient's EMR and Pharmacy list, and upon discussion with patient.  Admitted to the hospital on 02/27/20 due to hypertension. Discharge date was 02/27/20. Discharged from [facility]?Medications Started at [facility] Discharge:?? -started amlodipine 5 mg due to hypertension  Medication Changes at Hospital Discharge: -Changed None ID  Medications Discontinued at Hospital Discharge: -Stopped None ID  Medications that remain the same after Hospital Discharge:??  -All other medications will remain the same.    Medications: Outpatient Encounter Medications as of 07/13/2020  Medication Sig  . albuterol (VENTOLIN HFA) 108 (90 Base) MCG/ACT inhaler Inhale 2 puffs into the lungs every 6 (six) hours as needed for wheezing or shortness of breath.  . Alcohol Swabs (B-D SINGLE USE SWABS REGULAR) PADS USE AS DIRECTED  . ALPRAZolam (XANAX) 0.5 MG tablet Take 1 tablet (0.5 mg total) by mouth 2 (two) times daily as needed for anxiety or sleep.  Marland Kitchen amLODipine (NORVASC) 10 MG tablet Take 1 tablet (10 mg total) by mouth daily.  . Ascorbic Acid (VITAMIN C) 1000 MG tablet Take 2,000 mg by mouth daily.  Marland Kitchen aspirin 81 MG tablet Take 81 mg by mouth daily.  Marland Kitchen atorvastatin (LIPITOR) 40 MG tablet TAKE 1 TABLET EVERY DAY  . blood glucose meter kit and supplies KIT Dispense based on patient and insurance preference. Use up to four times daily as directed. R73.03.  Marland Kitchen Cholecalciferol (VITAMIN D3) 1000 UNITS CAPS Take 1 capsule (1,000 Units total) by mouth daily.  . hydrochlorothiazide (MICROZIDE) 12.5 MG  capsule TAKE 1 CAPSULE EVERY DAY  . irbesartan-hydrochlorothiazide (AVALIDE) 150-12.5 MG tablet Take 2 tablets by mouth daily. (Patient taking differently: Take 1 tablet by mouth daily.)  . latanoprost (XALATAN) 0.005 % ophthalmic solution PLACE 1 DROP INTO BOTH EYES AT BEDTIME.  . Multiple Vitamin (MULTIVITAMIN) tablet Take 1 tablet by mouth daily.  . potassium chloride SA (KLOR-CON) 20 MEQ tablet Take 1 tablet (20 mEq total) by mouth daily.  . vitamin B-12 (CYANOCOBALAMIN) 100 MCG tablet Vitamin B12  . vitamin B-12 (CYANOCOBALAMIN) 1000 MCG tablet Take 1 tablet (1,000 mcg total) by mouth daily.   No facility-administered encounter medications on file as of 07/13/2020.    Reviewed chart prior to disease state call. Spoke with patient regarding BP  Recent Office Vitals: BP Readings from Last 3 Encounters:  06/21/20 138/80  05/12/20 110/66  04/13/20 (!) 160/110   Pulse Readings from Last 3 Encounters:  06/21/20 76  05/12/20 85  04/13/20 81    Wt Readings from Last 3 Encounters:  06/21/20 205 lb (93 kg)  05/12/20 210 lb (95.3 kg)  04/13/20 208 lb (94.3 kg)     Kidney Function Lab Results  Component Value Date/Time   CREATININE 0.92 06/21/2020 09:24 AM   CREATININE 0.92 05/12/2020 11:05 AM   GFR 61.22 06/21/2020 09:24 AM   GFRNONAA 58 (L) 02/27/2020 05:50 PM    BMP Latest Ref Rng & Units 06/21/2020 05/12/2020 02/27/2020  Glucose 70 - 99 mg/dL 90 92 94  BUN 6 - 23 mg/dL $Remove'11 15 17  'OCLOgwF$ Creatinine 0.40 - 1.20 mg/dL 0.92 0.92 1.02(H)  Sodium 135 - 145 mEq/L 142 142 142  Potassium 3.5 - 5.1 mEq/L 3.8 3.7 3.7  Chloride 96 - 112 mEq/L 104 106 104  CO2 19 - 32 mEq/L $Remove'31 29 26  'IkfqDTW$ Calcium 8.4 - 10.5 mg/dL 9.7 9.7 9.4    . Current antihypertensive regimen:  Amlodipine 5 mg 1 tab daily and irbesartan-hctz  2 tabs daily  . How often are you checking your Blood Pressure? Patient states that she checks blood pressure twice daily  . Current home BP readings: Patient states that today blood  pressure was 112/73  . What recent interventions/DTPs have been made by any provider to improve Blood Pressure control since last CPP Visit: Patient to continue blood pressure meds  . Any recent hospitalizations or ED visits since last visit with CPP? Patient was seen at the ED at [facility] for htn in December  . What diet changes have been made to improve Blood Pressure Control?  Patient states that she has made some changes to diet like cutting out fried foods  . What exercise is being done to improve your Blood Pressure Control?  Patient states that she is doing physical therapy and is losing weight   Adherence Review: Is the patient currently on ACE/ARB medication?Yes, Irbesartan-hctz Does the patient have >5 day gap between last estimated fill dates? No  Star Rating Drugs: Atorvastatin 07/13/20 90 ds Irbesartan-hctz 04/07/20 90 ds  [place] [facility] Pharmacist Assistant [phone]  Time spent:30

## 2020-07-19 DIAGNOSIS — M545 Low back pain, unspecified: Secondary | ICD-10-CM | POA: Diagnosis not present

## 2020-07-20 NOTE — Progress Notes (Signed)
Made a call to patient this morning to confirm how she was taking medication, unable to reach patient, left message to return call.  [place] Pharmacist Assistant [phone]  Time spent:2

## 2020-07-21 DIAGNOSIS — H401134 Primary open-angle glaucoma, bilateral, indeterminate stage: Secondary | ICD-10-CM | POA: Diagnosis not present

## 2020-07-23 DIAGNOSIS — M545 Low back pain, unspecified: Secondary | ICD-10-CM | POA: Diagnosis not present

## 2020-07-26 DIAGNOSIS — M545 Low back pain, unspecified: Secondary | ICD-10-CM | POA: Diagnosis not present

## 2020-07-28 DIAGNOSIS — M545 Low back pain, unspecified: Secondary | ICD-10-CM | POA: Diagnosis not present

## 2020-07-30 DIAGNOSIS — M545 Low back pain, unspecified: Secondary | ICD-10-CM | POA: Diagnosis not present

## 2020-08-02 DIAGNOSIS — M545 Low back pain, unspecified: Secondary | ICD-10-CM | POA: Diagnosis not present

## 2020-08-04 DIAGNOSIS — M545 Low back pain, unspecified: Secondary | ICD-10-CM | POA: Diagnosis not present

## 2020-08-09 DIAGNOSIS — M545 Low back pain, unspecified: Secondary | ICD-10-CM | POA: Diagnosis not present

## 2020-08-11 ENCOUNTER — Telehealth: Payer: Medicare PPO

## 2020-08-11 ENCOUNTER — Telehealth: Payer: Self-pay | Admitting: Pharmacist

## 2020-08-11 DIAGNOSIS — M545 Low back pain, unspecified: Secondary | ICD-10-CM | POA: Diagnosis not present

## 2020-08-11 NOTE — Telephone Encounter (Signed)
  Chronic Care Management   Outreach Note  08/11/2020 Name: Shelley Hayes MRN: [id number] DOB: [birth year]  Referred by: Binnie Rail, MD  Patient had a phone appointment scheduled with clinical pharmacist today.  An unsuccessful telephone outreach was attempted today. The patient was referred to the pharmacist for assistance with care management and care coordination.   Need to clarify BP meds: Irbesartan-hctz 150-12.5 mg - 1 or 2 daily? HCTZ 12.5 mg - in addition to avalide? Amlodipine 5 or 10 mg?  If possible, a message was left to return call to: [phone] or to [facility]: [place], PharmD, Para [place], CPP Clinical Pharmacist [facility] [phone]

## 2020-08-11 NOTE — Progress Notes (Deleted)
Chronic Care Management Pharmacy Note  08/11/2020 Name:  Shelley Hayes MRN:  [id number] DOB:  [birth year]  Subjective: Shelley Hayes is an 75 y.o. year old female who is a primary patient of Burns, Claudina Lick, MD.  The CCM team was consulted for assistance with disease management and care coordination needs.    Engaged with patient by telephone for follow up visit in response to provider referral for pharmacy case management and/or care coordination services.   Consent to Services:  The patient was given information about Chronic Care Management services, agreed to services, and gave verbal consent prior to initiation of services.  Please see initial visit note for detailed documentation.   Patient Care Team: Binnie Rail, MD as PCP - General (Internal Medicine) Josue Hector, MD as PCP - Cardiology (Cardiology) Everlene Farrier, MD (Obstetrics and Gynecology) Victory Dakin, MD as Referring Physician (Optometry) Charlton Haws, [facility] as Pharmacist (Pharmacist)  Recent office visits: 06/21/20 Dr Quay Burow OV: chronic f/u; BP improved, 130/80 in clinic, 110/74 at home. Referred to PT for sciatic nerve/lumbar pain  04/13/20 Dr Quay Burow OV: chronic f/u, high BP. Start spironolactone 25 mg and increase amlodipine to 10 mg. Repeat BMP 1 week.  12/22/19 Dr Quay Burow OV: chronic f/u. Ordered DEXA. Pt does not like amlodipine, dc'd and started irbesartan-HCTZ 150-12.5 mg, stop potassium. LDL 197, advised to restart statin.  Recent consult visits: 02/13/20 colonoscopy  Hospital visits: ED 02/27/20 for hypertensive urgency  Objective:  Lab Results  Component Value Date   CREATININE 0.92 06/21/2020   BUN 11 06/21/2020   GFR 61.22 06/21/2020   GFRNONAA 58 (L) 02/27/2020   NA 142 06/21/2020   K 3.8 06/21/2020   CALCIUM 9.7 06/21/2020   CO2 31 06/21/2020    Lab Results  Component Value Date/Time   HGBA1C 5.7 06/21/2020 09:24 AM   HGBA1C 5.6 12/22/2019 10:00 AM   GFR 61.22 06/21/2020 09:24 AM    GFR 61.27 05/12/2020 11:05 AM    Last diabetic Eye exam:  Lab Results  Component Value Date/Time   HMDIABEYEEXA Normal 02/07/2010 12:00 AM    Last diabetic Foot exam: No results found for: HMDIABFOOTEX   Lab Results  Component Value Date   CHOL 159 06/21/2020   HDL 38.70 (L) 06/21/2020   LDLCALC 99 06/21/2020   LDLDIRECT 198.9 12/30/2012   TRIG 107.0 06/21/2020   CHOLHDL 4 06/21/2020    Hepatic Function Latest Ref Rng & Units 06/21/2020 02/27/2020 12/22/2019  Total Protein 6.0 - 8.3 g/dL 7.9 8.7(H) 8.7(H)  Albumin 3.5 - 5.2 g/dL 4.1 4.3 4.4  AST 0 - 37 U/L $Remo'20 27 23  'gWyZq$ ALT 0 - 35 U/L $Remo'13 14 14  'CUIro$ Alk Phosphatase 39 - 117 U/L 74 83 79  Total Bilirubin 0.2 - 1.2 mg/dL 0.7 0.1(L) 0.4  Bilirubin, Direct 0.0 - 0.3 mg/dL - - -    Lab Results  Component Value Date/Time   TSH 1.690 02/27/2020 05:56 PM   TSH 1.32 12/22/2019 10:00 AM   TSH 1.24 12/16/2018 11:35 AM    CBC Latest Ref Rng & Units 02/27/2020 12/22/2019 12/16/2018  WBC 4.0 - 10.5 K/uL 8.7 8.9 10.1  Hemoglobin 12.0 - 15.0 g/dL 14.2 13.7 13.7  Hematocrit 36.0 - 46.0 % 45.7 43.5 42.4  Platelets 150 - 400 K/uL 334 369.0 329.0    No results found for: VD25OH  Clinical ASCVD: No  The 10-year ASCVD risk score Mikey Bussing DC Jr., et al., 2013) is: 12%   Values  used to calculate the score:     Age: 37 years     Sex: Female     Is Non-Hispanic African American: Yes     Diabetic: No     Tobacco smoker: No     Systolic Blood Pressure: 138 mmHg     Is BP treated: Yes     HDL Cholesterol: 38.7 mg/dL     Total Cholesterol: 159 mg/dL    Depression screen [facility] 2/9 03/15/2020 12/22/2019 12/22/2019  Decreased Interest 0 0 0  Down, Depressed, Hopeless 0 0 0  PHQ - 2 Score 0 0 0  Altered sleeping - - -  Tired, decreased energy - - -  Change in appetite - - -  Feeling bad or failure about yourself  - - -  Trouble concentrating - - -  Moving slowly or fidgety/restless - - -  Suicidal thoughts - - -  PHQ-9 Score - - -  Difficult  doing work/chores - - -     Social History   Tobacco Use  Smoking Status Never Smoker  Smokeless Tobacco Never Used   BP Readings from Last 3 Encounters:  06/21/20 138/80  05/12/20 110/66  04/13/20 (!) 160/110   Pulse Readings from Last 3 Encounters:  06/21/20 76  05/12/20 85  04/13/20 81   Wt Readings from Last 3 Encounters:  06/21/20 205 lb (93 kg)  05/12/20 210 lb (95.3 kg)  04/13/20 208 lb (94.3 kg)    Assessment/Interventions: Review of patient past medical history, allergies, medications, health status, including review of consultants reports, laboratory and other test data, was performed as part of comprehensive evaluation and provision of chronic care management services.   SDOH:  (Social Determinants of Health) assessments and interventions performed: Yes   Pt lives with daughter, which has been an adjustment as she was previously used to living alone. She sometimes watches Youtube videos about medication side effects that worry her.   CCM Care Plan  Allergies  Allergen Reactions  . Hydrocodone Bit-Homatrop Mbr Other (See Comments)    "don't give me that stuff - i'll get hooked!"  . Losartan Other (See Comments)    palpitations  . Spironolactone Other (See Comments)    Fatigue/malaise    Medications Reviewed Today    Reviewed by Karma Ganja, CMA (Certified Medical Assistant) on 06/21/20 at 0829  Med List Status: <None>  Medication Order Taking? Sig Documenting Provider Last Dose Status Informant  albuterol (VENTOLIN HFA) 108 (90 Base) MCG/ACT inhaler [id number] Yes Inhale 2 puffs into the lungs every 6 (six) hours as needed for wheezing or shortness of breath. Pincus Sanes, MD Taking Active   Alcohol Swabs (B-D SINGLE USE SWABS REGULAR) PADS [id number] Yes USE AS DIRECTED Lawerance Bach, Bobette Mo, MD Taking Active   ALPRAZolam Prudy Feeler) 0.5 MG tablet [id number] Yes Take 1 tablet (0.5 mg total) by mouth 2 (two) times daily as needed for anxiety or sleep. Pincus Sanes,  MD Taking Active   amLODipine (NORVASC) 10 MG tablet [id number] Yes Take 1 tablet (10 mg total) by mouth daily. Pincus Sanes, MD Taking Active   Ascorbic Acid (VITAMIN C) 1000 MG tablet [id number] Yes Take 2,000 mg by mouth daily. [provider] Taking Active Self  aspirin 81 MG tablet [id number] Yes Take 81 mg by mouth daily. [provider] Taking Active   atorvastatin (LIPITOR) 40 MG tablet [id number] Yes Take 1 tablet (40 mg total) by mouth daily. Pincus Sanes, MD Taking Active  blood glucose meter kit and supplies KIT [id number] Yes Dispense based on patient and insurance preference. Use up to four times daily as directed. R73.03Binnie Rail, MD Taking Active   Cholecalciferol (VITAMIN D3) 1000 UNITS CAPS [id number] Yes Take 1 capsule (1,000 Units total) by mouth daily. Rowe Clack, MD Taking Active   irbesartan-hydrochlorothiazide (AVALIDE) 150-12.5 MG tablet [id number] Yes Take 2 tablets by mouth daily.  Patient taking differently: Take 1 tablet by mouth daily.   Binnie Rail, MD Taking Active   latanoprost (XALATAN) 0.005 % ophthalmic solution [id number] Yes PLACE 1 DROP INTO BOTH EYES AT BEDTIME. Binnie Rail, MD Taking Active   Multiple Vitamin (MULTIVITAMIN) tablet [id number] Yes Take 1 tablet by mouth daily. [provider] Taking Active   potassium chloride SA (KLOR-CON) 20 MEQ tablet [id number] Yes Take 1 tablet (20 mEq total) by mouth daily. Binnie Rail, MD Taking Active   vitamin B-12 (CYANOCOBALAMIN) 100 MCG tablet [id number] Yes Vitamin B12 [provider] Taking Active   vitamin B-12 (CYANOCOBALAMIN) 1000 MCG tablet [id number] Yes Take 1 tablet (1,000 mcg total) by mouth daily. Rowe Clack, MD Taking Active           Patient Active Problem List   Diagnosis Date Noted  . History of lacunar cerebrovascular accident (CVA) 06/20/2020  . Morbid obesity ([place]) 02/24/2020  . BPPV (benign paroxysmal positional vertigo)  11/30/2017  . Dysphagia 07/26/2017  . Incomplete emptying of bladder 07/25/2017  . Chest pain 04/24/2017  . Prediabetes 04/24/2017  . Herpes zoster without complication 32/35/5732  . Anxiety 04/24/2017  . Muscle cramps 06/19/2016  . Osteopenia 04/26/2016  . Fluttering heart 12/23/2015  . Cervical paraspinal muscle spasm 12/23/2015  . SCIATICA, LEFT 01/20/2010  . Hyperlipidemia 08/23/2009  . GLUCOMA 08/23/2009  . Hypertension 08/23/2009  . Allergic rhinitis 08/23/2009  . ARTHRITIS 08/23/2009  . KNEE PAIN 08/23/2009    Immunization History  Administered Date(s) Administered  . Fluad Quad(high Dose 65+) 12/16/2018, 12/23/2019  . Influenza Whole 01/20/2010  . Influenza, High Dose Seasonal PF 12/30/2012, 02/26/2015, 02/29/2016, 02/06/2017, 03/27/2018  . Influenza,inj,Quad PF,6+ Mos 02/23/2014  . Janssen (J&J) SARS-COV-2 Vaccination 06/13/2019  . Pneumococcal Conjugate-13 02/29/2016  . Pneumococcal Polysaccharide-23 02/23/2014  . Tetanus 10/25/2011    Conditions to be addressed/monitored:  Hypertension and Hyperlipidemia  Patient Care Plan: CCM Pharmacy Care Plan    Problem Identified: Hypertension and Hyperlipidemia   Priority: High    Long-Range Goal: Disease management   Start Date: 05/11/2020  Expected End Date: 11/11/2020  This Visit's Progress: On track  Priority: High  Note:   Current Barriers:  . Unable to independently monitor therapeutic efficacy . Unable to maintain control of blood pressure  Pharmacist Clinical Goal(s):  Marland Kitchen Over the next 90 days, patient will achieve adherence to monitoring guidelines and medication adherence to achieve therapeutic efficacy . maintain control of blood pressure as evidenced by improved home readings  through collaboration with PharmD and provider.   Interventions: . 1:1 collaboration with Binnie Rail, MD regarding development and update of comprehensive plan of care as evidenced by provider attestation and  co-signature . Inter-disciplinary care team collaboration (see longitudinal plan of care) . Comprehensive medication review performed; medication list updated in electronic medical record  Hypertension (BP goal < 140/90) Not ideally controlled- home readings 137/83, 143/86, 149/86 133/89, 139/9, 153/95, 147/95, 126/72 152/100, 152/92, 117/79, 145/92, 142/91, 139/89.  -pt stopped taking spironolactone after 3 days due to general malaise/fatigue. Pt has lab  appt for BMP tomorrow Current regimen:  ? Amlodipine 10 mg daily  ? Irbesartan-HCTZ 150-12.5 mg - 1 tab daily (previously was taking 2 daily but she thought it wasn't helping BP and she is worried it is hurting her kidneys) Interventions: ? Discussed BP goals and benefits of medications for prevention of heart attack / stroke ? Counseled on benefits of exercise - pt is walking 2 miles (1 hour) every day ? Educated about damaging effects of high BP, especially on kidney function ? Recommend to continue current medication and keep lab appt tomorrow   Hyperlipidemia (LDL goal < 130) Uncontrolled - most recent LDL 197, pt was not taking statin for several months prior. Now she has restarted. Current regimen:  ? Atorvastatin 40 mg daily Interventions: ? Discussed cholesterol goals and benefits of medications for prevention of heart attack / stroke ? Recommend to continue current medication  Patient Goals/Self-Care Activities . Over the next 90 days, patient will:  - take medications as prescribed focus on medication adherence by pill box check blood pressure daily, document, and provide at future appointments target a minimum of 150 minutes of moderate intensity exercise weekly engage in dietary modifications by limiting salt to < 2000 mg/day  Follow Up Plan: Telephone follow up appointment with care management team member scheduled for: 3 months      Medication Assistance: None required.  Patient affirms current coverage meets  needs.  Compliance/Adherence/Medication fill history: Care Gaps: Shingrix Covid booster  Star-Rating Drugs: ***  Patient's preferred pharmacy is:  [facility] [address], [place] [address] Phone: [phone] Fax: [phone]  [place], [place] [address] Phone: [phone] Fax: [phone]  Uses pill box? Yes Pt endorses 100% compliance  We discussed: Current pharmacy is preferred with insurance plan and patient is satisfied with pharmacy services Patient decided to: Continue current medication management strategy  Care Plan and Follow Up Patient Decision:  Patient agrees to Care Plan and Follow-up.  Plan: Telephone follow up appointment with care management team member scheduled for:  3 months  Charlene Brooke, PharmD, [place], CPP Clinical Pharmacist [facility] [phone]

## 2020-08-13 DIAGNOSIS — M545 Low back pain, unspecified: Secondary | ICD-10-CM | POA: Diagnosis not present

## 2020-08-18 DIAGNOSIS — M545 Low back pain, unspecified: Secondary | ICD-10-CM | POA: Diagnosis not present

## 2020-08-19 ENCOUNTER — Telehealth: Payer: Medicare PPO

## 2020-08-19 ENCOUNTER — Telehealth: Payer: Self-pay | Admitting: Internal Medicine

## 2020-08-19 NOTE — Telephone Encounter (Signed)
° ° °  Please return call to patient °

## 2020-08-19 NOTE — Progress Notes (Deleted)
Chronic Care Management Pharmacy Note  08/19/2020 Name:  Shelley Hayes MRN:  [id number] DOB:  November 29, 1945  Summary:   Recommendations/Changes made from today's visit:   Plan:    Subjective: Shelley Hayes is an 75 y.o. year old female who is a primary patient of Burns, Claudina Lick, MD.  The CCM team was consulted for assistance with disease management and care coordination needs.    Engaged with patient by telephone for follow up visit in response to provider referral for pharmacy case management and/or care coordination services.   Consent to Services:  The patient was given information about Chronic Care Management services, agreed to services, and gave verbal consent prior to initiation of services.  Please see initial visit note for detailed documentation.   Patient Care Team: Binnie Rail, MD as PCP - General (Internal Medicine) Josue Hector, MD as PCP - Cardiology (Cardiology) Everlene Farrier, MD (Obstetrics and Gynecology) Victory Dakin, MD as Referring Physician (Optometry) Charlton Haws, [facility] as Pharmacist (Pharmacist)  Recent office visits: 06/21/20 Dr Quay Burow OV: chronic f/u; BP improved, 130/80 in clinic, 110/74 at home. Referred to PT for sciatic nerve/lumbar pain  04/13/20 Dr Quay Burow OV: chronic f/u, high BP. Start spironolactone 25 mg and increase amlodipine to 10 mg. Repeat BMP 1 week.  12/22/19 Dr Quay Burow OV: chronic f/u. Ordered DEXA. Pt does not like amlodipine, dc'd and started irbesartan-HCTZ 150-12.5 mg, stop potassium. LDL 197, advised to restart statin.  Recent consult visits: 02/13/20 colonoscopy  Hospital visits: ED 02/27/20 for hypertensive urgency  Objective:  Lab Results  Component Value Date   CREATININE 0.92 06/21/2020   BUN 11 06/21/2020   GFR 61.22 06/21/2020   GFRNONAA 58 (L) 02/27/2020   NA 142 06/21/2020   K 3.8 06/21/2020   CALCIUM 9.7 06/21/2020   CO2 31 06/21/2020    Lab Results  Component Value Date/Time   HGBA1C 5.7  06/21/2020 09:24 AM   HGBA1C 5.6 12/22/2019 10:00 AM   GFR 61.22 06/21/2020 09:24 AM   GFR 61.27 05/12/2020 11:05 AM    Last diabetic Eye exam:  Lab Results  Component Value Date/Time   HMDIABEYEEXA Normal 02/07/2010 12:00 AM    Last diabetic Foot exam: No results found for: HMDIABFOOTEX   Lab Results  Component Value Date   CHOL 159 06/21/2020   HDL 38.70 (L) 06/21/2020   LDLCALC 99 06/21/2020   LDLDIRECT 198.9 12/30/2012   TRIG 107.0 06/21/2020   CHOLHDL 4 06/21/2020    Hepatic Function Latest Ref Rng & Units 06/21/2020 02/27/2020 12/22/2019  Total Protein 6.0 - 8.3 g/dL 7.9 8.7(H) 8.7(H)  Albumin 3.5 - 5.2 g/dL 4.1 4.3 4.4  AST 0 - 37 U/L $Remo'20 27 23  'aLQoY$ ALT 0 - 35 U/L $Remo'13 14 14  'jRNsb$ Alk Phosphatase 39 - 117 U/L 74 83 79  Total Bilirubin 0.2 - 1.2 mg/dL 0.7 0.1(L) 0.4  Bilirubin, Direct 0.0 - 0.3 mg/dL - - -    Lab Results  Component Value Date/Time   TSH 1.690 02/27/2020 05:56 PM   TSH 1.32 12/22/2019 10:00 AM   TSH 1.24 12/16/2018 11:35 AM    CBC Latest Ref Rng & Units 02/27/2020 12/22/2019 12/16/2018  WBC 4.0 - 10.5 K/uL 8.7 8.9 10.1  Hemoglobin 12.0 - 15.0 g/dL 14.2 13.7 13.7  Hematocrit 36.0 - 46.0 % 45.7 43.5 42.4  Platelets 150 - 400 K/uL 334 369.0 329.0    No results found for: VD25OH  Clinical ASCVD: No  The 10-year  ASCVD risk score Mikey Bussing DC Jr., et al., 2013) is: 12%   Values used to calculate the score:     Age: 26 years     Sex: Female     Is Non-Hispanic African American: Yes     Diabetic: No     Tobacco smoker: No     Systolic Blood Pressure: 161 mmHg     Is BP treated: Yes     HDL Cholesterol: 38.7 mg/dL     Total Cholesterol: 159 mg/dL    Depression screen [facility] 2/9 03/15/2020 12/22/2019 12/22/2019  Decreased Interest 0 0 0  Down, Depressed, Hopeless 0 0 0  PHQ - 2 Score 0 0 0  Altered sleeping - - -  Tired, decreased energy - - -  Change in appetite - - -  Feeling bad or failure about yourself  - - -  Trouble concentrating - - -  Moving slowly  or fidgety/restless - - -  Suicidal thoughts - - -  PHQ-9 Score - - -  Difficult doing work/chores - - -     Social History   Tobacco Use  Smoking Status Never  Smokeless Tobacco Never   BP Readings from Last 3 Encounters:  06/21/20 138/80  05/12/20 110/66  04/13/20 (!) 160/110   Pulse Readings from Last 3 Encounters:  06/21/20 76  05/12/20 85  04/13/20 81   Wt Readings from Last 3 Encounters:  06/21/20 205 lb (93 kg)  05/12/20 210 lb (95.3 kg)  04/13/20 208 lb (94.3 kg)    Assessment/Interventions: Review of patient past medical history, allergies, medications, health status, including review of consultants reports, laboratory and other test data, was performed as part of comprehensive evaluation and provision of chronic care management services.   SDOH:  (Social Determinants of Health) assessments and interventions performed: Yes   Pt lives with daughter, which has been an adjustment as she was previously used to living alone. She sometimes watches Youtube videos about medication side effects that worry her.   CCM Care Plan  Allergies  Allergen Reactions   Hydrocodone Bit-Homatrop Mbr Other (See Comments)    "don't give me that stuff - i'll get hooked!"   Losartan Other (See Comments)    palpitations   Spironolactone Other (See Comments)    Fatigue/malaise    Medications Reviewed Today     Reviewed by Marcina Millard, CMA (Certified Medical Assistant) on 06/21/20 at [place] List Status: <None>   Medication Order Taking? Sig Documenting Provider Last Dose Status Informant  albuterol (VENTOLIN HFA) 108 (90 Base) MCG/ACT inhaler [id number] Yes Inhale 2 puffs into the lungs every 6 (six) hours as needed for wheezing or shortness of breath. Binnie Rail, MD Taking Active   Alcohol Swabs (B-D SINGLE USE SWABS REGULAR) PADS [id number] Yes USE AS DIRECTED Quay Burow, Claudina Lick, MD Taking Active   ALPRAZolam Duanne Moron) 0.5 MG tablet [id number] Yes Take 1 tablet (0.5 mg total) by  mouth 2 (two) times daily as needed for anxiety or sleep. Binnie Rail, MD Taking Active   amLODipine (NORVASC) 10 MG tablet [id number] Yes Take 1 tablet (10 mg total) by mouth daily. Binnie Rail, MD Taking Active   Ascorbic Acid (VITAMIN C) 1000 MG tablet [id number] Yes Take 2,000 mg by mouth daily. [provider] Taking Active Self  aspirin 81 MG tablet [id number] Yes Take 81 mg by mouth daily. [provider] Taking Active   atorvastatin (LIPITOR) 40 MG tablet [id number] Yes Take 1  tablet (40 mg total) by mouth daily. Binnie Rail, MD Taking Active   blood glucose meter kit and supplies KIT [id number] Yes Dispense based on patient and insurance preference. Use up to four times daily as directed. R73.03Binnie Rail, MD Taking Active   Cholecalciferol (VITAMIN D3) 1000 UNITS CAPS [id number] Yes Take 1 capsule (1,000 Units total) by mouth daily. Rowe Clack, MD Taking Active   irbesartan-hydrochlorothiazide (AVALIDE) 150-12.5 MG tablet [id number] Yes Take 2 tablets by mouth daily.  Patient taking differently: Take 1 tablet by mouth daily.   Binnie Rail, MD Taking Active   latanoprost (XALATAN) 0.005 % ophthalmic solution [id number] Yes PLACE 1 DROP INTO BOTH EYES AT BEDTIME. Binnie Rail, MD Taking Active   Multiple Vitamin (MULTIVITAMIN) tablet [id number] Yes Take 1 tablet by mouth daily. [provider] Taking Active   potassium chloride SA (KLOR-CON) 20 MEQ tablet [id number] Yes Take 1 tablet (20 mEq total) by mouth daily. Binnie Rail, MD Taking Active   vitamin B-12 (CYANOCOBALAMIN) 100 MCG tablet [id number] Yes Vitamin B12 [provider] Taking Active   vitamin B-12 (CYANOCOBALAMIN) 1000 MCG tablet [id number] Yes Take 1 tablet (1,000 mcg total) by mouth daily. Rowe Clack, MD Taking Active             Patient Active Problem List   Diagnosis Date Noted   History of lacunar cerebrovascular accident (CVA) 06/20/2020   Morbid  obesity ([place]) 02/24/2020   BPPV (benign paroxysmal positional vertigo) 11/30/2017   Dysphagia 07/26/2017   Incomplete emptying of bladder 07/25/2017   Chest pain 04/24/2017   Prediabetes 04/24/2017   Herpes zoster without complication 87/56/4332   Anxiety 04/24/2017   Muscle cramps 06/19/2016   Osteopenia 04/26/2016   Fluttering heart 12/23/2015   Cervical paraspinal muscle spasm 12/23/2015   SCIATICA, LEFT 01/20/2010   Hyperlipidemia 08/23/2009   GLUCOMA 08/23/2009   Hypertension 08/23/2009   Allergic rhinitis 08/23/2009   ARTHRITIS 08/23/2009   KNEE PAIN 08/23/2009    Immunization History  Administered Date(s) Administered   Fluad Quad(high Dose 65+) 12/16/2018, 12/23/2019   Influenza Whole 01/20/2010   Influenza, High Dose Seasonal PF 12/30/2012, 02/26/2015, 02/29/2016, 02/06/2017, 03/27/2018   Influenza,inj,Quad PF,6+ Mos 02/23/2014   Janssen (J&J) SARS-COV-2 Vaccination 06/13/2019   Pneumococcal Conjugate-13 02/29/2016   Pneumococcal Polysaccharide-23 02/23/2014   Tetanus 10/25/2011    Conditions to be addressed/monitored:  Hypertension and Hyperlipidemia  Patient Care Plan: CCM Pharmacy Care Plan     Problem Identified: Hypertension and Hyperlipidemia   Priority: High     Long-Range Goal: Disease management   Start Date: 05/11/2020  Expected End Date: 11/11/2020  This Visit's Progress: On track  Priority: High  Note:   Current Barriers:  Unable to independently monitor therapeutic efficacy Unable to maintain control of blood pressure  Pharmacist Clinical Goal(s):  Over the next 90 days, patient will achieve adherence to monitoring guidelines and medication adherence to achieve therapeutic efficacy maintain control of blood pressure as evidenced by improved home readings  through collaboration with PharmD and provider.   Interventions: 1:1 collaboration with Binnie Rail, MD regarding development and update of comprehensive plan of care as evidenced by  provider attestation and co-signature Inter-disciplinary care team collaboration (see longitudinal plan of care) Comprehensive medication review performed; medication list updated in electronic medical record  Hypertension (BP goal < 140/90) Not ideally controlled- home readings 137/83, 143/86, 149/86 133/89, 139/9, 153/95, 147/95, 126/72 152/100, 152/92, 117/79, 145/92, 142/91, 139/89.  -pt stopped  taking spironolactone after 3 days due to general malaise/fatigue. Pt has lab appt for BMP tomorrow Current regimen:  Amlodipine 10 mg daily  Irbesartan-HCTZ 150-12.5 mg - 1 tab daily (previously was taking 2 daily but she thought it wasn't helping BP and she is worried it is hurting her kidneys) Interventions: Discussed BP goals and benefits of medications for prevention of heart attack / stroke Counseled on benefits of exercise - pt is walking 2 miles (1 hour) every day Educated about damaging effects of high BP, especially on kidney function Recommend to continue current medication and keep lab appt tomorrow   Hyperlipidemia (LDL goal < 130) Uncontrolled - most recent LDL 197, pt was not taking statin for several months prior. Now she has restarted. Current regimen:  Atorvastatin 40 mg daily Interventions: Discussed cholesterol goals and benefits of medications for prevention of heart attack / stroke Recommend to continue current medication  Patient Goals/Self-Care Activities Over the next 90 days, patient will:  - take medications as prescribed focus on medication adherence by pill box check blood pressure daily, document, and provide at future appointments target a minimum of 150 minutes of moderate intensity exercise weekly engage in dietary modifications by limiting salt to < 2000 mg/day  Follow Up Plan: Telephone follow up appointment with care management team member scheduled for: 3 months      Medication Assistance: None required.  Patient affirms current coverage meets  needs.  Compliance/Adherence/Medication fill history: Care Gaps: Shingrix Covid booster  Star-Rating Drugs: Atorvastatin - no fill date Irbesatan-HCTZ - no fill date  Patient's preferred pharmacy is:  [facility] [address], [place] [address] Phone: [phone] Fax: [phone]  [place], [place] [address] Phone: [phone] Fax: [phone]  Uses pill box? Yes Pt endorses 100% compliance  We discussed: Current pharmacy is preferred with insurance plan and patient is satisfied with pharmacy services Patient decided to: Continue current medication management strategy  Care Plan and Follow Up Patient Decision:  Patient agrees to Care Plan and Follow-up.  Plan: Telephone follow up appointment with care management team member scheduled for:  3 months  Charlene Brooke, PharmD, [place], CPP Clinical Pharmacist [facility] at [facility] [phone]

## 2020-08-19 NOTE — Telephone Encounter (Signed)
  Chronic Care Management   Outreach Note  08/19/2020 Name: KRISHNA HEUER MRN: [id number] DOB: [birth year]  Referred by: Binnie Rail, MD  Patient had a phone appointment scheduled with clinical pharmacist today.  An unsuccessful telephone outreach was attempted today. The patient was referred to the pharmacist for assistance with care management and care coordination.   Patient will NOT be penalized in any way for missing a CCM appointment. The no-show fee does not apply.  If possible, a message was left to return call to: [phone] or to [facility]: [place], PharmD, Para [place], CPP Clinical Pharmacist [facility] at [facility] [phone]

## 2020-08-20 NOTE — Telephone Encounter (Signed)
Called patient back, LVM.

## 2020-08-23 DIAGNOSIS — M545 Low back pain, unspecified: Secondary | ICD-10-CM | POA: Diagnosis not present

## 2020-08-25 DIAGNOSIS — M545 Low back pain, unspecified: Secondary | ICD-10-CM | POA: Diagnosis not present

## 2020-08-27 DIAGNOSIS — M545 Low back pain, unspecified: Secondary | ICD-10-CM | POA: Diagnosis not present

## 2020-08-30 DIAGNOSIS — M545 Low back pain, unspecified: Secondary | ICD-10-CM | POA: Diagnosis not present

## 2020-09-01 DIAGNOSIS — M545 Low back pain, unspecified: Secondary | ICD-10-CM | POA: Diagnosis not present

## 2020-09-03 DIAGNOSIS — M545 Low back pain, unspecified: Secondary | ICD-10-CM | POA: Diagnosis not present

## 2020-10-04 ENCOUNTER — Telehealth: Payer: Self-pay | Admitting: Pharmacist

## 2020-10-04 NOTE — Progress Notes (Signed)
Chronic Care Management Pharmacy Assistant   Name: Shelley Hayes  MRN: [id number] DOB: [birth year]   Reason for Encounter: Disease State   Conditions to be addressed/monitored: HTN   Recent office visits:  None ID  Recent consult visits:  None ID  Hospital visits:  None in previous 6 months  Medications: Outpatient Encounter Medications as of 10/04/2020  Medication Sig   albuterol (VENTOLIN HFA) 108 (90 Base) MCG/ACT inhaler Inhale 2 puffs into the lungs every 6 (six) hours as needed for wheezing or shortness of breath.   Alcohol Swabs (B-D SINGLE USE SWABS REGULAR) PADS USE AS DIRECTED   ALPRAZolam (XANAX) 0.5 MG tablet Take 1 tablet (0.5 mg total) by mouth 2 (two) times daily as needed for anxiety or sleep.   amLODipine (NORVASC) 10 MG tablet Take 1 tablet (10 mg total) by mouth daily.   Ascorbic Acid (VITAMIN C) 1000 MG tablet Take 2,000 mg by mouth daily.   aspirin 81 MG tablet Take 81 mg by mouth daily.   atorvastatin (LIPITOR) 40 MG tablet TAKE 1 TABLET EVERY DAY   blood glucose meter kit and supplies KIT Dispense based on patient and insurance preference. Use up to four times daily as directed. R73.03.   Cholecalciferol (VITAMIN D3) 1000 UNITS CAPS Take 1 capsule (1,000 Units total) by mouth daily.   hydrochlorothiazide (MICROZIDE) 12.5 MG capsule TAKE 1 CAPSULE EVERY DAY   irbesartan-hydrochlorothiazide (AVALIDE) 150-12.5 MG tablet Take 2 tablets by mouth daily. (Patient taking differently: Take 1 tablet by mouth daily.)   latanoprost (XALATAN) 0.005 % ophthalmic solution PLACE 1 DROP INTO BOTH EYES AT BEDTIME.   Multiple Vitamin (MULTIVITAMIN) tablet Take 1 tablet by mouth daily.   potassium chloride SA (KLOR-CON) 20 MEQ tablet Take 1 tablet (20 mEq total) by mouth daily.   vitamin B-12 (CYANOCOBALAMIN) 100 MCG tablet Vitamin B12   vitamin B-12 (CYANOCOBALAMIN) 1000 MCG tablet Take 1 tablet (1,000 mcg total) by mouth daily.   No facility-administered encounter  medications on file as of 10/04/2020.    Reviewed chart prior to disease state call. Spoke with patient regarding BP  Recent Office Vitals: BP Readings from Last 3 Encounters:  06/21/20 138/80  05/12/20 110/66  04/13/20 (!) 160/110   Pulse Readings from Last 3 Encounters:  06/21/20 76  05/12/20 85  04/13/20 81    Wt Readings from Last 3 Encounters:  06/21/20 205 lb (93 kg)  05/12/20 210 lb (95.3 kg)  04/13/20 208 lb (94.3 kg)     Kidney Function Lab Results  Component Value Date/Time   CREATININE 0.92 06/21/2020 09:24 AM   CREATININE 0.92 05/12/2020 11:05 AM   GFR 61.22 06/21/2020 09:24 AM   GFRNONAA 58 (L) 02/27/2020 05:50 PM    BMP Latest Ref Rng & Units 06/21/2020 05/12/2020 02/27/2020  Glucose 70 - 99 mg/dL 90 92 94  BUN 6 - 23 mg/dL _0 Creatinine 0.40 - 1.20 mg/dL 0.92 0.92 1.02(H)  Sodium 135 - 145 mEq/L 142 142 142  Potassium 3.5 - 5.1 mEq/L 3.8 3.7 3.7  Chloride 96 - 112 mEq/L 104 106 104  CO2 19 - 32 mEq/L _1 Calcium 8.4 - 10.5 mg/dL 9.7 9.7 9.4   Pharmacist Review Made 3 attempts to call patient for hypertension adherence call. Left message for patient to call back. Unable to reach.   Star Rating Drugs: Atorvastatin 40 mg 07/13/20 90 ds Irbesartan-hctz 150-12.5 mg 04/07/20 90 ds  Ethelene Hal Clinical Pharmacist Assistant  [phone]   Time spent:18

## 2020-11-16 DIAGNOSIS — H401234 Low-tension glaucoma, bilateral, indeterminate stage: Secondary | ICD-10-CM | POA: Diagnosis not present

## 2020-11-23 ENCOUNTER — Other Ambulatory Visit: Payer: Self-pay | Admitting: Internal Medicine

## 2020-12-19 NOTE — Progress Notes (Signed)
Subjective:    Patient ID: Shelley Hayes, female    DOB: [birth year], 75 y.o.   MRN: [id number]   This visit occurred during the SARS-CoV-2 public health emergency.  Safety protocols were in place, including screening questions prior to the visit, additional usage of staff PPE, and extensive cleaning of exam room while observing appropriate contact time as indicated for disinfecting solutions.    HPI She is here for a physical exam.   She fell in may or June going into PT - since then has been leaning to the right when walking.  She still has lower back pain  b/l - PT did not help much.  She would like her back pain further evaluated.  She is also very concerned that she is leaning to the right and she feels more off balance.  She otherwise is doing well.  She is walking regularly and has lost weight.  She has improved her diet.   Medications and allergies reviewed with patient and updated if appropriate.  Patient Active Problem List   Diagnosis Date Noted   History of lacunar cerebrovascular accident (CVA) 06/20/2020   Morbid obesity ([place]) 02/24/2020   BPPV (benign paroxysmal positional vertigo) 11/30/2017   Dysphagia 07/26/2017   Incomplete emptying of bladder 07/25/2017   Chest pain 04/24/2017   Prediabetes 04/24/2017   Herpes zoster without complication 46/50/3546   Anxiety 04/24/2017   Muscle cramps 06/19/2016   Osteopenia 04/26/2016   Fluttering heart 12/23/2015   Cervical paraspinal muscle spasm 12/23/2015   SCIATICA, LEFT 01/20/2010   Hyperlipidemia 08/23/2009   GLUCOMA 08/23/2009   Hypertension 08/23/2009   Allergic rhinitis 08/23/2009   ARTHRITIS 08/23/2009   KNEE PAIN 08/23/2009    Current Outpatient Medications on File Prior to Visit  Medication Sig Dispense Refill   albuterol (VENTOLIN HFA) 108 (90 Base) MCG/ACT inhaler Inhale 2 puffs into the lungs every 6 (six) hours as needed for wheezing or shortness of breath. 18 g 5   Alcohol Swabs (B-D SINGLE USE  SWABS REGULAR) PADS USE AS DIRECTED 100 each 1   ALPRAZolam (XANAX) 0.5 MG tablet Take 1 tablet (0.5 mg total) by mouth 2 (two) times daily as needed for anxiety or sleep. 30 tablet 0   amLODipine (NORVASC) 10 MG tablet TAKE 1 TABLET EVERY DAY 90 tablet 1   Ascorbic Acid (VITAMIN C) 1000 MG tablet Take 2,000 mg by mouth daily.     aspirin 81 MG tablet Take 81 mg by mouth daily.     atorvastatin (LIPITOR) 40 MG tablet TAKE 1 TABLET EVERY DAY 90 tablet 0   blood glucose meter kit and supplies KIT Dispense based on patient and insurance preference. Use up to four times daily as directed. R73.03. 1 each 0   Cholecalciferol (VITAMIN D3) 1000 UNITS CAPS Take 1 capsule (1,000 Units total) by mouth daily. 30 capsule    hydrochlorothiazide (MICROZIDE) 12.5 MG capsule TAKE 1 CAPSULE EVERY DAY 90 capsule 1   irbesartan-hydrochlorothiazide (AVALIDE) 150-12.5 MG tablet TAKE 1 TABLET EVERY DAY 90 tablet 1   latanoprost (XALATAN) 0.005 % ophthalmic solution PLACE 1 DROP INTO BOTH EYES AT BEDTIME. 2.5 mL 0   Multiple Vitamin (MULTIVITAMIN) tablet Take 1 tablet by mouth daily.     vitamin B-12 (CYANOCOBALAMIN) 1000 MCG tablet Take 1 tablet (1,000 mcg total) by mouth daily.     No current facility-administered medications on file prior to visit.    Past Medical History:  Diagnosis Date   ALLERGIC RHINITIS  Allergic rhinitis 08/23/2009   Allergy    Anxiety    ARTHRITIS    Arthritis    Blood in stool 04/24/2017   Cataract    bil cataracts removes   Cervical paraspinal muscle spasm 12/23/2015   Chest pain 04/24/2017   CHEST PAIN 12/19/2009   Qualifier: Diagnosis of  By: Marca Ancona RMA, Lucy     COLONIC POLYPS, HX OF 08/23/2009   Qualifier: Diagnosis of  By: Marca Ancona RMA, Lucy     Cough 06/19/2016   Fluttering heart 12/23/2015   GERD (gastroesophageal reflux disease)    Glaucoma    GLUCOMA    Heart murmur    Herpes zoster without complication 0/16/5537   Hyperglycemia 06/19/2016   HYPERLIPIDEMIA     HYPERTENSION    KNEE PAIN 08/23/2009   Qualifier: Diagnosis of  By: Asa Lente MD, Mateo Flow A    Muscle cramps 06/19/2016   Osteopenia 04/26/2016   2/18 - dexa - RFN and LFN  -1.2   OTITIS EXTERNA, RIGHT 01/20/2010   Qualifier: Diagnosis of  By: Asa Lente MD, Jannifer Rodney    Prediabetes 04/24/2017   SCIATICA, LEFT 11/2010    Past Surgical History:  Procedure Laterality Date   BREAST BIOPSY Right    benign   COLONOSCOPY     ESOPHAGOGASTRODUODENOSCOPY     POLYPECTOMY     TUBAL LIGATION     UPPER GASTROINTESTINAL ENDOSCOPY      Social History   Socioeconomic History   Marital status: Divorced    Spouse name: Not on file   Number of children: 2   Years of education: Not on file   Highest education level: Not on file  Occupational History   Occupation: Retired  Tobacco Use   Smoking status: Never   Smokeless tobacco: Never  Vaping Use   Vaping Use: Never used  Substance and Sexual Activity   Alcohol use: No    Alcohol/week: 0.0 standard drinks   Drug use: No   Sexual activity: Not Currently  Other Topics Concern   Not on file  Social History Narrative   Divorced, lives alone- in [place]yr [place]      Walking regularly, every morning   Social Determinants of Health   Financial Resource Strain: Low Risk    Difficulty of Paying Living Expenses: Not hard at all  Food Insecurity: No Food Insecurity   Worried About RCharity fundraiserin the Last Year: Never true   RArboriculturistin the Last Year: Never true  Transportation Needs: No Transportation Needs   Lack of Transportation (Medical): No   Lack of Transportation (Non-Medical): No  Physical Activity: Inactive   Days of Exercise per Week: 0 days   Minutes of Exercise per Session: 0 min  Stress: No Stress Concern Present   Feeling of Stress : Not at all  Social Connections: Not on file    Family History  Problem Relation Age of Onset   Arthritis Mother    Diabetes Mother    Colon  cancer Mother 815  Colon polyps Mother    Arthritis Father    Alcohol abuse Brother    Lung cancer Brother    Prostate cancer Brother    Diabetes Brother    Hypertension Brother    Lung cancer Other        Neice   Esophageal cancer Neg Hx    Liver cancer Neg Hx    Pancreatic cancer Neg  Hx    Rectal cancer Neg Hx    Stomach cancer Neg Hx     Review of Systems  Constitutional:  Negative for chills and fever.  Eyes:  Negative for visual disturbance.  Respiratory:  Negative for cough, shortness of breath and wheezing.   Cardiovascular:  Negative for chest pain, palpitations and leg swelling.  Gastrointestinal:  Negative for abdominal pain, blood in stool, constipation, diarrhea and nausea.       No gerd  Genitourinary:  Negative for dysuria.  Musculoskeletal:  Positive for arthralgias and back pain.  Skin:  Negative for color change and rash.  Neurological:  Positive for light-headedness (if she gets up too fast). Negative for headaches.  Psychiatric/Behavioral:  Negative for dysphoric mood. The patient is not nervous/anxious.       Objective:   Vitals:   12/22/20 0851  BP: 134/78  Pulse: 74  Temp: 98.8 F (37.1 C)  SpO2: 98%   Filed Weights   12/22/20 0851  Weight: 196 lb (88.9 kg)   Body mass index is 42.41 kg/m.  BP Readings from Last 3 Encounters:  12/22/20 134/78  06/21/20 138/80  05/12/20 110/66    Wt Readings from Last 3 Encounters:  12/22/20 196 lb (88.9 kg)  06/21/20 205 lb (93 kg)  05/12/20 210 lb (95.3 kg)     Physical Exam Constitutional: She appears well-developed and well-nourished. No distress.  HENT:  Head: Normocephalic and atraumatic.  Right Ear: External ear normal. Normal ear canal and TM Left Ear: External ear normal.  Normal ear canal and TM Mouth/Throat: Oropharynx is clear and moist.  Eyes: Conjunctivae and EOM are normal.  Neck: Neck supple. No tracheal deviation present. No thyromegaly present.  No carotid bruit   Cardiovascular: Normal rate, regular rhythm and normal heart sounds.   No murmur heard.  No edema. Pulmonary/Chest: Effort normal and breath sounds normal. No respiratory distress. She has no wheezes. She has no rales.  Breast: deferred   Abdominal: Soft. She exhibits no distension. There is no tenderness.  Lymphadenopathy: She has no cervical adenopathy.  Skin: Skin is warm and dry. She is not diaphoretic.  Psychiatric: She has a normal mood and affect. Her behavior is normal.     Lab Results  Component Value Date   WBC 8.7 02/27/2020   HGB 14.2 02/27/2020   HCT 45.7 02/27/2020   PLT 334 02/27/2020   GLUCOSE 90 06/21/2020   CHOL 159 06/21/2020   TRIG 107.0 06/21/2020   HDL 38.70 (L) 06/21/2020   LDLDIRECT 198.9 12/30/2012   LDLCALC 99 06/21/2020   ALT 13 06/21/2020   AST 20 06/21/2020   NA 142 06/21/2020   K 3.8 06/21/2020   CL 104 06/21/2020   CREATININE 0.92 06/21/2020   BUN 11 06/21/2020   CO2 31 06/21/2020   TSH 1.690 02/27/2020   HGBA1C 5.7 06/21/2020         Assessment & Plan:   Physical exam: Screening blood work  ordered Exercise  walking Weight  - working on weight loss Substance abuse  none   Reviewed recommended immunizations.  Flu immunization administered today.     Health Maintenance  Topic Date Due   Zoster Vaccines- Shingrix (1 of 2) Never done   COVID-19 Vaccine (2 - Booster for Janssen series) 08/08/2019   INFLUENZA VACCINE  10/11/2020   COLONOSCOPY (Pts 45-29yr Insurance coverage will need to be confirmed)  02/12/2021   TETANUS/TDAP  10/24/2021   DEXA SCAN  12/29/2022  Hepatitis C Screening  Completed   HPV VACCINES  Aged Out          See Problem List for Assessment and Plan of chronic medical problems.

## 2020-12-19 NOTE — Patient Instructions (Addendum)
Flu immunization administered today.     Blood work was ordered.     Medications changes include :  none    A referral was ordered for sports medicine.        Someone from their office will call you to schedule an appointment.   An ultrasound of your carotid arteries was ordered.    Please followup in 6 months    Health Maintenance, Female Adopting a healthy lifestyle and getting preventive care are important in promoting health and wellness. Ask your health care provider about: The right schedule for you to have regular tests and exams. Things you can do on your own to prevent diseases and keep yourself healthy. What should I know about diet, weight, and exercise? Eat a healthy diet  Eat a diet that includes plenty of vegetables, fruits, low-fat dairy products, and lean protein. Do not eat a lot of foods that are high in solid fats, added sugars, or sodium. Maintain a healthy weight Body mass index (BMI) is used to identify weight problems. It estimates body fat based on height and weight. Your health care provider can help determine your BMI and help you achieve or maintain a healthy weight. Get regular exercise Get regular exercise. This is one of the most important things you can do for your health. Most adults should: Exercise for at least 150 minutes each week. The exercise should increase your heart rate and make you sweat (moderate-intensity exercise). Do strengthening exercises at least twice a week. This is in addition to the moderate-intensity exercise. Spend less time sitting. Even light physical activity can be beneficial. Watch cholesterol and blood lipids Have your blood tested for lipids and cholesterol at 75 years of age, then have this test every 5 years. Have your cholesterol levels checked more often if: Your lipid or cholesterol levels are high. You are older than 75 years of age. You are at high risk for heart disease. What should I know about cancer  screening? Depending on your health history and family history, you may need to have cancer screening at various ages. This may include screening for: Breast cancer. Cervical cancer. Colorectal cancer. Skin cancer. Lung cancer. What should I know about heart disease, diabetes, and high blood pressure? Blood pressure and heart disease High blood pressure causes heart disease and increases the risk of stroke. This is more likely to develop in people who have high blood pressure readings, are of African descent, or are overweight. Have your blood pressure checked: Every 3-5 years if you are 32-32 years of age. Every year if you are 55 years old or older. Diabetes Have regular diabetes screenings. This checks your fasting blood sugar level. Have the screening done: Once every three years after age 32 if you are at a normal weight and have a low risk for diabetes. More often and at a younger age if you are overweight or have a high risk for diabetes. What should I know about preventing infection? Hepatitis B If you have a higher risk for hepatitis B, you should be screened for this virus. Talk with your health care provider to find out if you are at risk for hepatitis B infection. Hepatitis C Testing is recommended for: Everyone born from [birth year] through 1965. Anyone with known risk factors for hepatitis C. Sexually transmitted infections (STIs) Get screened for STIs, including gonorrhea and chlamydia, if: You are sexually active and are younger than 75 years of age. You are older than 75 years of  age and your health care provider tells you that you are at risk for this type of infection. Your sexual activity has changed since you were last screened, and you are at increased risk for chlamydia or gonorrhea. Ask your health care provider if you are at risk. Ask your health care provider about whether you are at high risk for HIV. Your health care provider may recommend a prescription medicine to  help prevent HIV infection. If you choose to take medicine to prevent HIV, you should first get tested for HIV. You should then be tested every 3 months for as long as you are taking the medicine. Pregnancy If you are about to stop having your period (premenopausal) and you may become pregnant, seek counseling before you get pregnant. Take 400 to 800 micrograms (mcg) of folic acid every day if you become pregnant. Ask for birth control (contraception) if you want to prevent pregnancy. Osteoporosis and menopause Osteoporosis is a disease in which the bones lose minerals and strength with aging. This can result in bone fractures. If you are 77 years old or older, or if you are at risk for osteoporosis and fractures, ask your health care provider if you should: Be screened for bone loss. Take a calcium or vitamin D supplement to lower your risk of fractures. Be given hormone replacement therapy (HRT) to treat symptoms of menopause. Follow these instructions at home: Lifestyle Do not use any products that contain nicotine or tobacco, such as cigarettes, e-cigarettes, and chewing tobacco. If you need help quitting, ask your health care provider. Do not use street drugs. Do not share needles. Ask your health care provider for help if you need support or information about quitting drugs. Alcohol use Do not drink alcohol if: Your health care provider tells you not to drink. You are pregnant, may be pregnant, or are planning to become pregnant. If you drink alcohol: Limit how much you use to 0-1 drink a day. Limit intake if you are breastfeeding. Be aware of how much alcohol is in your drink. In the [place] one drink equals one 12 oz bottle of beer (355 mL), one 5 oz glass of wine (148 mL), or one 1 oz glass of hard liquor (44 mL). General instructions Schedule regular health, dental, and eye exams. Stay current with your vaccines. Tell your health care provider if: You often feel depressed. You  have ever been abused or do not feel safe at home. Summary Adopting a healthy lifestyle and getting preventive care are important in promoting health and wellness. Follow your health care provider's instructions about healthy diet, exercising, and getting tested or screened for diseases. Follow your health care provider's instructions on monitoring your cholesterol and blood pressure. This information is not intended to replace advice given to you by your health care provider. Make sure you discuss any questions you have with your health care provider. Document Revised: 05/07/2020 Document Reviewed: 02/20/2018 Elsevier Patient Education  2022 [employer].

## 2020-12-20 ENCOUNTER — Telehealth: Payer: Self-pay

## 2020-12-20 NOTE — Progress Notes (Signed)
    Chronic Care Management Pharmacy Assistant   Name: Shelley Hayes  MRN: [id number] DOB: [birth year]   Reason for Encounter: Disease State   Conditions to be addressed/monitored: General   Recent office visits:  12/22/20 Binnie Rail, MD-PCP (Annual Exam) Flu immunization administered today,  Blood work was ordered, no med changes    Recent consult visits:  None ID  Hospital visits:  None in previous 6 months  Medications: Outpatient Encounter Medications as of 12/20/2020  Medication Sig   albuterol (VENTOLIN HFA) 108 (90 Base) MCG/ACT inhaler Inhale 2 puffs into the lungs every 6 (six) hours as needed for wheezing or shortness of breath.   Alcohol Swabs (B-D SINGLE USE SWABS REGULAR) PADS USE AS DIRECTED   ALPRAZolam (XANAX) 0.5 MG tablet Take 1 tablet (0.5 mg total) by mouth 2 (two) times daily as needed for anxiety or sleep.   amLODipine (NORVASC) 10 MG tablet TAKE 1 TABLET EVERY DAY   Ascorbic Acid (VITAMIN C) 1000 MG tablet Take 2,000 mg by mouth daily.   aspirin 81 MG tablet Take 81 mg by mouth daily.   atorvastatin (LIPITOR) 40 MG tablet TAKE 1 TABLET EVERY DAY   blood glucose meter kit and supplies KIT Dispense based on patient and insurance preference. Use up to four times daily as directed. R73.03.   Cholecalciferol (VITAMIN D3) 1000 UNITS CAPS Take 1 capsule (1,000 Units total) by mouth daily.   hydrochlorothiazide (MICROZIDE) 12.5 MG capsule TAKE 1 CAPSULE EVERY DAY   irbesartan-hydrochlorothiazide (AVALIDE) 150-12.5 MG tablet TAKE 1 TABLET EVERY DAY   latanoprost (XALATAN) 0.005 % ophthalmic solution PLACE 1 DROP INTO BOTH EYES AT BEDTIME.   Multiple Vitamin (MULTIVITAMIN) tablet Take 1 tablet by mouth daily.   potassium chloride SA (KLOR-CON) 20 MEQ tablet Take 1 tablet (20 mEq total) by mouth daily.   vitamin B-12 (CYANOCOBALAMIN) 1000 MCG tablet Take 1 tablet (1,000 mcg total) by mouth daily.   No facility-administered encounter medications on file as of  12/20/2020.   Reviewed chart for medication changes and drug therapy problems ahead of medication adherence call.  Attempted to contact patient x 3 for medication review and health check, unable to reach patient, left voicemails to return call.   Star Rating Drugs: Atorvastatin 40 mg 07/13/20 90 ds Irbesartan-hctz 150-12.5 mg 04/07/20 90 ds   Ethelene Hal Clinical Pharmacist Assistant [phone]

## 2020-12-22 ENCOUNTER — Encounter: Payer: Self-pay | Admitting: Internal Medicine

## 2020-12-22 ENCOUNTER — Other Ambulatory Visit: Payer: Self-pay

## 2020-12-22 ENCOUNTER — Ambulatory Visit (INDEPENDENT_AMBULATORY_CARE_PROVIDER_SITE_OTHER): Payer: Medicare PPO | Admitting: Internal Medicine

## 2020-12-22 VITALS — BP 134/78 | HR 74 | Temp 98.8°F | Ht <= 58 in | Wt 196.0 lb

## 2020-12-22 DIAGNOSIS — Z0000 Encounter for general adult medical examination without abnormal findings: Secondary | ICD-10-CM

## 2020-12-22 DIAGNOSIS — Z23 Encounter for immunization: Secondary | ICD-10-CM

## 2020-12-22 DIAGNOSIS — Z8673 Personal history of transient ischemic attack (TIA), and cerebral infarction without residual deficits: Secondary | ICD-10-CM | POA: Diagnosis not present

## 2020-12-22 DIAGNOSIS — M85861 Other specified disorders of bone density and structure, right lower leg: Secondary | ICD-10-CM | POA: Diagnosis not present

## 2020-12-22 DIAGNOSIS — R7303 Prediabetes: Secondary | ICD-10-CM

## 2020-12-22 DIAGNOSIS — F419 Anxiety disorder, unspecified: Secondary | ICD-10-CM

## 2020-12-22 DIAGNOSIS — I10 Essential (primary) hypertension: Secondary | ICD-10-CM | POA: Diagnosis not present

## 2020-12-22 DIAGNOSIS — E782 Mixed hyperlipidemia: Secondary | ICD-10-CM | POA: Diagnosis not present

## 2020-12-22 DIAGNOSIS — G8929 Other chronic pain: Secondary | ICD-10-CM

## 2020-12-22 DIAGNOSIS — M545 Low back pain, unspecified: Secondary | ICD-10-CM | POA: Diagnosis not present

## 2020-12-22 DIAGNOSIS — M85862 Other specified disorders of bone density and structure, left lower leg: Secondary | ICD-10-CM

## 2020-12-22 LAB — COMPREHENSIVE METABOLIC PANEL
ALT: 11 U/L (ref 0–35)
AST: 22 U/L (ref 0–37)
Albumin: 4.1 g/dL (ref 3.5–5.2)
Alkaline Phosphatase: 72 U/L (ref 39–117)
BUN: 10 mg/dL (ref 6–23)
CO2: 29 mEq/L (ref 19–32)
Calcium: 9.5 mg/dL (ref 8.4–10.5)
Chloride: 104 mEq/L (ref 96–112)
Creatinine, Ser: 0.91 mg/dL (ref 0.40–1.20)
GFR: 61.81 mL/min (ref >60.00)
Glucose, Bld: 96 mg/dL (ref 70–99)
Potassium: 4.4 mEq/L (ref 3.5–5.1)
Sodium: 142 mEq/L (ref 135–145)
Total Bilirubin: 0.7 mg/dL (ref 0.2–1.2)
Total Protein: 8 g/dL (ref 6.0–8.3)

## 2020-12-22 LAB — CBC WITH DIFFERENTIAL/PLATELET
Basophils Absolute: 0 10*3/uL (ref 0.0–0.1)
Basophils Relative: 0.7 % (ref 0.0–3.0)
Eosinophils Absolute: 0.1 10*3/uL (ref 0.0–0.7)
Eosinophils Relative: 1.5 % (ref 0.0–5.0)
HCT: 42.8 % (ref 36.0–46.0)
Hemoglobin: 13.7 g/dL (ref 12.0–15.0)
Lymphocytes Relative: 18.1 % (ref 12.0–46.0)
Lymphs Abs: 1.2 10*3/uL (ref 0.7–4.0)
MCHC: 32.1 g/dL (ref 30.0–36.0)
MCV: 86.2 fl (ref 78.0–100.0)
Monocytes Absolute: 0.5 10*3/uL (ref 0.1–1.0)
Monocytes Relative: 8.4 % (ref 3.0–12.0)
Neutro Abs: 4.5 10*3/uL (ref 1.4–7.7)
Neutrophils Relative %: 71.3 % (ref 43.0–77.0)
Platelets: 316 10*3/uL (ref 150.0–400.0)
RBC: 4.97 Mil/uL (ref 3.87–5.11)
RDW: 14.8 % (ref 11.5–15.5)
WBC: 6.4 10*3/uL (ref 4.0–10.5)

## 2020-12-22 LAB — LIPID PANEL
Cholesterol: 253 mg/dL — ABNORMAL HIGH (ref 0–200)
HDL: 44.5 mg/dL (ref >39.00)
LDL Cholesterol: 188 mg/dL — ABNORMAL HIGH (ref 0–99)
NonHDL: 208.93
Total CHOL/HDL Ratio: 6
Triglycerides: 106 mg/dL (ref 0.0–149.0)
VLDL: 21.2 mg/dL (ref 0.0–40.0)

## 2020-12-22 LAB — TSH: TSH: 0.76 u[IU]/mL (ref 0.35–5.50)

## 2020-12-22 LAB — HEMOGLOBIN A1C: Hgb A1c MFr Bld: 5.6 % (ref 4.6–6.5)

## 2020-12-22 MED ORDER — POTASSIUM CHLORIDE CRYS ER 20 MEQ PO TBCR: 20.0000 meq | EXTENDED_RELEASE_TABLET | Freq: Every day | ORAL | 2 refills | Status: DC

## 2020-12-22 MED ORDER — ATORVASTATIN CALCIUM 40 MG PO TABS: 40.0000 mg | ORAL_TABLET | ORAL | 2 refills | Status: DC

## 2020-12-22 NOTE — Assessment & Plan Note (Signed)
Chronic Check a1c Low sugar / carb diet Stressed regular exercise  

## 2020-12-22 NOTE — Assessment & Plan Note (Signed)
Chronic Regular exercise and healthy diet encouraged Check lipid panel  Continue atorvastatin-currently taking 40 mg every other day-she decreased the dose because she was having muscle cramping-Will adjust if needed based on blood work

## 2020-12-22 NOTE — Assessment & Plan Note (Addendum)
Chronic No residual deficit-she was unaware that she had a stroke Continue atorvastatin-recently decreased the dose she was taking to 40 mg every other day because of muscle cramping Continue aspirin 81 mg daily Blood pressure is well controlled Continue regular walking, healthy diet and weight loss efforts Will check carotid ultrasound

## 2020-12-22 NOTE — Assessment & Plan Note (Signed)
Chronic Intermittent-rare-she has anxiety only on a rare occasion is such as a funeral Can continue alprazolam 0.5 mg twice daily as needed, which she has not taken in a while

## 2020-12-22 NOTE — Assessment & Plan Note (Signed)
Chronic DEXA up-to-date Continue regular walking Continue vitamin D, multivitamin

## 2020-12-22 NOTE — Assessment & Plan Note (Signed)
Chronic Blood pressure well controlled CMP Continue amlodipine 10 mg daily, hydrochlorothiazide 12.5 mg daily, irbesartan-hydrochlorothiazide 150-12.5 mg daily

## 2020-12-24 NOTE — Addendum Note (Signed)
Addended by: Marcina Millard on: 12/24/2020 01:28 PM   Modules accepted: Orders

## 2020-12-28 NOTE — Progress Notes (Signed)
Benito Mccreedy D.[facility] [place] Phone: [phone]   Assessment and Plan:     1. Acute bilateral low back pain with right-sided sciatica -Chronic with exacerbation, initial sports medicine visit - Likely right-sided sciatica with right-sided gluteal muscle strains - Start HEP for sciatica and piriformis syndrome. - Start meloxicam 7.5 mg daily x2 weeks then discontinue - X-rays obtained in clinic.  My interpretation: No acute fracture.  Increase spacing between L3-L4 in comparison to other vertebrae.  Mild anterior inferior deformity of L2.  Generalized DDD and lumbar spine.  No significant cortical irregularities of the femoral heads or acetabulum - DG Lumbar Spine 2-3 Views; Future - DG Pelvis 1-2 Views; Future    Pertinent previous records reviewed include PCP note   Follow Up: In 2 weeks for reevaluation.  Could consider ultrasound of gluten Med/min +/- CSI, maintenance with as needed Tylenol/NSAIDs if no improvement or worsening of symptoms   Subjective:   I, Shelley Hayes, am serving as a scribe for Dr. Glennon Mac  Chief Complaint: Low back pain   HPI:   12/29/20 Patient is a 75 year old female presenting with low back pain. Patient had a fall back in May or June going into PT and since then she has been leaning to the right when walking, the pain is still in he lower back on both sides and feels PT did not help much and she also feels off balanced. Patient was seen by PCP on 12/22/20 and was referred to our office for further evaluation and treatment.  Patient states that she does have sciatica and feels like it has been worse since the fall and mornings are the worst. Once patient gets up and walking around she is okay.   Relevant Historical Information: Hypertension, prediabetes  Additional pertinent review of systems negative.   Current Outpatient Medications:    albuterol (VENTOLIN HFA) 108  (90 Base) MCG/ACT inhaler, Inhale 2 puffs into the lungs every 6 (six) hours as needed for wheezing or shortness of breath., Disp: 18 g, Rfl: 5   Alcohol Swabs (B-D SINGLE USE SWABS REGULAR) PADS, USE AS DIRECTED, Disp: 100 each, Rfl: 1   ALPRAZolam (XANAX) 0.5 MG tablet, Take 1 tablet (0.5 mg total) by mouth 2 (two) times daily as needed for anxiety or sleep., Disp: 30 tablet, Rfl: 0   amLODipine (NORVASC) 10 MG tablet, TAKE 1 TABLET EVERY DAY, Disp: 90 tablet, Rfl: 1   Ascorbic Acid (VITAMIN C) 1000 MG tablet, Take 2,000 mg by mouth daily., Disp: , Rfl:    aspirin 81 MG tablet, Take 81 mg by mouth daily., Disp: , Rfl:    atorvastatin (LIPITOR) 40 MG tablet, Take 1 tablet (40 mg total) by mouth every other day., Disp: 45 tablet, Rfl: 2   blood glucose meter kit and supplies KIT, Dispense based on patient and insurance preference. Use up to four times daily as directed. R73.03., Disp: 1 each, Rfl: 0   Cholecalciferol (VITAMIN D3) 1000 UNITS CAPS, Take 1 capsule (1,000 Units total) by mouth daily., Disp: 30 capsule, Rfl:    hydrochlorothiazide (MICROZIDE) 12.5 MG capsule, TAKE 1 CAPSULE EVERY DAY, Disp: 90 capsule, Rfl: 1   irbesartan-hydrochlorothiazide (AVALIDE) 150-12.5 MG tablet, TAKE 1 TABLET EVERY DAY, Disp: 90 tablet, Rfl: 1   latanoprost (XALATAN) 0.005 % ophthalmic solution, PLACE 1 DROP INTO BOTH EYES AT BEDTIME., Disp: 2.5 mL, Rfl: 0   meloxicam (MOBIC) 7.5 MG tablet, Take  1 tablet (7.5 mg total) by mouth daily., Disp: 14 tablet, Rfl: 0   Multiple Vitamin (MULTIVITAMIN) tablet, Take 1 tablet by mouth daily., Disp: , Rfl:    potassium chloride SA (KLOR-CON) 20 MEQ tablet, Take 1 tablet (20 mEq total) by mouth daily., Disp: 90 tablet, Rfl: 2   vitamin B-12 (CYANOCOBALAMIN) 1000 MCG tablet, Take 1 tablet (1,000 mcg total) by mouth daily., Disp: , Rfl:    Objective:     Vitals:   12/29/20 0950  BP: (!) 142/90  Pulse: 80  SpO2: 97%  Weight: 194 lb (88 kg)  Height: 4' 9" (1.448 m)       Body mass index is 41.98 kg/m.    Physical Exam:    General: awake, alert, and oriented no acute distress, nontoxic Skin: no suspicious lesions or rashes Neuro:sensation intact distally with no dificits, normal muscle tone, no atrophy, strength 5/5 in all tested lower ext groups Psych: normal mood and affect, speech clear  Right hip: No deformity, swelling or wasting ROM Fexion 90, ext 30, IR 45, ER 45 TTP significantly over gluteal musculature, mild over right lumbar paraspinal, right SI, greater troch Negative log roll with FROM Negative FABER Negative FADIR Positive piriformis test Negative trendelenberg Gait normal    Electronically signed by:  Benito Mccreedy D.[facility] 10:24 AM 12/29/20

## 2020-12-29 ENCOUNTER — Ambulatory Visit (INDEPENDENT_AMBULATORY_CARE_PROVIDER_SITE_OTHER): Payer: Medicare PPO | Admitting: Sports Medicine

## 2020-12-29 ENCOUNTER — Ambulatory Visit (INDEPENDENT_AMBULATORY_CARE_PROVIDER_SITE_OTHER): Payer: Medicare PPO

## 2020-12-29 ENCOUNTER — Other Ambulatory Visit: Payer: Self-pay

## 2020-12-29 VITALS — BP 142/90 | HR 80 | Ht <= 58 in | Wt 194.0 lb

## 2020-12-29 DIAGNOSIS — M545 Low back pain, unspecified: Secondary | ICD-10-CM

## 2020-12-29 DIAGNOSIS — R102 Pelvic and perineal pain: Secondary | ICD-10-CM | POA: Diagnosis not present

## 2020-12-29 DIAGNOSIS — M5441 Lumbago with sciatica, right side: Secondary | ICD-10-CM | POA: Diagnosis not present

## 2020-12-29 MED ORDER — MELOXICAM 7.5 MG PO TABS: 7.5000 mg | ORAL_TABLET | Freq: Every day | ORAL | 0 refills | Status: DC

## 2020-12-29 NOTE — Patient Instructions (Signed)
Good to see you  Meloxicam 7.5mg  daily for 2 weeks  Sciatica stretches given See me again in two weeks

## 2020-12-31 ENCOUNTER — Ambulatory Visit (HOSPITAL_COMMUNITY)
Admission: RE | Admit: 2020-12-31 | Discharge: 2020-12-31 | Disposition: A | Discharge status: Home / Self Care | Payer: Medicare PPO | Source: Ambulatory Visit | Attending: Cardiovascular Disease | Admitting: Cardiovascular Disease

## 2020-12-31 ENCOUNTER — Other Ambulatory Visit: Payer: Self-pay

## 2020-12-31 DIAGNOSIS — Z8673 Personal history of transient ischemic attack (TIA), and cerebral infarction without residual deficits: Secondary | ICD-10-CM

## 2021-01-05 ENCOUNTER — Telehealth: Payer: Self-pay

## 2021-01-05 NOTE — Progress Notes (Signed)
    Chronic Care Management Pharmacy Assistant   Name: PAMLA PANGLE  MRN: [id number] DOB: 1945-06-17  Reviewed chart for medication changes and adherence. Called patient to find out if she was still taking irbesartan-hctz 150 mg. Prescription was last filled on 11/23/20 prescribed by Dr. Quay Burow filled at [facility]. Left message to return call  No gaps in adherence identified.   [place] Pharmacist Assistant [phone]

## 2021-01-12 ENCOUNTER — Ambulatory Visit: Payer: Medicare PPO | Admitting: Sports Medicine

## 2021-01-18 NOTE — Progress Notes (Signed)
Shelley Hayes D.[facility] [place] Phone: [phone]   Assessment and Plan:     1. Acute bilateral low back pain with right-sided sciatica 2. Right hip pain -Chronic with exacerbation, subsequent visit - Complete relief of musculoskeletal complaints when taking meloxicam daily, however pain returned after completion of medication - Start physical therapy for low back and right-sided sciatica - Take Tylenol 500 mg 2 tablets twice a day for regular pain relief.  May use meloxicam as needed, 1-2 times a week for breakthrough pain - Ambulatory referral to Physical Therapy    Pertinent previous records reviewed include none   Follow Up: 4 weeks for reevaluation.  Could consider advanced imaging if no improvement or worsening of symptoms   Subjective:   I, Judy Pimple, am serving as a scribe for Dr. Glennon Mac  Chief Complaint: low back pain follow up   HPI:   12/29/20 Patient is a 75 year old female presenting with low back pain. Patient had a fall back in May or June going into PT and since then she has been leaning to the right when walking, the pain is still in he lower back on both sides and feels PT did not help much and she also feels off balanced. Patient was seen by PCP on 12/22/20 and was referred to our office for further evaluation and treatment.  Patient states that she does have sciatica and feels like it has been worse since the fall and mornings are the worst. Once patient gets up and walking around she is okay.   01/19/21 Patent states her back was doing wonderful while she was taking her medication but about a day after stopping the pain came back the exact same way that it was before taking the meloxicam.  Relevant Historical Information: Hypertension, prediabetes  Additional pertinent review of systems negative.   Current Outpatient Medications:    albuterol (VENTOLIN HFA) 108 (90 Base)  MCG/ACT inhaler, Inhale 2 puffs into the lungs every 6 (six) hours as needed for wheezing or shortness of breath., Disp: 18 g, Rfl: 5   Alcohol Swabs (B-D SINGLE USE SWABS REGULAR) PADS, USE AS DIRECTED, Disp: 100 each, Rfl: 1   ALPRAZolam (XANAX) 0.5 MG tablet, Take 1 tablet (0.5 mg total) by mouth 2 (two) times daily as needed for anxiety or sleep., Disp: 30 tablet, Rfl: 0   amLODipine (NORVASC) 10 MG tablet, TAKE 1 TABLET EVERY DAY, Disp: 90 tablet, Rfl: 1   Ascorbic Acid (VITAMIN C) 1000 MG tablet, Take 2,000 mg by mouth daily., Disp: , Rfl:    aspirin 81 MG tablet, Take 81 mg by mouth daily., Disp: , Rfl:    atorvastatin (LIPITOR) 40 MG tablet, Take 1 tablet (40 mg total) by mouth every other day., Disp: 45 tablet, Rfl: 2   blood glucose meter kit and supplies KIT, Dispense based on patient and insurance preference. Use up to four times daily as directed. R73.03., Disp: 1 each, Rfl: 0   Cholecalciferol (VITAMIN D3) 1000 UNITS CAPS, Take 1 capsule (1,000 Units total) by mouth daily., Disp: 30 capsule, Rfl:    hydrochlorothiazide (MICROZIDE) 12.5 MG capsule, TAKE 1 CAPSULE EVERY DAY, Disp: 90 capsule, Rfl: 1   irbesartan-hydrochlorothiazide (AVALIDE) 150-12.5 MG tablet, TAKE 1 TABLET EVERY DAY, Disp: 90 tablet, Rfl: 1   latanoprost (XALATAN) 0.005 % ophthalmic solution, PLACE 1 DROP INTO BOTH EYES AT BEDTIME., Disp: 2.5 mL, Rfl: 0   Multiple Vitamin (  MULTIVITAMIN) tablet, Take 1 tablet by mouth daily., Disp: , Rfl:    potassium chloride SA (KLOR-CON) 20 MEQ tablet, Take 1 tablet (20 mEq total) by mouth daily., Disp: 90 tablet, Rfl: 2   vitamin B-12 (CYANOCOBALAMIN) 1000 MCG tablet, Take 1 tablet (1,000 mcg total) by mouth daily., Disp: , Rfl:    meloxicam (MOBIC) 7.5 MG tablet, Take 1 tablet (7.5 mg total) by mouth daily., Disp: 30 tablet, Rfl: 0   Objective:     Vitals:   01/19/21 0951  BP: 128/88  Pulse: 80  SpO2: 99%  Weight: 195 lb (88.5 kg)  Height: $Remove'4\' 9"'[id number]$  (1.448 m)      Body mass  index is 42.2 kg/m.    Physical Exam:    General: awake, alert, and oriented no acute distress, nontoxic Skin: no suspicious lesions or rashes Neuro:sensation intact distally with no dificits, normal muscle tone, no atrophy, strength 5/5 in all tested lower ext groups Psych: normal mood and affect, speech clear   Right hip: No deformity, swelling or wasting ROM Fexion 90, ext 30, IR 45, ER 45 TTP moderately over gluteal musculature, mild over right lumbar paraspinal, right SI, greater troch Negative log roll with FROM Negative FABER Negative FADIR Positive piriformis test Negative trendelenberg Gait normal        Electronically signed by:  Shelley Hayes D.[facility] 12:19 PM 01/19/21

## 2021-01-19 ENCOUNTER — Other Ambulatory Visit: Payer: Self-pay

## 2021-01-19 ENCOUNTER — Ambulatory Visit (INDEPENDENT_AMBULATORY_CARE_PROVIDER_SITE_OTHER): Payer: Medicare PPO | Admitting: Sports Medicine

## 2021-01-19 VITALS — BP 128/88 | HR 80 | Ht <= 58 in | Wt 195.0 lb

## 2021-01-19 DIAGNOSIS — M25551 Pain in right hip: Secondary | ICD-10-CM

## 2021-01-19 DIAGNOSIS — M5441 Lumbago with sciatica, right side: Secondary | ICD-10-CM | POA: Diagnosis not present

## 2021-01-19 MED ORDER — MELOXICAM 7.5 MG PO TABS: 7.5000 mg | ORAL_TABLET | Freq: Every day | ORAL | 0 refills | Status: DC

## 2021-01-19 NOTE — Patient Instructions (Addendum)
Good to see you  Physical therapy referral placed  Meloxicam refilled use as needed  Start taking tylenol 500mg  2 tablets twice a day Follow up in 4 weeks

## 2021-02-18 ENCOUNTER — Ambulatory Visit: Payer: Medicare PPO | Admitting: Sports Medicine

## 2021-02-21 ENCOUNTER — Encounter: Payer: Self-pay | Admitting: Gastroenterology

## 2021-02-22 DIAGNOSIS — N952 Postmenopausal atrophic vaginitis: Secondary | ICD-10-CM | POA: Diagnosis not present

## 2021-02-22 DIAGNOSIS — Z6839 Body mass index (BMI) 39.0-39.9, adult: Secondary | ICD-10-CM | POA: Diagnosis not present

## 2021-02-22 DIAGNOSIS — Z01419 Encounter for gynecological examination (general) (routine) without abnormal findings: Secondary | ICD-10-CM | POA: Diagnosis not present

## 2021-02-22 DIAGNOSIS — Z1231 Encounter for screening mammogram for malignant neoplasm of breast: Secondary | ICD-10-CM | POA: Diagnosis not present

## 2021-02-23 ENCOUNTER — Other Ambulatory Visit: Payer: Self-pay | Admitting: Obstetrics and Gynecology

## 2021-02-23 DIAGNOSIS — R928 Other abnormal and inconclusive findings on diagnostic imaging of breast: Secondary | ICD-10-CM

## 2021-03-08 ENCOUNTER — Other Ambulatory Visit: Payer: Self-pay | Admitting: Internal Medicine

## 2021-04-01 ENCOUNTER — Ambulatory Visit (INDEPENDENT_AMBULATORY_CARE_PROVIDER_SITE_OTHER): Payer: Medicare PPO

## 2021-04-01 ENCOUNTER — Other Ambulatory Visit: Payer: Self-pay

## 2021-04-01 VITALS — BP 150/72 | HR 52 | Ht <= 58 in | Wt 196.0 lb

## 2021-04-01 DIAGNOSIS — Z0000 Encounter for general adult medical examination without abnormal findings: Secondary | ICD-10-CM

## 2021-04-01 NOTE — Progress Notes (Signed)
I connected with Shelley Hayes today by telephone and verified that I am speaking with the correct person using two identifiers. Location patient: home Location provider: work Persons participating in the virtual visit: patient, provider.   I discussed the limitations, risks, security and privacy concerns of performing an evaluation and management service by telephone and the availability of in person appointments. I also discussed with the patient that there may be a patient responsible charge related to this service. The patient expressed understanding and verbally consented to this telephonic visit.    Interactive audio and video telecommunications were attempted between this provider and patient, however failed, due to patient having technical difficulties OR patient did not have access to video capability.  We continued and completed visit with audio only.  Some vital signs may be absent or patient reported.   Time Spent with patient on telephone encounter: 40 minutes  Subjective:   Shelley Hayes is a 75 y.o. female who presents for Medicare Annual (Subsequent) preventive examination.  Review of Systems     Cardiac Risk Factors include: advanced age (>38men, >51 women);dyslipidemia;obesity (BMI >30kg/m2);family history of premature cardiovascular disease;hypertension     Objective:    Today's Vitals   04/01/21 0858  BP: (!) 150/72  Pulse: (!) 52  Weight: 196 lb (88.9 kg)  Height: $Remove'4\' 9"'KKdokiA$  (1.448 m)  PainSc: 0-No pain   Body mass index is 42.41 kg/m.  Advanced Directives 04/01/2021 03/15/2020 02/27/2020 07/25/2017 02/29/2016  Does Patient Have a Medical Advance Directive? Yes Yes Yes Yes Yes  Type of Advance Directive Living will;Healthcare Power of [place];Living will [place];Living will [place];Living will Living will;Healthcare Power of Attorney  Does patient want to make changes to medical advance directive?  No - Patient declined No - Patient declined - - No - Patient declined  Copy of [place] in Chart? No - copy requested No - copy requested - No - copy requested No - copy requested  Would patient like information on creating a medical advance directive? - - No - Patient declined - -    Current Medications (verified) Outpatient Encounter Medications as of 04/01/2021  Medication Sig   albuterol (VENTOLIN HFA) 108 (90 Base) MCG/ACT inhaler Inhale 2 puffs into the lungs every 6 (six) hours as needed for wheezing or shortness of breath.   Alcohol Swabs (B-D SINGLE USE SWABS REGULAR) PADS USE AS DIRECTED   ALPRAZolam (XANAX) 0.5 MG tablet Take 1 tablet (0.5 mg total) by mouth 2 (two) times daily as needed for anxiety or sleep.   amLODipine (NORVASC) 10 MG tablet TAKE 1 TABLET EVERY DAY   Ascorbic Acid (VITAMIN C) 1000 MG tablet Take 2,000 mg by mouth daily.   aspirin 81 MG tablet Take 81 mg by mouth daily.   atorvastatin (LIPITOR) 40 MG tablet TAKE 1 TABLET EVERY DAY   blood glucose meter kit and supplies KIT Dispense based on patient and insurance preference. Use up to four times daily as directed. R73.03.   Cholecalciferol (VITAMIN D3) 1000 UNITS CAPS Take 1 capsule (1,000 Units total) by mouth daily.   hydrochlorothiazide (MICROZIDE) 12.5 MG capsule TAKE 1 CAPSULE EVERY DAY   irbesartan-hydrochlorothiazide (AVALIDE) 150-12.5 MG tablet TAKE 1 TABLET EVERY DAY   latanoprost (XALATAN) 0.005 % ophthalmic solution PLACE 1 DROP INTO BOTH EYES AT BEDTIME.   meloxicam (MOBIC) 7.5 MG tablet Take 1 tablet (7.5 mg total) by mouth daily.   Multiple Vitamin (MULTIVITAMIN)  tablet Take 1 tablet by mouth daily.   potassium chloride SA (KLOR-CON) 20 MEQ tablet Take 1 tablet (20 mEq total) by mouth daily.   vitamin B-12 (CYANOCOBALAMIN) 1000 MCG tablet Take 1 tablet (1,000 mcg total) by mouth daily.   No facility-administered encounter medications on file as of 04/01/2021.    Allergies  (verified) Hydrocodone bit-homatrop mbr, Losartan, and Spironolactone   History: Past Medical History:  Diagnosis Date   ALLERGIC RHINITIS    Allergic rhinitis 08/23/2009   Allergy    Anxiety    ARTHRITIS    Arthritis    Blood in stool 04/24/2017   Cataract    bil cataracts removes   Cervical paraspinal muscle spasm 12/23/2015   Chest pain 04/24/2017   CHEST PAIN 12/19/2009   Qualifier: Diagnosis of  By: Marca Ancona RMA, Lucy     COLONIC POLYPS, HX OF 08/23/2009   Qualifier: Diagnosis of  By: Marca Ancona RMA, Lucy     Cough 06/19/2016   Fluttering heart 12/23/2015   GERD (gastroesophageal reflux disease)    Glaucoma    GLUCOMA    Heart murmur    Herpes zoster without complication 09/18/6281   Hyperglycemia 06/19/2016   HYPERLIPIDEMIA    HYPERTENSION    KNEE PAIN 08/23/2009   Qualifier: Diagnosis of  By: Asa Lente MD, Valerie A    Muscle cramps 06/19/2016   Osteopenia 04/26/2016   2/18 - dexa - RFN and LFN  -1.2   OTITIS EXTERNA, RIGHT 01/20/2010   Qualifier: Diagnosis of  By: Asa Lente MD, Jannifer Rodney    Prediabetes 04/24/2017   SCIATICA, LEFT 11/2010   Past Surgical History:  Procedure Laterality Date   BREAST BIOPSY Right    benign   COLONOSCOPY     ESOPHAGOGASTRODUODENOSCOPY     POLYPECTOMY     TUBAL LIGATION     UPPER GASTROINTESTINAL ENDOSCOPY     Family History  Problem Relation Age of Onset   Arthritis Mother    Diabetes Mother    Colon cancer Mother 58   Colon polyps Mother    Arthritis Father    Alcohol abuse Brother    Lung cancer Brother    Prostate cancer Brother    Diabetes Brother    Hypertension Brother    Lung cancer Other        Neice   Esophageal cancer Neg Hx    Liver cancer Neg Hx    Pancreatic cancer Neg Hx    Rectal cancer Neg Hx    Stomach cancer Neg Hx    Social History   Socioeconomic History   Marital status: Divorced    Spouse name: Not on file   Number of children: 2   Years of education: Not on file   Highest education level: Not on file   Occupational History   Occupation: Retired  Tobacco Use   Smoking status: Never   Smokeless tobacco: Never  Vaping Use   Vaping Use: Never used  Substance and Sexual Activity   Alcohol use: No    Alcohol/week: 0.0 standard drinks   Drug use: No   Sexual activity: Not Currently  Other Topics Concern   Not on file  Social History Narrative   Divorced, lives alone- in [place] for [place]yrs but moved "home" to [place] in 2008      Walking regularly, every morning   Social Determinants of Health   Financial Resource Strain: Low Risk    Difficulty of Paying Living Expenses: Not hard at all  Food  Insecurity: No Food Insecurity   Worried About Charity fundraiser in the Last Year: Never true   Ran Out of Food in the Last Year: Never true  Transportation Needs: No Transportation Needs   Lack of Transportation (Medical): No   Lack of Transportation (Non-Medical): No  Physical Activity: Sufficiently Active   Days of Exercise per Week: 5 days   Minutes of Exercise per Session: 30 min  Stress: No Stress Concern Present   Feeling of Stress : Not at all  Social Connections: Moderately Integrated   Frequency of Communication with Friends and Family: More than three times a week   Frequency of Social Gatherings with Friends and Family: More than three times a week   Attends Religious Services: More than 4 times per year   Active Member of [employer] or Organizations: Yes   Attends Music therapist: More than 4 times per year   Marital Status: Divorced    Tobacco Counseling Counseling given: Not Answered   Clinical Intake:  Pre-visit preparation completed: Yes  Pain : No/denies pain Pain Score: 0-No pain     Nutritional Risks: None Diabetes: No  How often do you need to have someone help you when you read instructions, pamphlets, or other written materials from your doctor or pharmacy?: 1 - Never What is the last grade level you completed in school?: HSG  Diabetic?  no  Interpreter Needed?: No  Information entered by :: Lisette Abu, LPN   Activities of Daily Living In your present state of health, do you have any difficulty performing the following activities: 04/01/2021  Hearing? N  Vision? N  Difficulty concentrating or making decisions? N  Walking or climbing stairs? N  Dressing or bathing? N  Doing errands, shopping? N  Preparing Food and eating ? N  Using the Toilet? N  In the past six months, have you accidently leaked urine? N  Do you have problems with loss of bowel control? N  Managing your Medications? N  Managing your Finances? N  Housekeeping or managing your Housekeeping? N  Some recent data might be hidden    Patient Care Team: Binnie Rail, MD as PCP - General (Internal Medicine) Josue Hector, MD as PCP - Cardiology (Cardiology) Everlene Farrier, MD (Obstetrics and Gynecology) Victory Dakin, MD as Referring Physician (Optometry) Charlton Haws, [facility] as Pharmacist (Pharmacist)  Indicate any recent Medical Services you may have received from other than [facility] providers in the past year (date may be approximate).     Assessment:   This is a routine wellness examination for [place].  Hearing/Vision screen Hearing Screening - Comments:: Patient denied any hearing difficulty.   No hearing aids.  Vision Screening - Comments:: Patient wears corrective glasses/contacts.  Eye exam done annually by: Myra Rude, MD.  Dietary issues and exercise activities discussed: Current Exercise Habits: Home exercise routine, Type of exercise: walking, Time (Minutes): 30, Frequency (Times/Week): 5, Weekly Exercise (Minutes/Week): 150, Intensity: Moderate, Exercise limited by: None identified   Goals Addressed               This Visit's Progress     Patient Stated (pt-stated)        My goal is to continue to lose weight.  I would like to be 140 pounds.      Depression Screen PHQ 2/9 Scores 04/01/2021 03/15/2020 12/22/2019  12/22/2019 12/16/2018 07/25/2017 04/24/2017  PHQ - 2 Score 0 0 0 0 0 1 0  PHQ- 9  Score - - - - - 3 -    Fall Risk Fall Risk  04/01/2021 03/15/2020 12/22/2019 12/16/2018 07/25/2017  Falls in the past year? 1 0 0 0 No  Number falls in past yr: 1 0 0 0 -  Injury with Fall? 0 0 0 - -  Risk for fall due to : Orthopedic patient No Fall Risks No Fall Risks - -  Follow up Falls evaluation completed Falls evaluation completed Falls evaluation completed - -    FALL RISK PREVENTION PERTAINING TO THE HOME:  Any stairs in or around the home? No  If so, are there any without handrails? No  Home free of loose throw rugs in walkways, pet beds, electrical cords, etc? Yes  Adequate lighting in your home to reduce risk of falls? Yes   ASSISTIVE DEVICES UTILIZED TO PREVENT FALLS:  Life alert? No  Use of a cane, walker or w/c? No  Grab bars in the bathroom? No  Shower chair or bench in shower? Yes  Elevated toilet seat or a handicapped toilet? Yes   TIMED UP AND GO:  Was the test performed? No .  Length of time to ambulate 10 feet: n/a sec.   Gait steady and fast without use of assistive device  Cognitive Function:        Immunizations Immunization History  Administered Date(s) Administered   Fluad Quad(high Dose 65+) 12/16/2018, 12/23/2019, 12/22/2020   Influenza Whole 01/20/2010   Influenza, High Dose Seasonal PF 12/30/2012, 02/26/2015, 02/29/2016, 02/06/2017, 03/27/2018   Influenza,inj,Quad PF,6+ Mos 02/23/2014   Janssen (J&J) SARS-COV-2 Vaccination 06/13/2019   Pneumococcal Conjugate-13 02/29/2016   Pneumococcal Polysaccharide-23 02/23/2014   Tetanus 10/25/2011    TDAP status: Up to date  Flu Vaccine status: Up to date  Pneumococcal vaccine status: Up to date  Covid-19 vaccine status: Completed vaccines  Qualifies for Shingles Vaccine? Yes   Zostavax completed No   Shingrix Completed?: No.    Education has been provided regarding the importance of this vaccine. Patient has been  advised to call insurance company to determine out of pocket expense if they have not yet received this vaccine. Advised may also receive vaccine at local pharmacy or Health Dept. Verbalized acceptance and understanding.  Screening Tests Health Maintenance  Topic Date Due   Zoster Vaccines- Shingrix (1 of 2) Never done   COVID-19 Vaccine (2 - Booster for Janssen series) 08/08/2019   COLONOSCOPY (Pts 45-32yrs Insurance coverage will need to be confirmed)  02/12/2021   TETANUS/TDAP  10/24/2021   DEXA SCAN  12/29/2022   Pneumonia Vaccine 50+ Years old  Completed   INFLUENZA VACCINE  Completed   Hepatitis C Screening  Completed   HPV VACCINES  Aged Out    Health Maintenance  Health Maintenance Due  Topic Date Due   Zoster Vaccines- Shingrix (1 of 2) Never done   COVID-19 Vaccine (2 - Booster for [employer] series) 08/08/2019   COLONOSCOPY (Pts 45-33yrs Insurance coverage will need to be confirmed)  02/12/2021    Colorectal cancer screening: Type of screening: Colonoscopy. Completed 02/13/2020. Repeat every 1 years (scheduled for 04/06/2021)  Mammogram status: Completed 02/19/2020. Repeat every year  Bone Density status: Completed 12/29/2019. Results reflect: Bone density results: OSTEOPENIA. Repeat every 2-3 years.  Lung Cancer Screening: (Low Dose CT Chest recommended if Age 27-80 years, 30 pack-year currently smoking OR have quit w/in 15years.) does not qualify.   Lung Cancer Screening Referral: no  Additional Screening:  Hepatitis C Screening: does qualify; Completed yes  Vision Screening: Recommended annual ophthalmology exams for early detection of glaucoma and other disorders of the eye. Is the patient up to date with their annual eye exam?  Yes  Who is the provider or what is the name of the office in which the patient attends annual eye exams? Myra Rude, [place]. If pt is not established with a provider, would they like to be referred to a provider to establish care? No .    Dental Screening: Recommended annual dental exams for proper oral hygiene  Community Resource Referral / Chronic Care Management: CRR required this visit?  No   CCM required this visit?  No      Plan:     I have personally reviewed and noted the following in the patients chart:   Medical and social history Use of alcohol, tobacco or illicit drugs  Current medications and supplements including opioid prescriptions.  Functional ability and status Nutritional status Physical activity Advanced directives List of other physicians Hospitalizations, surgeries, and ER visits in previous 12 months Vitals Screenings to include cognitive, depression, and falls Referrals and appointments  In addition, I have reviewed and discussed with patient certain preventive protocols, quality metrics, and best practice recommendations. A written personalized care plan for preventive services as well as general preventive health recommendations were provided to patient.     Sheral Flow, LPN   7/98/1025   Nurse Notes:  Patient is cogitatively intact. Patient provided her vitals for this visit. Hearing Screening - Comments:: Patient denied any hearing difficulty.   No hearing aids.  Vision Screening - Comments:: Patient wears corrective glasses/contacts.  Eye exam done annually by: Myra Rude, MD.

## 2021-04-06 ENCOUNTER — Ambulatory Visit
Admission: RE | Admit: 2021-04-06 | Discharge: 2021-04-06 | Disposition: A | Discharge status: Home / Self Care | Payer: Medicare PPO | Source: Ambulatory Visit | Attending: Obstetrics and Gynecology | Admitting: Obstetrics and Gynecology

## 2021-04-06 ENCOUNTER — Other Ambulatory Visit: Payer: Self-pay | Admitting: Obstetrics and Gynecology

## 2021-04-06 DIAGNOSIS — R921 Mammographic calcification found on diagnostic imaging of breast: Secondary | ICD-10-CM

## 2021-04-06 DIAGNOSIS — R928 Other abnormal and inconclusive findings on diagnostic imaging of breast: Secondary | ICD-10-CM

## 2021-04-06 DIAGNOSIS — R922 Inconclusive mammogram: Secondary | ICD-10-CM | POA: Diagnosis not present

## 2021-04-18 ENCOUNTER — Ambulatory Visit
Admission: RE | Admit: 2021-04-18 | Discharge: 2021-04-18 | Disposition: A | Discharge status: Home / Self Care | Payer: Medicare PPO | Source: Ambulatory Visit | Attending: Obstetrics and Gynecology | Admitting: Obstetrics and Gynecology

## 2021-04-18 ENCOUNTER — Other Ambulatory Visit: Payer: Self-pay | Admitting: Obstetrics and Gynecology

## 2021-04-18 DIAGNOSIS — R921 Mammographic calcification found on diagnostic imaging of breast: Secondary | ICD-10-CM | POA: Diagnosis not present

## 2021-04-18 DIAGNOSIS — D0511 Intraductal carcinoma in situ of right breast: Secondary | ICD-10-CM | POA: Diagnosis not present

## 2021-04-20 ENCOUNTER — Other Ambulatory Visit: Payer: Self-pay | Admitting: Obstetrics and Gynecology

## 2021-04-20 DIAGNOSIS — C50919 Malignant neoplasm of unspecified site of unspecified female breast: Secondary | ICD-10-CM | POA: Insufficient documentation

## 2021-04-20 DIAGNOSIS — R921 Mammographic calcification found on diagnostic imaging of breast: Secondary | ICD-10-CM

## 2021-04-27 ENCOUNTER — Ambulatory Visit
Admission: RE | Admit: 2021-04-27 | Discharge: 2021-04-27 | Disposition: A | Discharge status: Home / Self Care | Payer: Medicare PPO | Source: Ambulatory Visit | Attending: Obstetrics and Gynecology | Admitting: Obstetrics and Gynecology

## 2021-04-27 DIAGNOSIS — R921 Mammographic calcification found on diagnostic imaging of breast: Secondary | ICD-10-CM

## 2021-04-27 DIAGNOSIS — N6011 Diffuse cystic mastopathy of right breast: Secondary | ICD-10-CM | POA: Diagnosis not present

## 2021-04-29 DIAGNOSIS — D0511 Intraductal carcinoma in situ of right breast: Secondary | ICD-10-CM | POA: Diagnosis not present

## 2021-05-11 DIAGNOSIS — C801 Malignant (primary) neoplasm, unspecified: Secondary | ICD-10-CM

## 2021-05-11 HISTORY — DX: Malignant (primary) neoplasm, unspecified: C80.1

## 2021-05-19 DIAGNOSIS — D0511 Intraductal carcinoma in situ of right breast: Secondary | ICD-10-CM | POA: Insufficient documentation

## 2021-05-19 DIAGNOSIS — Z86000 Personal history of in-situ neoplasm of breast: Secondary | ICD-10-CM | POA: Insufficient documentation

## 2021-05-20 DIAGNOSIS — H409 Unspecified glaucoma: Secondary | ICD-10-CM | POA: Diagnosis not present

## 2021-05-20 DIAGNOSIS — M549 Dorsalgia, unspecified: Secondary | ICD-10-CM | POA: Diagnosis not present

## 2021-05-20 DIAGNOSIS — I10 Essential (primary) hypertension: Secondary | ICD-10-CM | POA: Diagnosis not present

## 2021-05-20 DIAGNOSIS — Z7982 Long term (current) use of aspirin: Secondary | ICD-10-CM | POA: Diagnosis not present

## 2021-05-20 DIAGNOSIS — Z79899 Other long term (current) drug therapy: Secondary | ICD-10-CM | POA: Diagnosis not present

## 2021-05-20 DIAGNOSIS — M542 Cervicalgia: Secondary | ICD-10-CM | POA: Diagnosis not present

## 2021-05-20 DIAGNOSIS — N62 Hypertrophy of breast: Secondary | ICD-10-CM | POA: Diagnosis not present

## 2021-05-20 DIAGNOSIS — G8929 Other chronic pain: Secondary | ICD-10-CM | POA: Diagnosis not present

## 2021-05-20 DIAGNOSIS — Z170 Estrogen receptor positive status [ER+]: Secondary | ICD-10-CM | POA: Diagnosis not present

## 2021-05-20 DIAGNOSIS — M1990 Unspecified osteoarthritis, unspecified site: Secondary | ICD-10-CM | POA: Diagnosis not present

## 2021-05-20 DIAGNOSIS — D0511 Intraductal carcinoma in situ of right breast: Secondary | ICD-10-CM | POA: Diagnosis not present

## 2021-05-21 ENCOUNTER — Other Ambulatory Visit: Payer: Self-pay | Admitting: Internal Medicine

## 2021-05-24 ENCOUNTER — Ambulatory Visit: Payer: Self-pay | Admitting: Surgery

## 2021-05-24 ENCOUNTER — Other Ambulatory Visit: Payer: Self-pay | Admitting: Surgery

## 2021-05-24 DIAGNOSIS — D0511 Intraductal carcinoma in situ of right breast: Secondary | ICD-10-CM

## 2021-06-16 ENCOUNTER — Encounter (HOSPITAL_BASED_OUTPATIENT_CLINIC_OR_DEPARTMENT_OTHER): Payer: Self-pay | Admitting: Surgery

## 2021-06-16 ENCOUNTER — Other Ambulatory Visit: Payer: Self-pay

## 2021-06-21 ENCOUNTER — Encounter: Payer: Self-pay | Admitting: Internal Medicine

## 2021-06-21 NOTE — Patient Instructions (Addendum)
? ? ? ?  Blood work was ordered.   ? ? ?Medications changes include : None ? ? ?Your prescription(s) have been sent to your pharmacy.  ? ? ? ?Return in about 6 months (around 12/22/2021) for CPE. ? ?

## 2021-06-21 NOTE — Progress Notes (Signed)
? ? ? ? ?Subjective:  ? ? Patient ID: Shelley Hayes, female    DOB: Jul 28, 1945, 76 y.o.   MRN: [id number] ? ?This visit occurred during the SARS-CoV-2 public health emergency.  Safety protocols were in place, including screening questions prior to the visit, additional usage of staff PPE, and extensive cleaning of exam room while observing appropriate contact time as indicated for disinfecting solutions.   ? ? ?HPI ?Shelley Hayes is here for follow up of her chronic medical problems, including htn, hld, prediabetes, h/o cva on imaging, morbid obesity, anxiety ? ?Breast biopsy 2/203 - DCIS - for lumpectomy with radioactive seed implantation on 4/18.  She is also scheduled for breast reduction surgery. ? ?She is walking.  She has changed her diet and has lost weight.  She has been eating more sugars recently and is concerned about what her sugar will be. ? ?Medications and allergies reviewed with patient and updated if appropriate. ? ?Current Outpatient Medications on File Prior to Visit  ?Medication Sig Dispense Refill  ? albuterol (VENTOLIN HFA) 108 (90 Base) MCG/ACT inhaler Inhale 2 puffs into the lungs every 6 (six) hours as needed for wheezing or shortness of breath. 18 g 5  ? Alcohol Swabs (B-D SINGLE USE SWABS REGULAR) PADS USE AS DIRECTED 100 each 1  ? ALPRAZolam (XANAX) 0.5 MG tablet Take 1 tablet (0.5 mg total) by mouth 2 (two) times daily as needed for anxiety or sleep. 30 tablet 0  ? amLODipine (NORVASC) 10 MG tablet TAKE 1 TABLET EVERY DAY 90 tablet 1  ? Ascorbic Acid (VITAMIN C) 1000 MG tablet Take 2,000 mg by mouth daily.    ? aspirin 81 MG tablet Take 81 mg by mouth daily.    ? atorvastatin (LIPITOR) 40 MG tablet TAKE 1 TABLET EVERY DAY 90 tablet 1  ? blood glucose meter kit and supplies KIT Dispense based on patient and insurance preference. Use up to four times daily as directed. R73.03. 1 each 0  ? Cholecalciferol (VITAMIN D3) 1000 UNITS CAPS Take 1 capsule (1,000 Units total) by mouth daily. 30 capsule   ?  hydrochlorothiazide (MICROZIDE) 12.5 MG capsule TAKE 1 CAPSULE EVERY DAY 90 capsule 1  ? irbesartan-hydrochlorothiazide (AVALIDE) 150-12.5 MG tablet TAKE 1 TABLET EVERY DAY 90 tablet 1  ? meloxicam (MOBIC) 7.5 MG tablet Take 1 tablet (7.5 mg total) by mouth daily. 30 tablet 0  ? Multiple Vitamin (MULTIVITAMIN) tablet Take 1 tablet by mouth daily.    ? potassium chloride SA (KLOR-CON) 20 MEQ tablet Take 1 tablet (20 mEq total) by mouth daily. 90 tablet 2  ? vitamin B-12 (CYANOCOBALAMIN) 1000 MCG tablet Take 1 tablet (1,000 mcg total) by mouth daily.    ? ?No current facility-administered medications on file prior to visit.  ? ? ? ?Review of Systems  ?Constitutional:  Negative for chills and fever.  ?Respiratory:  Negative for cough, shortness of breath and wheezing.   ?Cardiovascular:  Negative for chest pain, palpitations and leg swelling.  ?Neurological:  Negative for light-headedness and headaches.  ? ?   ?Objective:  ? ?Vitals:  ? 06/22/21 0847  ?BP: 136/78  ?Pulse: 71  ?Temp: 98.1 ?F (36.7 ?C)  ?SpO2: 99%  ? ?BP Readings from Last 3 Encounters:  ?06/22/21 136/78  ?04/01/21 (!) 150/72  ?01/19/21 128/88  ? ?Wt Readings from Last 3 Encounters:  ?06/22/21 192 lb 12.8 oz (87.5 kg)  ?04/01/21 196 lb (88.9 kg)  ?01/19/21 195 lb (88.5 kg)  ? ?Body mass index  is 41.72 kg/m?. ? ?  ?Physical Exam ?Constitutional:   ?   General: She is not in acute distress. ?   Appearance: Normal appearance.  ?HENT:  ?   Head: Normocephalic and atraumatic.  ?Eyes:  ?   Conjunctiva/sclera: Conjunctivae normal.  ?Cardiovascular:  ?   Rate and Rhythm: Normal rate and regular rhythm.  ?   Heart sounds: Normal heart sounds. No murmur heard. ?Pulmonary:  ?   Effort: Pulmonary effort is normal. No respiratory distress.  ?   Breath sounds: Normal breath sounds. No wheezing.  ?Musculoskeletal:  ?   Cervical back: Neck supple.  ?   Right lower leg: No edema.  ?   Left lower leg: No edema.  ?Lymphadenopathy:  ?   Cervical: No cervical adenopathy.   ?Skin: ?   Findings: No rash.  ?Neurological:  ?   Mental Status: She is alert. Mental status is at baseline.  ?Psychiatric:     ?   Mood and Affect: Mood normal.     ?   Behavior: Behavior normal.  ? ?   ? ?Lab Results  ?Component Value Date  ? WBC 6.4 12/22/2020  ? HGB 13.7 12/22/2020  ? HCT 42.8 12/22/2020  ? PLT 316.0 12/22/2020  ? GLUCOSE 96 12/22/2020  ? CHOL 253 (H) 12/22/2020  ? TRIG 106.0 12/22/2020  ? HDL 44.50 12/22/2020  ? LDLDIRECT 198.9 12/30/2012  ? LDLCALC 188 (H) 12/22/2020  ? ALT 11 12/22/2020  ? AST 22 12/22/2020  ? NA 142 12/22/2020  ? K 4.4 12/22/2020  ? CL 104 12/22/2020  ? CREATININE 0.91 12/22/2020  ? BUN 10 12/22/2020  ? CO2 29 12/22/2020  ? TSH 0.76 12/22/2020  ? HGBA1C 5.6 12/22/2020  ? ? ? ?Assessment & Plan:  ? ? ?See Problem List for Assessment and Plan of chronic medical problems.  ? ? ?

## 2021-06-22 ENCOUNTER — Ambulatory Visit (INDEPENDENT_AMBULATORY_CARE_PROVIDER_SITE_OTHER): Payer: Medicare PPO | Admitting: Internal Medicine

## 2021-06-22 ENCOUNTER — Other Ambulatory Visit: Payer: Self-pay

## 2021-06-22 VITALS — BP 136/78 | HR 71 | Temp 98.1°F | Ht <= 58 in | Wt 192.8 lb

## 2021-06-22 DIAGNOSIS — R7303 Prediabetes: Secondary | ICD-10-CM | POA: Diagnosis not present

## 2021-06-22 DIAGNOSIS — F419 Anxiety disorder, unspecified: Secondary | ICD-10-CM | POA: Diagnosis not present

## 2021-06-22 DIAGNOSIS — D0511 Intraductal carcinoma in situ of right breast: Secondary | ICD-10-CM | POA: Diagnosis not present

## 2021-06-22 DIAGNOSIS — Z8673 Personal history of transient ischemic attack (TIA), and cerebral infarction without residual deficits: Secondary | ICD-10-CM

## 2021-06-22 DIAGNOSIS — E782 Mixed hyperlipidemia: Secondary | ICD-10-CM | POA: Diagnosis not present

## 2021-06-22 DIAGNOSIS — I10 Essential (primary) hypertension: Secondary | ICD-10-CM | POA: Diagnosis not present

## 2021-06-22 LAB — LIPID PANEL
Cholesterol: 213 mg/dL — ABNORMAL HIGH (ref 0–200)
HDL: 40.8 mg/dL (ref >39.00)
NonHDL: 172.52
Total CHOL/HDL Ratio: 5
Triglycerides: 239 mg/dL — ABNORMAL HIGH (ref 0.0–149.0)
VLDL: 47.8 mg/dL — ABNORMAL HIGH (ref 0.0–40.0)

## 2021-06-22 LAB — COMPREHENSIVE METABOLIC PANEL
ALT: 16 U/L (ref 0–35)
AST: 24 U/L (ref 0–37)
Albumin: 4.2 g/dL (ref 3.5–5.2)
Alkaline Phosphatase: 61 U/L (ref 39–117)
BUN: 12 mg/dL (ref 6–23)
CO2: 30 mEq/L (ref 19–32)
Calcium: 9.1 mg/dL (ref 8.4–10.5)
Chloride: 103 mEq/L (ref 96–112)
Creatinine, Ser: 0.83 mg/dL (ref 0.40–1.20)
GFR: 68.79 mL/min (ref >60.00)
Glucose, Bld: 83 mg/dL (ref 70–99)
Potassium: 3.8 mEq/L (ref 3.5–5.1)
Sodium: 142 mEq/L (ref 135–145)
Total Bilirubin: 0.4 mg/dL (ref 0.2–1.2)
Total Protein: 7.7 g/dL (ref 6.0–8.3)

## 2021-06-22 LAB — HEMOGLOBIN A1C: Hgb A1c MFr Bld: 5.7 % (ref 4.6–6.5)

## 2021-06-22 LAB — LDL CHOLESTEROL, DIRECT: Direct LDL: 142 mg/dL

## 2021-06-22 MED ORDER — LATANOPROST 0.005 % OP SOLN: 1.0000 [drp] | Freq: Every day | OPHTHALMIC | 0 refills | Status: DC

## 2021-06-22 MED ORDER — ALPRAZOLAM 0.5 MG PO TABS: 0.5000 mg | ORAL_TABLET | Freq: Two times a day (BID) | ORAL | 0 refills | Status: AC | PRN

## 2021-06-22 NOTE — Assessment & Plan Note (Addendum)
Chronic ?Check a1c ?Low sugar / carb diet ?Stressed regular exercise ?Continue weight loss efforts ?She has been sticking her fingers on a daily basis and that is not needed-last A1c 5.6% ?Dexcom or freestyle Elenor [place] will not be covered since she had a diabetic-advised not to check sugars at this time ?

## 2021-06-22 NOTE — Assessment & Plan Note (Signed)
New diagnosis-diagnosed 04/2021 ?Seeing oncology at [facility] ?for lumpectomy with radioactive seed implantation next week ?

## 2021-06-22 NOTE — Assessment & Plan Note (Signed)
Chronic ?No residual deficit-incidental finding on imaging ?Continue atorvastatin 40 mg daily, aspirin 81 mg daily ?Blood pressure well controlled ?Stressed regular exercise, healthy diet ?

## 2021-06-22 NOTE — Assessment & Plan Note (Signed)
Chronic Regular exercise and healthy diet encouraged Check lipid panel  Continue atorvastatin 40 mg daily 

## 2021-06-22 NOTE — Assessment & Plan Note (Signed)
Chronic ?Blood pressure well controlled ?CMP ?Continue amlodipine 10 mg daily, HCTZ 12.5 mg daily, and losartan-HCTZ 150-12.5 mg daily ?

## 2021-06-22 NOTE — Assessment & Plan Note (Signed)
Chronic ?Discussed weight loss ?Stressed regular exercise, decrease portions, healthy diet ?

## 2021-06-22 NOTE — Assessment & Plan Note (Signed)
Chronic Controlled, Stable Continue alprazolam 0.5 mg twice daily as needed 

## 2021-06-27 ENCOUNTER — Encounter (HOSPITAL_BASED_OUTPATIENT_CLINIC_OR_DEPARTMENT_OTHER)
Admission: RE | Admit: 2021-06-27 | Discharge: 2021-06-27 | Disposition: A | Discharge status: Home / Self Care | Payer: Medicare PPO | Source: Ambulatory Visit | Attending: Surgery | Admitting: Surgery

## 2021-06-27 ENCOUNTER — Ambulatory Visit
Admission: RE | Admit: 2021-06-27 | Discharge: 2021-06-27 | Disposition: A | Discharge status: Home / Self Care | Payer: Medicare PPO | Source: Ambulatory Visit | Attending: Surgery | Admitting: Surgery

## 2021-06-27 ENCOUNTER — Other Ambulatory Visit: Payer: Self-pay | Admitting: Surgery

## 2021-06-27 DIAGNOSIS — M1990 Unspecified osteoarthritis, unspecified site: Secondary | ICD-10-CM | POA: Diagnosis not present

## 2021-06-27 DIAGNOSIS — D0511 Intraductal carcinoma in situ of right breast: Secondary | ICD-10-CM | POA: Diagnosis not present

## 2021-06-27 DIAGNOSIS — Z6841 Body Mass Index (BMI) 40.0 and over, adult: Secondary | ICD-10-CM | POA: Diagnosis not present

## 2021-06-27 DIAGNOSIS — I10 Essential (primary) hypertension: Secondary | ICD-10-CM | POA: Diagnosis not present

## 2021-06-27 DIAGNOSIS — Z79899 Other long term (current) drug therapy: Secondary | ICD-10-CM | POA: Diagnosis not present

## 2021-06-27 DIAGNOSIS — K219 Gastro-esophageal reflux disease without esophagitis: Secondary | ICD-10-CM | POA: Diagnosis not present

## 2021-06-27 DIAGNOSIS — Z7982 Long term (current) use of aspirin: Secondary | ICD-10-CM | POA: Diagnosis not present

## 2021-06-27 DIAGNOSIS — Z01818 Encounter for other preprocedural examination: Secondary | ICD-10-CM | POA: Diagnosis not present

## 2021-06-27 LAB — BASIC METABOLIC PANEL
Anion gap: 7 (ref 5–15)
BUN: 13 mg/dL (ref 8–23)
CO2: 25 mmol/L (ref 22–32)
Calcium: 9 mg/dL (ref 8.9–10.3)
Chloride: 110 mmol/L (ref 98–111)
Creatinine, Ser: 0.88 mg/dL (ref 0.44–1.00)
GFR, Estimated: >60 mL/min (ref >60)
Glucose, Bld: 92 mg/dL (ref 70–99)
Potassium: 4.1 mmol/L (ref 3.5–5.1)
Sodium: 142 mmol/L (ref 135–145)

## 2021-06-27 NOTE — Progress Notes (Signed)

## 2021-06-28 ENCOUNTER — Ambulatory Visit (HOSPITAL_BASED_OUTPATIENT_CLINIC_OR_DEPARTMENT_OTHER)
Admission: RE | Admit: 2021-06-28 | Discharge: 2021-06-28 | Disposition: A | Discharge status: Home / Self Care | Payer: Medicare PPO | Attending: Surgery | Admitting: Surgery

## 2021-06-28 ENCOUNTER — Other Ambulatory Visit: Payer: Self-pay

## 2021-06-28 ENCOUNTER — Encounter (HOSPITAL_BASED_OUTPATIENT_CLINIC_OR_DEPARTMENT_OTHER): Payer: Self-pay | Admitting: Surgery

## 2021-06-28 ENCOUNTER — Ambulatory Visit
Admission: RE | Admit: 2021-06-28 | Discharge: 2021-06-28 | Disposition: A | Discharge status: Home / Self Care | Payer: Medicare PPO | Source: Ambulatory Visit | Attending: Surgery | Admitting: Surgery

## 2021-06-28 ENCOUNTER — Ambulatory Visit (HOSPITAL_BASED_OUTPATIENT_CLINIC_OR_DEPARTMENT_OTHER): Payer: Medicare PPO | Admitting: Anesthesiology

## 2021-06-28 ENCOUNTER — Encounter (HOSPITAL_BASED_OUTPATIENT_CLINIC_OR_DEPARTMENT_OTHER)
Admission: RE | Disposition: A | Discharge status: Home / Self Care | Payer: Self-pay | Source: Home / Self Care | Attending: Surgery

## 2021-06-28 DIAGNOSIS — Z6841 Body Mass Index (BMI) 40.0 and over, adult: Secondary | ICD-10-CM

## 2021-06-28 DIAGNOSIS — D0511 Intraductal carcinoma in situ of right breast: Secondary | ICD-10-CM

## 2021-06-28 DIAGNOSIS — Z7982 Long term (current) use of aspirin: Secondary | ICD-10-CM | POA: Diagnosis not present

## 2021-06-28 DIAGNOSIS — M1990 Unspecified osteoarthritis, unspecified site: Secondary | ICD-10-CM | POA: Diagnosis not present

## 2021-06-28 DIAGNOSIS — Z79899 Other long term (current) drug therapy: Secondary | ICD-10-CM | POA: Diagnosis not present

## 2021-06-28 DIAGNOSIS — I10 Essential (primary) hypertension: Secondary | ICD-10-CM

## 2021-06-28 DIAGNOSIS — R928 Other abnormal and inconclusive findings on diagnostic imaging of breast: Secondary | ICD-10-CM | POA: Diagnosis not present

## 2021-06-28 DIAGNOSIS — K219 Gastro-esophageal reflux disease without esophagitis: Secondary | ICD-10-CM | POA: Diagnosis not present

## 2021-06-28 HISTORY — DX: Prediabetes: R73.03

## 2021-06-28 HISTORY — PX: BREAST LUMPECTOMY WITH RADIOACTIVE SEED LOCALIZATION: SHX6424

## 2021-06-28 SURGERY — BREAST LUMPECTOMY WITH RADIOACTIVE SEED LOCALIZATION
Anesthesia: General | Site: Breast | Laterality: Right

## 2021-06-28 MED ORDER — FENTANYL CITRATE (PF) 100 MCG/2ML IJ SOLN
INTRAMUSCULAR | Status: AC
  Filled 2021-06-28: qty 2

## 2021-06-28 MED ORDER — EPHEDRINE SULFATE (PRESSORS) 50 MG/ML IJ SOLN
INTRAMUSCULAR | Status: DC | PRN
  Administered 2021-06-28: 15 mg via INTRAVENOUS

## 2021-06-28 MED ORDER — PROPOFOL 10 MG/ML IV BOLUS
INTRAVENOUS | Status: DC | PRN
  Administered 2021-06-28: 180 mg via INTRAVENOUS

## 2021-06-28 MED ORDER — LIDOCAINE HCL (CARDIAC) PF 100 MG/5ML IV SOSY
PREFILLED_SYRINGE | INTRAVENOUS | Status: DC | PRN
  Administered 2021-06-28: 60 mg via INTRAVENOUS

## 2021-06-28 MED ORDER — BUPIVACAINE-EPINEPHRINE (PF) 0.25% -1:200000 IJ SOLN
INTRAMUSCULAR | Status: DC | PRN
  Administered 2021-06-28: 30 mL

## 2021-06-28 MED ORDER — LIDOCAINE 2% (20 MG/ML) 5 ML SYRINGE
INTRAMUSCULAR | Status: AC
  Filled 2021-06-28: qty 5

## 2021-06-28 MED ORDER — MIDAZOLAM HCL 2 MG/2ML IJ SOLN
INTRAMUSCULAR | Status: AC
  Filled 2021-06-28: qty 2

## 2021-06-28 MED ORDER — LACTATED RINGERS IV SOLN: INTRAVENOUS | Status: DC

## 2021-06-28 MED ORDER — FENTANYL CITRATE (PF) 100 MCG/2ML IJ SOLN
INTRAMUSCULAR | Status: DC | PRN
Start: 2021-06-28 — End: 2021-06-28
  Administered 2021-06-28: 25 ug via INTRAVENOUS
  Administered 2021-06-28: 50 ug via INTRAVENOUS
  Administered 2021-06-28: 25 ug via INTRAVENOUS

## 2021-06-28 MED ORDER — ONDANSETRON HCL 4 MG/2ML IJ SOLN
INTRAMUSCULAR | Status: AC
  Filled 2021-06-28: qty 2

## 2021-06-28 MED ORDER — GENTAMICIN SULFATE 40 MG/ML IJ SOLN
INTRAMUSCULAR | Status: DC | PRN
  Administered 2021-06-28: 500 mL

## 2021-06-28 MED ORDER — AMISULPRIDE (ANTIEMETIC) 5 MG/2ML IV SOLN: 10.0000 mg | Freq: Once | INTRAVENOUS | Status: DC | PRN

## 2021-06-28 MED ORDER — DEXAMETHASONE SODIUM PHOSPHATE 4 MG/ML IJ SOLN
INTRAMUSCULAR | Status: DC | PRN
Start: 2021-06-28 — End: 2021-06-28
  Administered 2021-06-28: 5 mg via INTRAVENOUS

## 2021-06-28 MED ORDER — CHLORHEXIDINE GLUCONATE CLOTH 2 % EX PADS
6.0000 | MEDICATED_PAD | Freq: Once | CUTANEOUS | Status: AC
Start: 2021-06-28 — End: 2021-06-28
  Administered 2021-06-28: 6 via TOPICAL

## 2021-06-28 MED ORDER — OXYCODONE HCL 5 MG PO TABS: 5.0000 mg | ORAL_TABLET | Freq: Four times a day (QID) | ORAL | 0 refills | Status: DC | PRN

## 2021-06-28 MED ORDER — CEFAZOLIN IN SODIUM CHLORIDE 3-0.9 GM/100ML-% IV SOLN
3.0000 g | INTRAVENOUS | Status: DC
Start: 2021-06-28 — End: 2021-06-28

## 2021-06-28 MED ORDER — ONDANSETRON HCL 4 MG/2ML IJ SOLN
INTRAMUSCULAR | Status: DC | PRN
Start: 2021-06-28 — End: 2021-06-28
  Administered 2021-06-28: 4 mg via INTRAVENOUS

## 2021-06-28 MED ORDER — ACETAMINOPHEN 500 MG PO TABS
ORAL_TABLET | ORAL | Status: AC
  Filled 2021-06-28: qty 2

## 2021-06-28 MED ORDER — CEFAZOLIN SODIUM-DEXTROSE 2-4 GM/100ML-% IV SOLN
2.0000 g | Freq: Once | INTRAVENOUS | Status: AC
Start: 2021-06-28 — End: 2021-06-28
  Administered 2021-06-28: 2 g via INTRAVENOUS

## 2021-06-28 MED ORDER — CHLORHEXIDINE GLUCONATE CLOTH 2 % EX PADS
6.0000 | MEDICATED_PAD | Freq: Once | CUTANEOUS | Status: DC
Start: 2021-06-28 — End: 2021-06-28

## 2021-06-28 MED ORDER — KETOROLAC TROMETHAMINE 30 MG/ML IJ SOLN
INTRAMUSCULAR | Status: DC | PRN
Start: 2021-06-28 — End: 2021-06-28
  Administered 2021-06-28: 30 mg via INTRAVENOUS

## 2021-06-28 MED ORDER — FENTANYL CITRATE (PF) 100 MCG/2ML IJ SOLN
INTRAMUSCULAR | Status: AC
Start: 2021-06-28 — End: ?
  Filled 2021-06-28: qty 2

## 2021-06-28 MED ORDER — MIDAZOLAM HCL 5 MG/5ML IJ SOLN
INTRAMUSCULAR | Status: DC | PRN
  Administered 2021-06-28: 1 mg via INTRAVENOUS

## 2021-06-28 MED ORDER — ROCURONIUM BROMIDE 10 MG/ML (PF) SYRINGE
PREFILLED_SYRINGE | INTRAVENOUS | Status: AC
  Filled 2021-06-28: qty 10

## 2021-06-28 MED ORDER — FENTANYL CITRATE (PF) 100 MCG/2ML IJ SOLN
25.0000 ug | INTRAMUSCULAR | Status: DC | PRN
  Administered 2021-06-28 (×2): 50 ug via INTRAVENOUS

## 2021-06-28 MED ORDER — ACETAMINOPHEN 500 MG PO TABS
1000.0000 mg | ORAL_TABLET | ORAL | Status: AC
  Administered 2021-06-28: 1000 mg via ORAL

## 2021-06-28 MED ORDER — DEXAMETHASONE SODIUM PHOSPHATE 10 MG/ML IJ SOLN
INTRAMUSCULAR | Status: AC
  Filled 2021-06-28: qty 1

## 2021-06-28 MED ORDER — SODIUM CHLORIDE 0.9 % IV SOLN
INTRAVENOUS | Status: AC
  Filled 2021-06-28: qty 10

## 2021-06-28 SURGICAL SUPPLY — 43 items
ADH SKN CLS APL DERMABOND .7 (GAUZE/BANDAGES/DRESSINGS) ×1
APL PRP STRL LF DISP 70% ISPRP (MISCELLANEOUS) ×1
APPLIER CLIP 9.375 MED OPEN (MISCELLANEOUS) ×2
APR CLP MED 9.3 20 MLT OPN (MISCELLANEOUS) ×1
BINDER BREAST 3XL (GAUZE/BANDAGES/DRESSINGS) ×1 IMPLANT
BLADE SURG 15 STRL LF DISP TIS (BLADE) ×1 IMPLANT
BLADE SURG 15 STRL SS (BLADE) ×2
CANISTER SUCT 1200ML W/VALVE (MISCELLANEOUS) ×1 IMPLANT
CHLORAPREP W/TINT 26 (MISCELLANEOUS) ×2 IMPLANT
CLIP APPLIE 9.375 MED OPEN (MISCELLANEOUS) IMPLANT
COVER BACK TABLE 60X90IN (DRAPES) ×2 IMPLANT
COVER MAYO STAND STRL (DRAPES) ×2 IMPLANT
COVER PROBE W GEL 5X96 (DRAPES) ×2 IMPLANT
DERMABOND ADVANCED (GAUZE/BANDAGES/DRESSINGS) ×1
DERMABOND ADVANCED .7 DNX12 (GAUZE/BANDAGES/DRESSINGS) ×1 IMPLANT
DRAPE LAPAROTOMY 100X72 PEDS (DRAPES) ×2 IMPLANT
DRAPE UTILITY XL STRL (DRAPES) ×2 IMPLANT
ELECT COATED BLADE 2.86 ST (ELECTRODE) ×2 IMPLANT
ELECT REM PT RETURN 9FT ADLT (ELECTROSURGICAL) ×2
ELECTRODE REM PT RTRN 9FT ADLT (ELECTROSURGICAL) ×1 IMPLANT
GLOVE BIOGEL PI IND STRL 8 (GLOVE) ×1 IMPLANT
GLOVE BIOGEL PI INDICATOR 8 (GLOVE) ×1
GLOVE ECLIPSE 8.0 STRL XLNG CF (GLOVE) ×2 IMPLANT
GOWN STRL REUS W/ TWL LRG LVL3 (GOWN DISPOSABLE) ×2 IMPLANT
GOWN STRL REUS W/ TWL XL LVL3 (GOWN DISPOSABLE) ×1 IMPLANT
GOWN STRL REUS W/TWL LRG LVL3 (GOWN DISPOSABLE) ×4
GOWN STRL REUS W/TWL XL LVL3 (GOWN DISPOSABLE) ×2
KIT MARKER MARGIN INK (KITS) ×2 IMPLANT
NDL HYPO 25X1 1.5 SAFETY (NEEDLE) ×1 IMPLANT
NEEDLE HYPO 25X1 1.5 SAFETY (NEEDLE) ×2 IMPLANT
NS IRRIG 1000ML POUR BTL (IV SOLUTION) ×2 IMPLANT
PACK BASIN DAY SURGERY FS (CUSTOM PROCEDURE TRAY) ×2 IMPLANT
PENCIL SMOKE EVACUATOR (MISCELLANEOUS) ×2 IMPLANT
SLEEVE SCD COMPRESS KNEE MED (STOCKING) ×2 IMPLANT
SPONGE T-LAP 4X18 ~~LOC~~+RFID (SPONGE) ×3 IMPLANT
SUT MNCRL AB 4-0 PS2 18 (SUTURE) ×2 IMPLANT
SUT SILK 2 0 SH (SUTURE) IMPLANT
SUT VICRYL 3-0 CR8 SH (SUTURE) ×2 IMPLANT
SYR CONTROL 10ML LL (SYRINGE) ×2 IMPLANT
TOWEL GREEN STERILE FF (TOWEL DISPOSABLE) ×2 IMPLANT
TRAY FAXITRON CT DISP (TRAY / TRAY PROCEDURE) ×2 IMPLANT
TUBE CONNECTING 20X1/4 (TUBING) ×1 IMPLANT
YANKAUER SUCT BULB TIP NO VENT (SUCTIONS) ×1 IMPLANT

## 2021-06-28 NOTE — H&P (Signed)
Chief Complaint: New Consultation (Right Cancer) ? ? ?History of Present Illness: ?Shelley Hayes is a 76 y.o. female who is seen today as an office consultation at the request of Dr. Gaetano Net for evaluation of New Consultation (Right Cancer) ?.  ? ?Patient here today to be seen due to mammographic abnormality right breast. She has a history of nipple discharge that is clear from her nipple on the right for well over 5 years. Mammogram on the right showed 2 things. 1 was a group of calcifications 5 mm in size core biopsy shown to be intermediate grade DCIS. The other was a fibroadenoma both were biopsied. Denies a history of breast pain except that her breast is sore for the biopsy. She has very large pendulous breast she states. ? ?Review of Systems: ?A complete review of systems was obtained from the patient. I have reviewed this information and discussed as appropriate with the patient. See HPI as well for other ROS. ? ? ? ?Medical History: ?Past Medical History:  ?Diagnosis Date  ? Arthritis  ? Glaucoma (increased eye pressure)  ? Hypertension  ? ?There is no problem list on file for this patient. ? ?Past Surgical History:  ?Procedure Laterality Date  ? LAPAROSCOPIC TUBAL LIGATION 1978  ? ? ?Allergies  ?Allergen Reactions  ? Hydrocodone-Homatropine Other (See Comments)  ?"don't give me that stuff - i'll get hooked!"  ? Losartan Other (See Comments)  ?palpitations  ? Spironolactone Other (See Comments)  ?Fatigue/malaise  ? ?Current Outpatient Medications on File Prior to Visit  ?Medication Sig Dispense Refill  ? albuterol 90 mcg/actuation inhaler albuterol sulfate HFA 90 mcg/actuation aerosol inhaler  ? ALPRAZolam (XANAX) 0.5 MG tablet alprazolam 0.5 mg tablet  ? amLODIPine (NORVASC) 10 MG tablet amlodipine 10 mg tablet  ? ascorbic acid, vitamin C, (VITAMIN C) 1000 MG tablet Take by mouth  ? aspirin 81 MG chewable tablet Take 81 mg by mouth once daily  ? atorvastatin (LIPITOR) 40 MG tablet atorvastatin 40 mg tablet   ? cyanocobalamin (VITAMIN B12) 100 MCG tablet Vitamin B12  ? hydroCHLOROthiazide (MICROZIDE) 12.5 mg capsule hydrochlorothiazide 12.5 mg capsule  ? irbesartan-hydrochlorothiazide (AVALIDE) 150-12.5 mg tablet irbesartan 150 mg-hydrochlorothiazide 12.5 mg tablet  ? latanoprost (XALATAN) 0.005 % ophthalmic solution latanoprost 0.005 % eye drops  ? meloxicam (MOBIC) 7.5 MG tablet meloxicam 7.5 mg tablet ?TAKE 1 TABLET BY MOUTH ONCE DAILY  ? multivitamin tablet Take 1 tablet by mouth once daily  ? potassium chloride (KLOR-CON) 20 MEQ ER tablet potassium chloride ER 20 mEq tablet,extended release(part/cryst)  ? VITAMIN D3 ORAL Vitamin D  ? ?No current facility-administered medications on file prior to visit.  ? ?History reviewed. No pertinent family history.  ? ?Social History  ? ?Tobacco Use  ?Smoking Status Never  ?Smokeless Tobacco Never  ? ? ?Social History  ? ?Socioeconomic History  ? Marital status: Divorced  ?Tobacco Use  ? Smoking status: Never  ? Smokeless tobacco: Never  ?Vaping Use  ? Vaping Use: Never used  ?Substance and Sexual Activity  ? Alcohol use: Not Currently  ? Drug use: Never  ? ?Objective:  ? ?Vitals:  ?04/29/21 1003  ?BP: (!) 140/82  ?Pulse: 85  ?Temp: 36.3 ?C (97.4 ?F)  ?SpO2: 96%  ?Weight: 86.2 kg (190 lb)  ?Height: 144.8 cm ('4\' 9"'$ )  ? ?Body mass index is 41.12 kg/m?. ? ?Physical Exam ?Constitutional:  ?Appearance: Normal appearance.  ?Eyes:  ?Pupils: Pupils are equal, round, and reactive to light.  ?Cardiovascular:  ?Rate and  Rhythm: Normal rate.  ?Chest:  ?Breasts: ?Right: No bleeding, inverted nipple or mass.  ?Left: No bleeding, inverted nipple or mass.  ? ?Musculoskeletal:  ?Cervical back: Normal range of motion.  ?Lymphadenopathy:  ?Upper Body:  ?Right upper body: No supraclavicular or axillary adenopathy.  ?Left upper body: No supraclavicular or axillary adenopathy.  ? ? ? ?Labs, Imaging and Diagnostic Testing: ?Recall from screening mammography, calcifications ?involving the outer RIGHT  breast at middle to posterior depth. ?  ?EXAM: ?DIGITAL DIAGNOSTIC UNILATERAL RIGHT MAMMOGRAM ?  ?TECHNIQUE: ?Right digital diagnostic mammography was performed. Mammographic ?images were processed with CAD. ?  ?COMPARISON: Previous exam(s). ?  ?ACR Breast Density Category b: There are scattered areas of ?fibroglandular density. ?  ?FINDINGS: ?Spot magnification CC and mediolateral views of the calcifications ?and full field mediolateral and medially rolled CC views were ?obtained. ?  ?Spot magnification views confirm an approximate 5 mm group of fine ?linear calcifications which are associated with focal duct ectasia ?in the outer breast at middle to posterior depth. There are benign ?coarse linear calcifications elsewhere associated with this focal ?duct ectasia, but the calcifications in the index group are fine ?linear in morphology. ?  ?IMPRESSION: ?Indeterminate 5 mm group of calcifications associated with focal ?duct ectasia involving the outer RIGHT breast at middle to posterior ?depth. ?  ?RECOMMENDATION: ?Stereotactic tomosynthesis core needle biopsy of the indeterminate 5 ?mm group of ductal calcifications in the RIGHT breast. ?  ?I have discussed the findings and recommendations with the patient. ?The stereotactic tomosynthesis core needle biopsy procedure was ?discussed with the patient and her questions were answered. She ?wishes to proceed with the biopsy which has been scheduled by the ?Breast Imaging staff. ?  ?BI-RADS CATEGORY 4: Suspicious. ?  ?  ?Electronically Signed ?By: Evangeline Dakin M.D. ?On: 04/06/2021 10:44 ?Diagnosis ?Breast, right, needle core biopsy, Calcifications, retroareolar, ribbon clip ?- FIBROCYSTIC CHANGE WITH APOCRINE METAPLASIA AND CALCIFICATIONS. SEE NOTE ?- NEGATIVE FOR CARCINOMA ? ?ADDITIONAL INFORMATION: ?PROGNOSTIC INDICATORS ?Results: ?IMMUNOHISTOCHEMICAL AND MORPHOMETRIC ANALYSIS PERFORMED MANUALLY ?Estrogen Receptor: 95%, POSITIVE, STRONG STAINING  INTENSITY ?Progesterone Receptor: 95%, POSITIVE, STRONG STAINING INTENSITY ?REFERENCE RANGE ESTROGEN RECEPTOR ?NEGATIVE 0% ?POSITIVE =>1% ?REFERENCE RANGE PROGESTERONE RECEPTOR ?NEGATIVE 0% ?POSITIVE =>1% ?All controls stained appropriately ?Thressa Sheller MD ?Pathologist, Electronic Signature ?( Signed 04/20/2021) ?FINAL DIAGNOSIS ?Diagnosis ?Breast, right, needle core biopsy, outer, coil clip ?- DUCTAL CARCINOMA IN SITU, INTERMEDIATE GRADE WITH CALCIFICATIONS. SEE NOTE ?Diagnosis Note ?DCIS measures 0.4 cm in greatest linear dimension. Dr. Saralyn Pilar reviewed the case and concurs with the diagnosis. A ?breast prognostic profile (ER, PR) is pending and will be reported in an addendum. [facility] was notified on 04/19/2021. ? ?Assessment and Plan:  ? ?Diagnoses and all orders for this visit: ? ?Ductal carcinoma in situ (DCIS) of right breast ?- Ambulatory Referral to Radiation Oncology ?- Ambulatory Referral to Oncology-Medical ? ? ? ?Discussed surgical options of lumpectomy mastectomy. Discussed potential treatments with antiestrogens or radiation therapy. She is undecided about what she wants to do. Also discussed potential breast reduction if she wanted to do that with lumpectomy. She will meet with medical and radiation oncology but she is a good lumpectomy candidate given her breast size which I recommended to her. She is undecided but will let me know what she wants to do later time when she talk to the specialist. ? ?No follow-ups on file. ? ?Kennieth Francois, MD  ?

## 2021-06-28 NOTE — Anesthesia Procedure Notes (Signed)
Procedure Name: LMA Insertion ?Date/Time: 06/28/2021 12:21 PM ?Performed by: Tawni Millers, CRNA ?Pre-anesthesia Checklist: Patient identified, Emergency Drugs available, Suction available and Patient being monitored ?Patient Re-evaluated:Patient Re-evaluated prior to induction ?Oxygen Delivery Method: Circle system utilized ?Preoxygenation: Pre-oxygenation with 100% oxygen ?Induction Type: IV induction ?Ventilation: Mask ventilation without difficulty ?LMA: LMA inserted ?LMA Size: 3.0 ?Number of attempts: 1 ?Airway Equipment and Method: Bite block ?Placement Confirmation: positive ETCO2 ?Tube secured with: Tape ?Dental Injury: Teeth and Oropharynx as per pre-operative assessment  ? ? ? ? ?

## 2021-06-28 NOTE — Anesthesia Preprocedure Evaluation (Addendum)
Anesthesia Evaluation  ?Patient identified by MRN, date of birth, ID band ?Patient awake ? ? ? ?Reviewed: ?Allergy & Precautions, NPO status , Patient's Chart, lab work & pertinent test results ? ?Airway ?Mallampati: II ? ?TM Distance: >3 FB ?Neck ROM: Full ? ? ? Dental ? ?(+) Dental Advisory Given ?  ?Pulmonary ?neg pulmonary ROS,  ?  ?breath sounds clear to auscultation ? ? ? ? ? ? Cardiovascular ?hypertension, Pt. on medications ? ?Rhythm:Regular Rate:Normal ? ? ?  ?Neuro/Psych ?negative neurological ROS ?   ? GI/Hepatic ?Neg liver ROS, GERD  ,  ?Endo/Other  ?Morbid obesity ? Renal/GU ?negative Renal ROS  ? ?  ?Musculoskeletal ? ?(+) Arthritis ,  ? Abdominal ?  ?Peds ? Hematology ?negative hematology ROS ?(+)   ?Anesthesia Other Findings ? ? Reproductive/Obstetrics ? ?  ? ? ? ? ? ? ? ? ? ? ? ? ? ?  ?  ? ? ? ? ? ? ? ? ?Anesthesia Physical ?Anesthesia Plan ? ?ASA: 3 ? ?Anesthesia Plan: General  ? ?Post-op Pain Management: Tylenol PO (pre-op)* and Toradol IV (intra-op)*  ? ?Induction: Intravenous ? ?PONV Risk Score and Plan: 3 and Dexamethasone, Ondansetron and Treatment may vary due to age or medical condition ? ?Airway Management Planned: LMA ? ?Additional Equipment:  ? ?Intra-op Plan:  ? ?Post-operative Plan: Extubation in OR ? ?Informed Consent: I have reviewed the patients History and Physical, chart, labs and discussed the procedure including the risks, benefits and alternatives for the proposed anesthesia with the patient or authorized representative who has indicated his/her understanding and acceptance.  ? ? ? ?Dental advisory given ? ?Plan Discussed with: CRNA ? ?Anesthesia Plan Comments:   ? ? ? ? ? ? ?Anesthesia Quick Evaluation ? ?

## 2021-06-28 NOTE — Op Note (Signed)
Preoperative diagnosis: Right breast DCIS upper outer quadrant ? ?Postoperative diagnosis: Same ? ?Procedure: Seed localized lumpectomy right breast utilizing 3 localizing seeds ? ?Surgeon: Erroll Luna, MD ? ?Anesthesia: LMA with 0.25% Marcaine with epinephrine ? ?EBL: 20 cc ? ?Specimen right breast tissue with 3 seeds and 2 clips to pathology ? ?Drains: None ? ?IV fluids: Per anesthesia record ? ?Indications for procedure: The patient is a 59 old female with right breast DCIS upper outer quadrant.  She presents for breast conserving surgery.  Of note she also is going to have a breast reduction by plastic surgery.  She was seen by plastic surgery and she was marked.  Of note she had a singular duct that was noted yesterday at her seed localizing appointment that extended to the nipple.  This was felt to be suspicious by the radiologist.  We had a conversation about how to handle this I recommended placing 2 seeds along the duct and excise the entire duct system up to the nipple to remove this area of disease.  The patient was met with and this was discussed with her as well and she understood and agreed to proceed.The procedure has been discussed with the patient. The risks,  nipple loss, cosmesis , re excision  benefits and alternatives to surgery have been discussed. The patient understands the reason for the procedure and agrees to proceed. All questions are answered today to the best of my ability.  ? ?Description of procedure: The patient was met in the holding area and questions were answered.  The right breast was marked as the correct site.  Films were available for review.  Of note she was seen by plastic surgery and she was marked.  I reviewed this with the plastic surgeon.  She was then taken back to the operating room.  She was placed supine upon the OR table.  After induction of general anesthesia, the right breast was prepped and draped in a sterile fashion and timeout performed.  Proper patient,  site and procedure were verified and she received appropriate preoperative antibiotics.  Neoprobe was used to identify all 3 seeds which were in the lateral to right upper quadrant of the breast.  Neoprobe was used in a marking pen was used to mark the area.  A transverse incision was made with care taking to stay as far away from the nipple as possible.  Of note this duct did extend up to the nipple.  Incision was made and dissection was carried around the tissue.  This was taken as 1 specimen.  The entire specimen was excised from the base the nipple to the lateralmost extent of the seeds.  Margins were grossly negative.  This was inked.  The Faxitron image revealed all 3 seeds to be present.  She had 2 clips previously that were also in the specimen noted.  This was passed off the field.  The cavity was then irrigated.  Clips were placed deep in the breast tissue.  Of note she will have reduction as long as her margins are clear.  I then irrigated out the incision.  He was then closed with a deep layer of 3-0 Vicryl.  4 Monocryl was used to close the skin in a subcuticular fashion.  Breast binder placed.  Dermabond was applied for skin coverage.  All counts were correct.  Patient was in extubated taken recovery in satisfactory condition. ?

## 2021-06-28 NOTE — Discharge Instructions (Addendum)
[facility] ?Office Phone Number [phone] ? ?BREAST BIOPSY/ PARTIAL MASTECTOMY: POST OP INSTRUCTIONS ? ?Always review your discharge instruction sheet given to you by the facility where your surgery was performed. ? ?IF YOU HAVE DISABILITY OR FAMILY LEAVE FORMS, YOU MUST BRING THEM TO THE OFFICE FOR PROCESSING.  DO NOT GIVE THEM TO YOUR DOCTOR. ? ?A prescription for pain medication may be given to you upon discharge.  Take your pain medication as prescribed, if needed.  If narcotic pain medicine is not needed, then you may take acetaminophen (Tylenol) or ibuprofen (Advil) as needed. ?Take your usually prescribed medications unless otherwise directed ?If you need a refill on your pain medication, please contact your pharmacy.  They will contact our office to request authorization.  Prescriptions will not be filled after 5pm or on week-ends. ?You should eat very light the first 24 hours after surgery, such as soup, crackers, pudding, etc.  Resume your normal diet the day after surgery. ?Most patients will experience some swelling and bruising in the breast.  Ice packs and a good support bra will help.  Swelling and bruising can take several days to resolve.  ?It is common to experience some constipation if taking pain medication after surgery.  Increasing fluid intake and taking a stool softener will usually help or prevent this problem from occurring.  A mild laxative (Milk of Magnesia or Miralax) should be taken according to package directions if there are no bowel movements after 48 hours. ?Unless discharge instructions indicate otherwise, you may remove your bandages 24-48 hours after surgery, and you may shower at that time.  You may have steri-strips (small skin tapes) in place directly over the incision.  These strips should be left on the skin for 7-10 days.  If your surgeon used skin glue on the incision, you may shower in 24 hours.  The glue will flake off over the next 2-3 weeks.  Any  sutures or staples will be removed at the office during your follow-up visit. ?ACTIVITIES:  You may resume regular daily activities (gradually increasing) beginning the next day.  Wearing a good support bra or sports bra minimizes pain and swelling.  You may have sexual intercourse when it is comfortable. ?You may drive when you no longer are taking prescription pain medication, you can comfortably wear a seatbelt, and you can safely maneuver your car and apply brakes. ?RETURN TO WORK:  ______________________________________________________________________________________ ?You should see your doctor in the office for a follow-up appointment approximately two weeks after your surgery.  Your doctor?s nurse will typically make your follow-up appointment when she calls you with your pathology report.  Expect your pathology report 2-3 business days after your surgery.  You may call to check if you do not hear from [place] after three days. ?OTHER INSTRUCTIONS: _______________________________________________________________________________________________ _____________________________________________________________________________________________________________________________________ ?_____________________________________________________________________________________________________________________________________ ?_____________________________________________________________________________________________________________________________________ ? ?WHEN TO CALL YOUR DOCTOR: ?Fever over 101.0 ?Nausea and/or vomiting. ?Extreme swelling or bruising. ?Continued bleeding from incision. ?Increased pain, redness, or drainage from the incision. ? ?The clinic staff is available to answer your questions during regular business hours.  Please don?t hesitate to call and ask to speak to one of the nurses for clinical concerns.  If you have a medical emergency, go to the nearest emergency room or call 911.  A surgeon from [facility] is always on call at the hospital. ? ?For further questions, please visit [email or web address]   ? ? ?Post Anesthesia Home Care Instructions ? ?Activity: ?Get plenty of rest for the remainder  of the day. A responsible individual must stay with you for 24 hours following the procedure.  ?For the next 24 hours, DO NOT: ?-Drive a car ?-Paediatric nurse ?-Drink alcoholic beverages ?-Take any medication unless instructed by your physician ?-Make any legal decisions or sign important papers. ? ?Meals: ?Start with liquid foods such as gelatin or soup. Progress to regular foods as tolerated. Avoid greasy, spicy, heavy foods. If nausea and/or vomiting occur, drink only clear liquids until the nausea and/or vomiting subsides. Call your physician if vomiting continues. ? ?Special Instructions/Symptoms: ?Your throat may feel dry or sore from the anesthesia or the breathing tube placed in your throat during surgery. If this causes discomfort, gargle with warm salt water. The discomfort should disappear within 24 hours. ? ?May have Tylenol at 4:45pm today ?May have Ibuprofen at 7pm today ?    ?

## 2021-06-28 NOTE — Transfer of Care (Signed)
Immediate Anesthesia Transfer of Care Note ? ?Patient: Shelley Hayes ? ?Procedure(s) Performed: RIGHT BREAST LUMPECTOMY WITH RADIOACTIVE SEED LOCALIZATION (Right: Breast) ? ?Patient Location: PACU ? ?Anesthesia Type:General ? ?Level of Consciousness: awake ? ?Airway & Oxygen Therapy: Patient Spontanous Breathing and Patient connected to face mask oxygen ? ?Post-op Assessment: Report given to RN and Post -op Vital signs reviewed and stable ? ?Post vital signs: Reviewed and stable ? ?Last Vitals:  ?Vitals Value Taken Time  ?BP    ?Temp    ?Pulse 87 06/28/21 1312  ?Resp 22 06/28/21 1312  ?SpO2 97 % 06/28/21 1312  ?Vitals shown include unvalidated device data. ? ?Last Pain:  ?Vitals:  ? 06/28/21 0936  ?TempSrc: Oral  ?PainSc: 0-No pain  ?   ? ?Patients Stated Pain Goal: 3 (06/28/21 0936) ? ?Complications: No notable events documented. ?

## 2021-06-28 NOTE — Anesthesia Postprocedure Evaluation (Signed)
Anesthesia Post Note ? ?Patient: Shelley Hayes ? ?Procedure(s) Performed: RIGHT BREAST LUMPECTOMY WITH RADIOACTIVE SEED LOCALIZATION (Right: Breast) ? ?  ? ?Patient location during evaluation: PACU ?Anesthesia Type: General ?Level of consciousness: awake and alert ?Pain management: pain level controlled ?Vital Signs Assessment: post-procedure vital signs reviewed and stable ?Respiratory status: spontaneous breathing, nonlabored ventilation, respiratory function stable and patient connected to nasal cannula oxygen ?Cardiovascular status: blood pressure returned to baseline and stable ?Postop Assessment: no apparent nausea or vomiting ?Anesthetic complications: no ? ? ?No notable events documented. ? ?Last Vitals:  ?Vitals:  ? 06/28/21 1415 06/28/21 1426  ?BP:  125/70  ?Pulse: 82 82  ?Resp: 17 16  ?Temp:  36.9 ?C  ?SpO2: 92% 95%  ?  ?Last Pain:  ?Vitals:  ? 06/28/21 1426  ?TempSrc: Oral  ?PainSc: 2   ? ? ?  ?  ?  ?  ?  ?  ? ?Suzette Battiest E ? ? ? ? ?

## 2021-06-29 ENCOUNTER — Other Ambulatory Visit: Payer: Self-pay

## 2021-06-29 ENCOUNTER — Encounter (HOSPITAL_BASED_OUTPATIENT_CLINIC_OR_DEPARTMENT_OTHER): Payer: Self-pay | Admitting: Surgery

## 2021-06-30 ENCOUNTER — Encounter (HOSPITAL_COMMUNITY): Payer: Self-pay

## 2021-06-30 LAB — SURGICAL PATHOLOGY

## 2021-07-04 NOTE — Anesthesia Preprocedure Evaluation (Addendum)
Anesthesia Evaluation  ?Patient identified by MRN, date of birth, ID band ?Patient awake ? ? ? ?Reviewed: ?Allergy & Precautions, NPO status , Patient's Chart, lab work & pertinent test results ? ?Airway ?Mallampati: II ? ?TM Distance: >3 FB ?Neck ROM: Full ? ? ? Dental ? ?(+) Missing, Upper Dentures ?  ?Pulmonary ?neg pulmonary ROS,  ?  ?Pulmonary exam normal ? ? ? ? ? ? ? Cardiovascular ?hypertension, Pt. on medications ?Normal cardiovascular exam ? ? ?  ?Neuro/Psych ?Anxiety  Neuromuscular disease   ? GI/Hepatic ?negative GI ROS, Neg liver ROS,   ?Endo/Other  ?Morbid obesity ? Renal/GU ?negative Renal ROS  ? ?  ?Musculoskeletal ? ?(+) Arthritis ,  ? Abdominal ?(+) + obese,   ?Peds ? Hematology ?negative hematology ROS ?(+)   ?Anesthesia Other Findings ?right breast DCIS, macromastia, chronic neck and back pain ? Reproductive/Obstetrics ? ?  ? ? ? ? ? ? ? ? ? ? ? ? ? ?  ?  ? ? ? ? ? ? ? ?Anesthesia Physical ?Anesthesia Plan ? ?ASA: 3 ? ?Anesthesia Plan: General  ? ?Post-op Pain Management:   ? ?Induction: Intravenous ? ?PONV Risk Score and Plan: 3 and Ondansetron, Dexamethasone and Treatment may vary due to age or medical condition ? ?Airway Management Planned: Oral ETT ? ?Additional Equipment:  ? ?Intra-op Plan:  ? ?Post-operative Plan: Extubation in OR ? ?Informed Consent: I have reviewed the patients History and Physical, chart, labs and discussed the procedure including the risks, benefits and alternatives for the proposed anesthesia with the patient or authorized representative who has indicated his/her understanding and acceptance.  ? ? ? ?Dental advisory given ? ?Plan Discussed with: CRNA ? ?Anesthesia Plan Comments:   ? ? ? ? ? ?Anesthesia Quick Evaluation ? ?

## 2021-07-05 ENCOUNTER — Ambulatory Visit (HOSPITAL_BASED_OUTPATIENT_CLINIC_OR_DEPARTMENT_OTHER): Payer: Medicare PPO | Admitting: Anesthesiology

## 2021-07-05 ENCOUNTER — Encounter (HOSPITAL_BASED_OUTPATIENT_CLINIC_OR_DEPARTMENT_OTHER): Payer: Self-pay | Admitting: Plastic Surgery

## 2021-07-05 ENCOUNTER — Ambulatory Visit (HOSPITAL_BASED_OUTPATIENT_CLINIC_OR_DEPARTMENT_OTHER)
Admission: RE | Admit: 2021-07-05 | Discharge: 2021-07-05 | Disposition: A | Discharge status: Home / Self Care | Payer: Medicare PPO | Attending: Plastic Surgery | Admitting: Plastic Surgery

## 2021-07-05 ENCOUNTER — Other Ambulatory Visit: Payer: Self-pay

## 2021-07-05 ENCOUNTER — Encounter (HOSPITAL_BASED_OUTPATIENT_CLINIC_OR_DEPARTMENT_OTHER)
Admission: RE | Disposition: A | Discharge status: Home / Self Care | Payer: Self-pay | Source: Home / Self Care | Attending: Plastic Surgery

## 2021-07-05 DIAGNOSIS — M542 Cervicalgia: Secondary | ICD-10-CM | POA: Insufficient documentation

## 2021-07-05 DIAGNOSIS — I10 Essential (primary) hypertension: Secondary | ICD-10-CM | POA: Diagnosis not present

## 2021-07-05 DIAGNOSIS — M549 Dorsalgia, unspecified: Secondary | ICD-10-CM | POA: Diagnosis not present

## 2021-07-05 DIAGNOSIS — F419 Anxiety disorder, unspecified: Secondary | ICD-10-CM | POA: Diagnosis not present

## 2021-07-05 DIAGNOSIS — G8929 Other chronic pain: Secondary | ICD-10-CM | POA: Insufficient documentation

## 2021-07-05 DIAGNOSIS — Z6841 Body Mass Index (BMI) 40.0 and over, adult: Secondary | ICD-10-CM | POA: Diagnosis not present

## 2021-07-05 DIAGNOSIS — D0511 Intraductal carcinoma in situ of right breast: Secondary | ICD-10-CM | POA: Insufficient documentation

## 2021-07-05 DIAGNOSIS — N6021 Fibroadenosis of right breast: Secondary | ICD-10-CM | POA: Diagnosis not present

## 2021-07-05 DIAGNOSIS — N6022 Fibroadenosis of left breast: Secondary | ICD-10-CM | POA: Diagnosis not present

## 2021-07-05 DIAGNOSIS — N62 Hypertrophy of breast: Secondary | ICD-10-CM

## 2021-07-05 HISTORY — PX: BREAST RECONSTRUCTION: SHX9

## 2021-07-05 HISTORY — PX: BREAST REDUCTION SURGERY: SHX8

## 2021-07-05 LAB — GLUCOSE, CAPILLARY: Glucose-Capillary: 149 mg/dL — ABNORMAL HIGH (ref 70–99)

## 2021-07-05 SURGERY — RECONSTRUCTION, BREAST
Anesthesia: General | Site: Breast | Laterality: Right

## 2021-07-05 MED ORDER — IPRATROPIUM-ALBUTEROL 0.5-2.5 (3) MG/3ML IN SOLN
3.0000 mL | Freq: Once | RESPIRATORY_TRACT | Status: AC
  Administered 2021-07-05: 3 mL via RESPIRATORY_TRACT

## 2021-07-05 MED ORDER — ACETAMINOPHEN 500 MG PO TABS
1000.0000 mg | ORAL_TABLET | ORAL | Status: AC
  Administered 2021-07-05: 1000 mg via ORAL

## 2021-07-05 MED ORDER — CHLORHEXIDINE GLUCONATE CLOTH 2 % EX PADS: 6.0000 | MEDICATED_PAD | Freq: Once | CUTANEOUS | Status: DC

## 2021-07-05 MED ORDER — PROPOFOL 10 MG/ML IV BOLUS
INTRAVENOUS | Status: DC | PRN
  Administered 2021-07-05: 100 mg via INTRAVENOUS

## 2021-07-05 MED ORDER — ROCURONIUM BROMIDE 100 MG/10ML IV SOLN
INTRAVENOUS | Status: DC | PRN
  Administered 2021-07-05: 30 mg via INTRAVENOUS
  Administered 2021-07-05: 40 mg via INTRAVENOUS

## 2021-07-05 MED ORDER — PHENYLEPHRINE HCL (PRESSORS) 10 MG/ML IV SOLN
INTRAVENOUS | Status: AC
  Filled 2021-07-05: qty 1

## 2021-07-05 MED ORDER — CEFAZOLIN SODIUM-DEXTROSE 2-3 GM-%(50ML) IV SOLR
INTRAVENOUS | Status: DC | PRN
  Administered 2021-07-05: 2 g via INTRAVENOUS

## 2021-07-05 MED ORDER — PHENYLEPHRINE HCL-NACL 20-0.9 MG/250ML-% IV SOLN
INTRAVENOUS | Status: DC | PRN
  Administered 2021-07-05: 25 ug/min via INTRAVENOUS

## 2021-07-05 MED ORDER — SUGAMMADEX SODIUM 500 MG/5ML IV SOLN
INTRAVENOUS | Status: DC | PRN
  Administered 2021-07-05: 300 mg via INTRAVENOUS

## 2021-07-05 MED ORDER — AMISULPRIDE (ANTIEMETIC) 5 MG/2ML IV SOLN: 10.0000 mg | Freq: Once | INTRAVENOUS | Status: DC | PRN

## 2021-07-05 MED ORDER — BUPIVACAINE-EPINEPHRINE (PF) 0.25% -1:200000 IJ SOLN
INTRAMUSCULAR | Status: AC
  Filled 2021-07-05: qty 30

## 2021-07-05 MED ORDER — OXYCODONE HCL 5 MG PO TABS: 5.0000 mg | ORAL_TABLET | Freq: Four times a day (QID) | ORAL | 0 refills | Status: DC | PRN

## 2021-07-05 MED ORDER — IPRATROPIUM-ALBUTEROL 0.5-2.5 (3) MG/3ML IN SOLN
RESPIRATORY_TRACT | Status: AC
  Filled 2021-07-05: qty 3

## 2021-07-05 MED ORDER — OXYCODONE HCL 5 MG PO TABS
ORAL_TABLET | ORAL | Status: AC
  Filled 2021-07-05: qty 1

## 2021-07-05 MED ORDER — LIDOCAINE HCL (CARDIAC) PF 100 MG/5ML IV SOSY
PREFILLED_SYRINGE | INTRAVENOUS | Status: DC | PRN
Start: 2021-07-05 — End: 2021-07-05
  Administered 2021-07-05: 100 mg via INTRAVENOUS

## 2021-07-05 MED ORDER — ONDANSETRON HCL 4 MG/2ML IJ SOLN: 4.0000 mg | Freq: Once | INTRAMUSCULAR | Status: DC | PRN

## 2021-07-05 MED ORDER — ONDANSETRON HCL 4 MG/2ML IJ SOLN
INTRAMUSCULAR | Status: DC | PRN
  Administered 2021-07-05: 4 mg via INTRAVENOUS

## 2021-07-05 MED ORDER — SODIUM CHLORIDE 0.9 % IV SOLN
INTRAVENOUS | Status: AC
  Filled 2021-07-05: qty 10

## 2021-07-05 MED ORDER — DEXAMETHASONE SODIUM PHOSPHATE 4 MG/ML IJ SOLN
INTRAMUSCULAR | Status: DC | PRN
  Administered 2021-07-05: 5 mg via INTRAVENOUS

## 2021-07-05 MED ORDER — 0.9 % SODIUM CHLORIDE (POUR BTL) OPTIME
TOPICAL | Status: DC | PRN
  Administered 2021-07-05: 1000 mL

## 2021-07-05 MED ORDER — PROPOFOL 10 MG/ML IV BOLUS
INTRAVENOUS | Status: AC
Start: 2021-07-05 — End: ?
  Filled 2021-07-05: qty 20

## 2021-07-05 MED ORDER — FENTANYL CITRATE (PF) 100 MCG/2ML IJ SOLN
25.0000 ug | INTRAMUSCULAR | Status: DC | PRN
  Administered 2021-07-05 (×2): 25 ug via INTRAVENOUS

## 2021-07-05 MED ORDER — ALBUTEROL SULFATE HFA 108 (90 BASE) MCG/ACT IN AERS
INHALATION_SPRAY | RESPIRATORY_TRACT | Status: AC
  Filled 2021-07-05: qty 6.7

## 2021-07-05 MED ORDER — ACETAMINOPHEN 500 MG PO TABS
ORAL_TABLET | ORAL | Status: AC
  Filled 2021-07-05: qty 2

## 2021-07-05 MED ORDER — CEFAZOLIN SODIUM-DEXTROSE 2-4 GM/100ML-% IV SOLN
INTRAVENOUS | Status: AC
  Filled 2021-07-05: qty 100

## 2021-07-05 MED ORDER — FENTANYL CITRATE (PF) 100 MCG/2ML IJ SOLN
INTRAMUSCULAR | Status: AC
  Filled 2021-07-05: qty 2

## 2021-07-05 MED ORDER — BUPIVACAINE HCL (PF) 0.5 % IJ SOLN
INTRAMUSCULAR | Status: AC
Start: 2021-07-05 — End: ?
  Filled 2021-07-05: qty 30

## 2021-07-05 MED ORDER — OXYCODONE HCL 5 MG PO TABS
5.0000 mg | ORAL_TABLET | ORAL | Status: DC | PRN
  Administered 2021-07-05: 5 mg via ORAL

## 2021-07-05 MED ORDER — ONDANSETRON HCL 4 MG/2ML IJ SOLN
INTRAMUSCULAR | Status: AC
  Filled 2021-07-05: qty 2

## 2021-07-05 MED ORDER — DEXAMETHASONE SODIUM PHOSPHATE 10 MG/ML IJ SOLN
INTRAMUSCULAR | Status: AC
  Filled 2021-07-05: qty 1

## 2021-07-05 MED ORDER — PHENYLEPHRINE HCL (PRESSORS) 10 MG/ML IV SOLN
INTRAVENOUS | Status: DC | PRN
  Administered 2021-07-05: 160 ug via INTRAVENOUS
  Administered 2021-07-05: 240 ug via INTRAVENOUS
  Administered 2021-07-05 (×2): 80 ug via INTRAVENOUS
  Administered 2021-07-05: 240 ug via INTRAVENOUS
  Administered 2021-07-05: 160 ug via INTRAVENOUS
  Administered 2021-07-05 (×2): 80 ug via INTRAVENOUS
  Administered 2021-07-05: 160 ug via INTRAVENOUS

## 2021-07-05 MED ORDER — FENTANYL CITRATE (PF) 100 MCG/2ML IJ SOLN
INTRAMUSCULAR | Status: DC | PRN
  Administered 2021-07-05: 100 ug via INTRAVENOUS
  Administered 2021-07-05: 25 ug via INTRAVENOUS
  Administered 2021-07-05 (×2): 50 ug via INTRAVENOUS

## 2021-07-05 MED ORDER — GABAPENTIN 300 MG PO CAPS
ORAL_CAPSULE | ORAL | Status: AC
  Filled 2021-07-05: qty 1

## 2021-07-05 MED ORDER — CEFAZOLIN SODIUM-DEXTROSE 2-4 GM/100ML-% IV SOLN: 2.0000 g | INTRAVENOUS | Status: DC

## 2021-07-05 MED ORDER — BUPIVACAINE HCL (PF) 0.5 % IJ SOLN
INTRAMUSCULAR | Status: DC | PRN
  Administered 2021-07-05: 30 mL

## 2021-07-05 MED ORDER — ROCURONIUM BROMIDE 10 MG/ML (PF) SYRINGE
PREFILLED_SYRINGE | INTRAVENOUS | Status: AC
  Filled 2021-07-05: qty 10

## 2021-07-05 MED ORDER — LACTATED RINGERS IV SOLN: INTRAVENOUS | Status: DC

## 2021-07-05 MED ORDER — PROPOFOL 10 MG/ML IV BOLUS
INTRAVENOUS | Status: AC
  Filled 2021-07-05: qty 20

## 2021-07-05 MED ORDER — GABAPENTIN 300 MG PO CAPS
300.0000 mg | ORAL_CAPSULE | ORAL | Status: AC
  Administered 2021-07-05: 300 mg via ORAL

## 2021-07-05 SURGICAL SUPPLY — 75 items
ADH SKN CLS APL DERMABOND .7 (GAUZE/BANDAGES/DRESSINGS) ×8
APL PRP STRL LF DISP 70% ISPRP (MISCELLANEOUS) ×4
APPLIER CLIP 9.375 MED OPEN (MISCELLANEOUS) ×3
APR CLP MED 9.3 20 MLT OPN (MISCELLANEOUS) ×2
BAG DECANTER FOR FLEXI CONT (MISCELLANEOUS) ×2 IMPLANT
BINDER BREAST 3XL (GAUZE/BANDAGES/DRESSINGS) IMPLANT
BINDER BREAST LRG (GAUZE/BANDAGES/DRESSINGS) IMPLANT
BINDER BREAST MEDIUM (GAUZE/BANDAGES/DRESSINGS) IMPLANT
BINDER BREAST XLRG (GAUZE/BANDAGES/DRESSINGS) IMPLANT
BINDER BREAST XXLRG (GAUZE/BANDAGES/DRESSINGS) ×1 IMPLANT
BLADE SURG 10 STRL SS (BLADE) ×12 IMPLANT
BLADE SURG 15 STRL LF DISP TIS (BLADE) IMPLANT
BLADE SURG 15 STRL SS (BLADE) ×3
BNDG ELASTIC 6X5.8 VLCR STR LF (GAUZE/BANDAGES/DRESSINGS) IMPLANT
BNDG GAUZE ELAST 4 BULKY (GAUZE/BANDAGES/DRESSINGS) ×6 IMPLANT
CANISTER SUCT 1200ML W/VALVE (MISCELLANEOUS) ×3 IMPLANT
CHLORAPREP W/TINT 26 (MISCELLANEOUS) ×6 IMPLANT
CLIP APPLIE 9.375 MED OPEN (MISCELLANEOUS) IMPLANT
COVER BACK TABLE 60X90IN (DRAPES) ×3 IMPLANT
COVER MAYO STAND STRL (DRAPES) ×3 IMPLANT
DERMABOND ADVANCED (GAUZE/BANDAGES/DRESSINGS) ×4
DERMABOND ADVANCED .7 DNX12 (GAUZE/BANDAGES/DRESSINGS) ×2 IMPLANT
DRAIN CHANNEL 15F RND FF W/TCR (WOUND CARE) ×4 IMPLANT
DRAPE TOP ARMCOVERS (MISCELLANEOUS) ×3 IMPLANT
DRAPE U-SHAPE 76X120 STRL (DRAPES) ×4 IMPLANT
DRAPE UTILITY XL STRL (DRAPES) ×4 IMPLANT
DRSG PAD ABDOMINAL 8X10 ST (GAUZE/BANDAGES/DRESSINGS) ×8 IMPLANT
ELECT BLADE 4.0 EZ CLEAN MEGAD (MISCELLANEOUS)
ELECT COATED BLADE 2.86 ST (ELECTRODE) ×3 IMPLANT
ELECT REM PT RETURN 9FT ADLT (ELECTROSURGICAL) ×3
ELECTRODE BLDE 4.0 EZ CLN MEGD (MISCELLANEOUS) ×2 IMPLANT
ELECTRODE REM PT RTRN 9FT ADLT (ELECTROSURGICAL) ×2 IMPLANT
EVACUATOR SILICONE 100CC (DRAIN) ×3 IMPLANT
GLOVE BIO SURGEON STRL SZ 6 (GLOVE) ×5 IMPLANT
GLOVE BIO SURGEON STRL SZ 6.5 (GLOVE) ×1 IMPLANT
GLOVE BIOGEL PI IND STRL 7.0 (GLOVE) IMPLANT
GLOVE BIOGEL PI INDICATOR 7.0 (GLOVE) ×4
GOWN STRL REUS W/ TWL LRG LVL3 (GOWN DISPOSABLE) ×4 IMPLANT
GOWN STRL REUS W/TWL LRG LVL3 (GOWN DISPOSABLE) ×9
IV NS 500ML (IV SOLUTION)
IV NS 500ML BAXH (IV SOLUTION) ×2 IMPLANT
KIT FILL ASEPTIC TRANSFER (MISCELLANEOUS) IMPLANT
MARKER SKIN DUAL TIP RULER LAB (MISCELLANEOUS) IMPLANT
NDL HYPO 25X1 1.5 SAFETY (NEEDLE) IMPLANT
NEEDLE HYPO 25X1 1.5 SAFETY (NEEDLE) ×3 IMPLANT
NS IRRIG 1000ML POUR BTL (IV SOLUTION) ×3 IMPLANT
PACK BASIN DAY SURGERY FS (CUSTOM PROCEDURE TRAY) ×3 IMPLANT
PENCIL SMOKE EVACUATOR (MISCELLANEOUS) ×3 IMPLANT
PIN SAFETY STERILE (MISCELLANEOUS) ×3 IMPLANT
PUNCH BIOPSY DERMAL 4MM (MISCELLANEOUS) IMPLANT
SHEET MEDIUM DRAPE 40X70 STRL (DRAPES) ×6 IMPLANT
SLEEVE SCD COMPRESS KNEE MED (STOCKING) ×3 IMPLANT
SPIKE FLUID TRANSFER (MISCELLANEOUS) IMPLANT
SPONGE T-LAP 18X18 ~~LOC~~+RFID (SPONGE) ×12 IMPLANT
STAPLER VISISTAT 35W (STAPLE) ×4 IMPLANT
SUT ETHILON 2 0 FS 18 (SUTURE) ×3 IMPLANT
SUT MNCRL AB 4-0 PS2 18 (SUTURE) ×10 IMPLANT
SUT PDS 3-0 CT2 (SUTURE)
SUT PDS AB 2-0 CT2 27 (SUTURE) IMPLANT
SUT PDS II 3-0 CT2 27 ABS (SUTURE) IMPLANT
SUT PROLENE 2 0 CT2 30 (SUTURE) ×1 IMPLANT
SUT VIC AB 3-0 PS1 18 (SUTURE) ×30
SUT VIC AB 3-0 PS1 18XBRD (SUTURE) ×8 IMPLANT
SUT VIC AB 3-0 SH 27 (SUTURE)
SUT VIC AB 3-0 SH 27X BRD (SUTURE) IMPLANT
SUT VICRYL 4-0 PS2 18IN ABS (SUTURE) ×6 IMPLANT
SUT VLOC 90 3-0 CLR P12 (SUTURE) ×2 IMPLANT
SYR 50ML LL SCALE MARK (SYRINGE) IMPLANT
SYR BULB IRRIG 60ML STRL (SYRINGE) ×3 IMPLANT
SYR CONTROL 10ML LL (SYRINGE) ×1 IMPLANT
TAPE MEASURE VINYL STERILE (MISCELLANEOUS) IMPLANT
TOWEL GREEN STERILE FF (TOWEL DISPOSABLE) ×6 IMPLANT
TUBE CONNECTING 20X1/4 (TUBING) ×3 IMPLANT
UNDERPAD 30X36 HEAVY ABSORB (UNDERPADS AND DIAPERS) ×6 IMPLANT
YANKAUER SUCT BULB TIP NO VENT (SUCTIONS) ×3 IMPLANT

## 2021-07-05 NOTE — Op Note (Signed)
Operative Note  ? ?DATE OF OPERATION: 4.25.2023 ? ?LOCATION: [facility]-outpatient ? ?SURGICAL DIVISION: Plastic Surgery ? ?PREOPERATIVE DIAGNOSES:  1. Right breast DCIS s/p lumpectomy 2. Macromastia 3. Chronic neck and back pain ? ?POSTOPERATIVE DIAGNOSES:  same ? ?PROCEDURE:  Bilateral breast reduction ? ?SURGEON: Irene Limbo MD MBA ? ?ASSISTANT: none ? ?ANESTHESIA:  General.  ? ?EBL: 50 ml ? ?COMPLICATIONS: None immediate.  ? ?INDICATIONS FOR PROCEDURE:  ?The patient, Shelley Hayes, is a 76 y.o. female born on 1945-03-31, is here for staged breast reconstruction following recent lumpectomy for DCIS in setting of macromastia, chronic neck and back pain.  ?  ?FINDINGS: Right reduction total 1068 g Left reduction 1218 g ? ?DESCRIPTION OF PROCEDURE:  ?The patient was marked standing in the preoperative area to mark sternal notch, chest midline, anterior axillary lines, inframammary folds. The location of new nipple areolar complex was marked at level of on inframammary fold on anterior surface breast by palpation. This was marked symmetric over bilateral breasts. With aid of Wise pattern marker, location of new nipple areolar complex and vertical limbs (8 cm) were marked by displacement of breasts along meridian. The patient was taken to the operating room. SCDs were placed and IV antibiotics were given. The patient's operative site was prepped and draped in a sterile fashion. A time out was performed and all information was confirmed to be correct.   ?  ?Over left breast, superior medial pedicle marked and nipple areolar complex incised with 45 mm diameter marker. Pedicle deepithlialized and developed to chest wall. Breast tissue resected over lower pole. Medial and lateral flaps developed. Additional lateral and superior breast tissue excised. Breast tailor tacked closed.  ?  ?I then directed attention to right breast where superior medial pedicle designed. NAC marked with 45 mm diameter marker. The  pedicle was deepithelialized. Lumpectomy cavity entered and seroma drained. Pedicle developed to chest wall. Breast tissue resected over lower pole. This included resection portion anterior border  lumpectomy cavity and specimen marked.  Medial and lateral flaps developed. Additional lateral and superior pole breast tissue excised which represented lateral border lumpectomy cavity and specimen marked. Additional clips marking new lumpectomy cavity borders placed. Breast tailor tacked closed, and patient assessed for symmetry. Breast cavities irrigated and hemostasis obtained. Local anesthetic infiltrated throughout each breast. 15 Fr JP placed in each breast and secured with 2-0 nylon. Closure completed bilateral with 3-0 vicryl to approximate dermis along inframammary fold and vertical limb. NAC inset with 4-0 vicryl in dermis. Skin closure completed with 4-0 monocryl subcuticular throughout vertical limbs and NAC. 3-0 V lock subcuticular skin closure completed along inframammary folds. Tissue adhesive applied. Dry dressing and breast binder applied. ? ?The patient was allowed to wake from anesthesia, extubated and taken to the recovery room in satisfactory condition.  ? ?SPECIMENS: right breast reduction, stitch marks anterior lumpectomy cavity; right breast reduction, stitch marks lateral lumpectomy cavity; left breast reduction ? ?DRAINS: 15 Fr JP in right and left breast ? ?Irene Limbo, MD MBA ?Plastic & Reconstructive Surgery ? ?Office/ physician access line after hours [phone]   ?

## 2021-07-05 NOTE — Anesthesia Procedure Notes (Signed)
Procedure Name: Intubation ?Date/Time: 07/05/2021 7:51 AM ?Performed by: Verita Lamb, CRNA ?Pre-anesthesia Checklist: Patient identified, Emergency Drugs available, Suction available and Patient being monitored ?Patient Re-evaluated:Patient Re-evaluated prior to induction ?Oxygen Delivery Method: Circle system utilized ?Preoxygenation: Pre-oxygenation with 100% oxygen ?Induction Type: IV induction ?Ventilation: Mask ventilation without difficulty ?Laryngoscope Size: Mac and 4 ?Grade View: Grade I ?Tube type: Oral ?Tube size: 7.0 mm ?Number of attempts: 1 ?Airway Equipment and Method: Stylet and Oral airway ?Placement Confirmation: ETT inserted through vocal cords under direct vision, positive ETCO2, breath sounds checked- equal and bilateral and CO2 detector ?Secured at: 22 cm ?Tube secured with: Tape ?Dental Injury: Teeth and Oropharynx as per pre-operative assessment  ? ? ? ? ?

## 2021-07-05 NOTE — H&P (Signed)
Subjective:  ? ?Patient ID: Shelley Hayes is a 76 y.o. female. ? ?HPI ? ?Presents for oncoplastic breast reconstruction. Reports clear right nipple discharge for 5 years. Underwent screening MMG with right breast calcifications. Diagnostic MMG showed 5 mm calcs involving the outer RIGHT breast at middle to posterior depth. Biopsy labeled right breast outer showed intermediate grade DCIS, ER/PR+. Additional biopsy labeled right breast retroareolar showed fibrocystic change. Met with Radiation Oncology at [facility]. Per patient discussed RT vs hormonal treatment. Patient states she is unlikely to pursue radiation post operatively. ? ?She is now 1 week post lumpectomy with final pathology 4.5 cm DCIS with calcs, margins negative, DCIS focally 0.1 cm from posterior and medial margins, ductal papilloma with calcifications. ? ?Current 38 DDD. Reports several year history neck and back and shoulder pain. Has tried specialty fitted bras, OTC pain medication, hot/cold packs for over year trial without relied. Notes she feels relief back pain if she is able to hold breasts off chest. Denies rashes. Reports bilateral intermittent hand numbness. ? ?Wt stable. ? ?Retired [employer] in [place]. Returned to [place] which is where she grew up after she retired. Lives alone. Daughter (who does not drive) in area to assist with post op care. ? ?Review of Systems  ?HENT: Positive for sinus pressure.  ?Musculoskeletal: Positive for back pain and neck pain.  ? ?Remainder 12 point review negative ? ?Objective:  ?Physical Exam ?Cardiovascular:  ?Rate and Rhythm: Normal rate. Normal heart sounds ?Pulmonary:  ?Effort: Pulmonary effort is normal. Clear to auscultation ?Lymphadenopathy:  ?Upper Body:  ?Right upper body: No axillary adenopathy.  ?Left upper body: No axillary adenopathy.  ?Skin: ?Comments: Fitzpatrick 6  ? ?+shoulder grooving ?Breasts: grade 3 ptosis bilateral no palpable masses ?SN to nipple R 41 L 42 cm ?BW  R 27 L 28 cm ?Nipple to IMF R 15 L 17 cm ? ?Assessment:  ? ?DCIS right breast s/p lumpectomy ?Macromastia ? ?Plan:  ? ?Plan oncoplastic reconstruction 7-10 d post lumpectomy to ensure pathologic clearance. Reviewed reduction with anchor type scars, drains, post operative visits and limitations, recovery. Diminished sensation nipple and breast skin, risk of nipple loss, wound healing problems, asymmetry. Discussed will have some contraction of breast volume and increased firmness with radiation, less ptosis with aging. This can result in asymmetries long term. Discussed changes with wt gain, loss, aging. Discussed lumpectomy alone can result in NAC displacement, distortion contour breast following lumpectomy and RT, asymmetry breast volume and NAC position. Reviewed purpose of this type reconstruction to prevent these. Reviewed breast lift or trying to correct NAC displacement post RT more difficult, higher risk complications. Counseled I cannot assure her cup size.  Reviewed can defer surgery until after therapies complete. In this setting would wait at least 6 months from end RT for any surgery. Reviewed increased risks complications in setting RT. Reviewed any complications from oncoplastic reconstruction procedure may delay start RT.  ?  ?Discussed given breast size, SN to nipple distance, she is in high risk category for NAC compromise or necrosis. Reviewed free nipple graft as possibility- in this setting NAC would have no sensation not stimulate and color changes. This type of complication could delay adjuvant treatments, may require additional procedures.  ? ?

## 2021-07-05 NOTE — Discharge Instructions (Addendum)
May have Tylenol after 12:45pm today if needed ? ?Post Anesthesia Home Care Instructions ? ?Activity: ?Get plenty of rest for the remainder of the day. A responsible individual must stay with you for 24 hours following the procedure.  ?For the next 24 hours, DO NOT: ?-Drive a car ?-Paediatric nurse ?-Drink alcoholic beverages ?-Take any medication unless instructed by your physician ?-Make any legal decisions or sign important papers. ? ?Meals: ?Start with liquid foods such as gelatin or soup. Progress to regular foods as tolerated. Avoid greasy, spicy, heavy foods. If nausea and/or vomiting occur, drink only clear liquids until the nausea and/or vomiting subsides. Call your physician if vomiting continues. ? ?Special Instructions/Symptoms: ?Your throat may feel dry or sore from the anesthesia or the breathing tube placed in your throat during surgery. If this causes discomfort, gargle with warm salt water. The discomfort should disappear within 24 hours. ? ?If you had a scopolamine patch placed behind your ear for the management of post- operative nausea and/or vomiting: ? ?1. The medication in the patch is effective for 72 hours, after which it should be removed.  Wrap patch in a tissue and discard in the trash. Wash hands thoroughly with soap and water. ?2. You may remove the patch earlier than 72 hours if you experience unpleasant side effects which may include dry mouth, dizziness or visual disturbances. ?3. Avoid touching the patch. Wash your hands with soap and water after contact with the patch. ?    ? ? ? ?JP Drain Totals ?Bring this sheet to all of your post-operative appointments while you have your drains. ?Please measure your drains by CC's or ML's. ?Make sure you drain and measure your JP Drains 2 or 3 times per day. ?At the end of each day, add up totals for the left side and add up totals for the right side. ?   ( 9 am )     ( 3 pm )        ( 9 pm )                ?Date L  R  L  R  L  R  Total L/R   ?               ?               ?               ?               ?               ?               ?               ?               ?               ?               ?               ?               ?  ?

## 2021-07-05 NOTE — Anesthesia Postprocedure Evaluation (Signed)
Anesthesia Post Note ? ?Patient: Shelley Hayes ? ?Procedure(s) Performed: BREAST RECONSTRUCTION (Right: Breast) ?MAMMARY REDUCTION  (BREAST) (Left: Breast) ? ?  ? ?Patient location during evaluation: PACU ?Anesthesia Type: General ?Level of consciousness: awake ?Pain management: pain level controlled ?Vital Signs Assessment: post-procedure vital signs reviewed and stable ?Respiratory status: spontaneous breathing, nonlabored ventilation, respiratory function stable and patient connected to nasal cannula oxygen ?Cardiovascular status: blood pressure returned to baseline and stable ?Postop Assessment: no apparent nausea or vomiting ?Anesthetic complications: no ? ? ?No notable events documented. ? ?Last Vitals:  ?Vitals:  ? 07/05/21 1256 07/05/21 1320  ?BP:  (!) 108/54  ?Pulse:  95  ?Resp:  16  ?Temp:  36.6 ?C  ?SpO2: 100% 95%  ?  ?Last Pain:  ?Vitals:  ? 07/05/21 1320  ?TempSrc:   ?PainSc: 8   ? ? ?  ?  ?  ?  ?  ?  ? ?[place] P Nina Hoar ? ? ? ? ?

## 2021-07-05 NOTE — Transfer of Care (Signed)
Immediate Anesthesia Transfer of Care Note ? ?Patient: Shelley Hayes ? ?Procedure(s) Performed: BREAST RECONSTRUCTION (Right: Breast) ?MAMMARY REDUCTION  (BREAST) (Left: Breast) ? ?Patient Location: PACU ? ?Anesthesia Type:General ? ?Level of Consciousness: awake, alert  and oriented ? ?Airway & Oxygen Therapy: Patient Spontanous Breathing and non-rebreather face mask ? ?Post-op Assessment: Report given to RN and Post -op Vital signs reviewed and stable ? ?Post vital signs: Reviewed and stable ? ?Last Vitals:  ?Vitals Value Taken Time  ?BP 125/69 07/05/21 1154  ?Temp    ?Pulse 98 07/05/21 1157  ?Resp 23 07/05/21 1157  ?SpO2 92 % 07/05/21 1157  ?Vitals shown include unvalidated device data. ? ?Last Pain:  ?Vitals:  ? 07/05/21 0638  ?TempSrc: Oral  ?PainSc: 1   ?   ? ?Patients Stated Pain Goal: 1 (07/05/21 9191) ? ?Complications: No notable events documented. ?

## 2021-07-06 ENCOUNTER — Encounter (HOSPITAL_BASED_OUTPATIENT_CLINIC_OR_DEPARTMENT_OTHER): Payer: Self-pay | Admitting: Plastic Surgery

## 2021-07-06 LAB — SURGICAL PATHOLOGY

## 2021-07-06 NOTE — Progress Notes (Signed)
Left message stating courtesy call and if any questions or concerns please call the doctors office.  

## 2021-07-27 ENCOUNTER — Other Ambulatory Visit: Payer: Self-pay | Admitting: Internal Medicine

## 2021-07-29 ENCOUNTER — Other Ambulatory Visit: Payer: Self-pay | Admitting: Internal Medicine

## 2021-10-08 ENCOUNTER — Other Ambulatory Visit: Payer: Self-pay | Admitting: Internal Medicine

## 2021-12-25 ENCOUNTER — Other Ambulatory Visit: Payer: Self-pay | Admitting: Internal Medicine

## 2021-12-27 ENCOUNTER — Encounter: Payer: Self-pay | Admitting: Internal Medicine

## 2021-12-27 NOTE — Progress Notes (Unsigned)
Subjective:    Patient ID: Shelley Hayes, female    DOB: 12/30/1945, 76 y.o.   MRN: [id number]     HPI [name] is here for follow up of her chronic medical problems, including htn, hld, prediabetes, h/o cva on imaging, morbid obesity, anxiety  Due for colnoscopy  Medications and allergies reviewed with patient and updated if appropriate.  Current Outpatient Medications on File Prior to Visit  Medication Sig Dispense Refill   albuterol (VENTOLIN HFA) 108 (90 Base) MCG/ACT inhaler Inhale 2 puffs into the lungs every 6 (six) hours as needed for wheezing or shortness of breath. 18 g 5   Alcohol Swabs (B-D SINGLE USE SWABS REGULAR) PADS USE AS DIRECTED 100 each 1   ALPRAZolam (XANAX) 0.5 MG tablet Take 1 tablet (0.5 mg total) by mouth 2 (two) times daily as needed for anxiety or sleep. 30 tablet 0   amLODipine (NORVASC) 10 MG tablet TAKE 1 TABLET EVERY DAY 90 tablet 1   Ascorbic Acid (VITAMIN C) 1000 MG tablet Take 2,000 mg by mouth daily.     aspirin 81 MG tablet Take 81 mg by mouth daily.     atorvastatin (LIPITOR) 40 MG tablet TAKE 1 TABLET EVERY DAY 90 tablet 1   blood glucose meter kit and supplies KIT Dispense based on patient and insurance preference. Use up to four times daily as directed. R73.03. 1 each 0   Cholecalciferol (VITAMIN D3) 1000 UNITS CAPS Take 1 capsule (1,000 Units total) by mouth daily. 30 capsule    hydrochlorothiazide (MICROZIDE) 12.5 MG capsule TAKE 1 CAPSULE EVERY DAY 90 capsule 10   irbesartan-hydrochlorothiazide (AVALIDE) 150-12.5 MG tablet TAKE 1 TABLET EVERY DAY 90 tablet 1   latanoprost (XALATAN) 0.005 % ophthalmic solution Place 1 drop into both eyes at bedtime. 7.5 mL 0   Multiple Vitamin (MULTIVITAMIN) tablet Take 1 tablet by mouth daily.     oxyCODONE (OXY IR/ROXICODONE) 5 MG immediate release tablet Take 1 tablet (5 mg total) by mouth every 6 (six) hours as needed for severe pain. 15 tablet 0   potassium chloride SA (KLOR-CON M) 20 MEQ tablet TAKE 1  TABLET (20 MEQ TOTAL) BY MOUTH DAILY. 90 tablet 2   vitamin B-12 (CYANOCOBALAMIN) 1000 MCG tablet Take 1 tablet (1,000 mcg total) by mouth daily.     No current facility-administered medications on file prior to visit.     Review of Systems     Objective:  There were no vitals filed for this visit. BP Readings from Last 3 Encounters:  07/05/21 (!) 108/54  06/28/21 125/70  06/22/21 136/78   Wt Readings from Last 3 Encounters:  07/05/21 189 lb 6 oz (85.9 kg)  06/28/21 187 lb 2.7 oz (84.9 kg)  06/22/21 192 lb 12.8 oz (87.5 kg)   There is no height or weight on file to calculate BMI.    Physical Exam     Lab Results  Component Value Date   WBC 6.4 12/22/2020   HGB 13.7 12/22/2020   HCT 42.8 12/22/2020   PLT 316.0 12/22/2020   GLUCOSE 92 06/27/2021   CHOL 213 (H) 06/22/2021   TRIG 239.0 (H) 06/22/2021   HDL 40.80 06/22/2021   LDLDIRECT 142.0 06/22/2021   LDLCALC 188 (H) 12/22/2020   ALT 16 06/22/2021   AST 24 06/22/2021   NA 142 06/27/2021   K 4.1 06/27/2021   CL 110 06/27/2021   CREATININE 0.88 06/27/2021   BUN 13 06/27/2021   CO2 25  06/27/2021   TSH 0.76 12/22/2020   HGBA1C 5.7 06/22/2021     Assessment & Plan:    See Problem List for Assessment and Plan of chronic medical problems.

## 2021-12-27 NOTE — Patient Instructions (Addendum)
     Call GI to schedule your colonoscopy Phone: [phone]    Flu immunization administered today.    You are eligible for the following vaccines at the pharmacy - shingles, covid, RSV, tdap   Blood work was ordered.   The lab is on the first floor.    Medications changes include :   none   Referral ordered for [facility] Uro-gynecology.     Return in about 6 months (around 06/29/2022) for Physical Exam.

## 2021-12-28 ENCOUNTER — Ambulatory Visit (INDEPENDENT_AMBULATORY_CARE_PROVIDER_SITE_OTHER): Payer: Medicare PPO | Admitting: Internal Medicine

## 2021-12-28 VITALS — BP 132/82 | HR 70 | Temp 98.5°F | Ht <= 58 in | Wt 191.0 lb

## 2021-12-28 DIAGNOSIS — F419 Anxiety disorder, unspecified: Secondary | ICD-10-CM | POA: Diagnosis not present

## 2021-12-28 DIAGNOSIS — R3915 Urgency of urination: Secondary | ICD-10-CM | POA: Diagnosis not present

## 2021-12-28 DIAGNOSIS — E782 Mixed hyperlipidemia: Secondary | ICD-10-CM

## 2021-12-28 DIAGNOSIS — R7303 Prediabetes: Secondary | ICD-10-CM

## 2021-12-28 DIAGNOSIS — Z23 Encounter for immunization: Secondary | ICD-10-CM | POA: Diagnosis not present

## 2021-12-28 DIAGNOSIS — R350 Frequency of micturition: Secondary | ICD-10-CM | POA: Diagnosis not present

## 2021-12-28 DIAGNOSIS — I10 Essential (primary) hypertension: Secondary | ICD-10-CM

## 2021-12-28 DIAGNOSIS — Z8673 Personal history of transient ischemic attack (TIA), and cerebral infarction without residual deficits: Secondary | ICD-10-CM | POA: Diagnosis not present

## 2021-12-28 LAB — URINALYSIS, ROUTINE W REFLEX MICROSCOPIC
Bilirubin Urine: NEGATIVE
Hgb urine dipstick: NEGATIVE
Ketones, ur: NEGATIVE
Nitrite: NEGATIVE
RBC / HPF: NONE SEEN (ref 0–?)
Specific Gravity, Urine: 1.01 (ref 1.000–1.030)
Total Protein, Urine: NEGATIVE
Urine Glucose: NEGATIVE
Urobilinogen, UA: 0.2 (ref 0.0–1.0)
pH: 6 (ref 5.0–8.0)

## 2021-12-28 LAB — LIPID PANEL
Cholesterol: 246 mg/dL — ABNORMAL HIGH (ref 0–200)
HDL: 45.2 mg/dL (ref >39.00)
LDL Cholesterol: 178 mg/dL — ABNORMAL HIGH (ref 0–99)
NonHDL: 200.74
Total CHOL/HDL Ratio: 5
Triglycerides: 116 mg/dL (ref 0.0–149.0)
VLDL: 23.2 mg/dL (ref 0.0–40.0)

## 2021-12-28 LAB — COMPREHENSIVE METABOLIC PANEL
ALT: 10 U/L (ref 0–35)
AST: 20 U/L (ref 0–37)
Albumin: 4.2 g/dL (ref 3.5–5.2)
Alkaline Phosphatase: 77 U/L (ref 39–117)
BUN: 14 mg/dL (ref 6–23)
CO2: 29 mEq/L (ref 19–32)
Calcium: 9.5 mg/dL (ref 8.4–10.5)
Chloride: 105 mEq/L (ref 96–112)
Creatinine, Ser: 0.88 mg/dL (ref 0.40–1.20)
GFR: 63.89 mL/min (ref >60.00)
Glucose, Bld: 91 mg/dL (ref 70–99)
Potassium: 3.8 mEq/L (ref 3.5–5.1)
Sodium: 140 mEq/L (ref 135–145)
Total Bilirubin: 0.5 mg/dL (ref 0.2–1.2)
Total Protein: 7.8 g/dL (ref 6.0–8.3)

## 2021-12-28 LAB — CBC WITH DIFFERENTIAL/PLATELET
Basophils Absolute: 0 10*3/uL (ref 0.0–0.1)
Basophils Relative: 0.6 % (ref 0.0–3.0)
Eosinophils Absolute: 0.2 10*3/uL (ref 0.0–0.7)
Eosinophils Relative: 2.4 % (ref 0.0–5.0)
HCT: 40.3 % (ref 36.0–46.0)
Hemoglobin: 12.9 g/dL (ref 12.0–15.0)
Lymphocytes Relative: 26.2 % (ref 12.0–46.0)
Lymphs Abs: 1.9 10*3/uL (ref 0.7–4.0)
MCHC: 32 g/dL (ref 30.0–36.0)
MCV: 86.1 fl (ref 78.0–100.0)
Monocytes Absolute: 0.6 10*3/uL (ref 0.1–1.0)
Monocytes Relative: 8.6 % (ref 3.0–12.0)
Neutro Abs: 4.6 10*3/uL (ref 1.4–7.7)
Neutrophils Relative %: 62.2 % (ref 43.0–77.0)
Platelets: 327 10*3/uL (ref 150.0–400.0)
RBC: 4.68 Mil/uL (ref 3.87–5.11)
RDW: 14.5 % (ref 11.5–15.5)
WBC: 7.4 10*3/uL (ref 4.0–10.5)

## 2021-12-28 LAB — HEMOGLOBIN A1C: Hgb A1c MFr Bld: 5.6 % (ref 4.6–6.5)

## 2021-12-28 NOTE — Assessment & Plan Note (Signed)
Chronic History of CVA Continue aspirin 81 mg daily, atorvastatin 40 mg daily Blood pressure well controlled Encouraged healthy diet, regular exercise

## 2021-12-28 NOTE — Assessment & Plan Note (Signed)
New Experiencing increased urinary frequency and urgency No symptoms consistent with an infection, but will check UA, urine culture to rule out 1 Will refer to urogynecology for further evaluation and treatment

## 2021-12-28 NOTE — Assessment & Plan Note (Signed)
Chronic Encouraged weight loss Advised regular exercise-5 days a week for at least 30 minutes Discussed decreased portions, diet low in sugars and carbs

## 2021-12-28 NOTE — Assessment & Plan Note (Addendum)
Chronic Regular exercise and healthy diet encouraged Check lipid panel Currently taking a herbal medication that helped for friends cholesterol and she would prefer to take that over the statin Currently not taking atorvastatin 40 mg daily If the LDL is elevated she agrees to restart the statin

## 2021-12-28 NOTE — Assessment & Plan Note (Addendum)
Chronic Blood pressure controlled CMP Continue amlodipine 10 mg daily, HCTZ 12.5 mg daily, Avalide 150-12.5 mg daily

## 2021-12-28 NOTE — Assessment & Plan Note (Signed)
Chronic Controlled Continue alprazolam 0.5 mg twice daily as needed

## 2021-12-28 NOTE — Assessment & Plan Note (Signed)
Chronic Check a1c Low sugar / carb diet Stressed regular exercise  

## 2021-12-29 LAB — URINE CULTURE

## 2022-03-02 DIAGNOSIS — J209 Acute bronchitis, unspecified: Secondary | ICD-10-CM | POA: Diagnosis not present

## 2022-03-02 DIAGNOSIS — R093 Abnormal sputum: Secondary | ICD-10-CM | POA: Diagnosis not present

## 2022-03-05 ENCOUNTER — Other Ambulatory Visit: Payer: Self-pay | Admitting: Internal Medicine

## 2022-04-03 ENCOUNTER — Ambulatory Visit: Payer: Medicare PPO

## 2022-04-03 ENCOUNTER — Ambulatory Visit (INDEPENDENT_AMBULATORY_CARE_PROVIDER_SITE_OTHER): Payer: Medicare PPO

## 2022-04-03 DIAGNOSIS — Z0000 Encounter for general adult medical examination without abnormal findings: Secondary | ICD-10-CM

## 2022-04-03 NOTE — Patient Instructions (Signed)
It was great speaking with you today!  Please schedule your next Medicare Wellness Visit with your Nurse Health Advisor in 1 year by calling [phone]. 

## 2022-04-03 NOTE — Progress Notes (Signed)
Subjective:   Shelley Hayes is a 77 y.o. female who presents for Medicare Annual (Subsequent) preventive examination.  I connected with Evamae today by telephone and verified that I am speaking with the correct person using two identifiers. I discussed the limitations, risks, security and privacy concerns of performing an evaluation and management service by telephone and the availability of in person appointments. I also discussed with the patient that there may be a patient responsible charge related to this service. The patient expressed understanding and agreed to proceed. Location patient: home Location provider: Pietro Cassis Persons participating in the visit: [place] and Jillene Bucks, [place].  Time Spent with patient on telephone encounter: 10 mins  Review of Systems    No ROS. Medicare Wellness Telephone Visit. Additional risk factors are reflected in social history. Cardiac Risk Factors include: advanced age (>16mn, >>13women);dyslipidemia;hypertension;obesity (BMI >30kg/m2)     Objective:    There were no vitals filed for this visit. There is no height or weight on file to calculate BMI.     04/03/2022    9:25 AM 07/05/2021    6:35 AM 06/29/2021    3:01 PM 06/28/2021    9:37 AM 06/16/2021   11:01 AM 04/01/2021    8:46 AM 03/15/2020    1:04 PM  Advanced Directives  Does Patient Have a Medical Advance Directive? Yes Yes Yes No Yes Yes Yes  Type of Advance Directive Living will [place] will [place] will  Healthcare Power of Attorney Living will;Healthcare Power of [place] will  Does patient want to make changes to medical advance directive? No - Patient declined No - Patient declined    No - Patient declined No - Patient declined  Copy of [place] Chart?  No - copy requested   No - copy requested No - copy requested No - copy requested  Would patient like  information on creating a medical advance directive?  No - Patient declined  No - Patient declined       Current Medications (verified) Outpatient Encounter Medications as of 04/03/2022  Medication Sig   albuterol (VENTOLIN HFA) 108 (90 Base) MCG/ACT inhaler Inhale 2 puffs into the lungs every 6 (six) hours as needed for wheezing or shortness of breath.   Alcohol Swabs (B-D SINGLE USE SWABS REGULAR) PADS USE AS DIRECTED   ALPRAZolam (XANAX) 0.5 MG tablet Take 1 tablet (0.5 mg total) by mouth 2 (two) times daily as needed for anxiety or sleep.   amLODipine (NORVASC) 10 MG tablet TAKE 1 TABLET EVERY DAY   Ascorbic Acid (VITAMIN C) 1000 MG tablet Take 2,000 mg by mouth daily.   aspirin 81 MG tablet Take 81 mg by mouth daily.   atorvastatin (LIPITOR) 40 MG tablet TAKE 1 TABLET EVERY DAY   Cholecalciferol (VITAMIN D3) 1000 UNITS CAPS Take 1 capsule (1,000 Units total) by mouth daily.   hydrochlorothiazide (MICROZIDE) 12.5 MG capsule TAKE 1 CAPSULE EVERY DAY   irbesartan-hydrochlorothiazide (AVALIDE) 150-12.5 MG tablet TAKE 1 TABLET EVERY DAY   latanoprost (XALATAN) 0.005 % ophthalmic solution Place 1 drop into both eyes at bedtime.   Multiple Vitamin (MULTIVITAMIN) tablet Take 1 tablet by mouth daily.   potassium chloride SA (KLOR-CON M) 20 MEQ tablet TAKE 1 TABLET (20 MEQ TOTAL) BY MOUTH DAILY.   vitamin B-12 (CYANOCOBALAMIN) 1000 MCG tablet Take 1 tablet (1,000 mcg total) by mouth daily.   No facility-administered encounter medications  on file as of 04/03/2022.    Allergies (verified) Hydrocodone bit-homatrop mbr, Losartan, Meloxicam, and Spironolactone   History: Past Medical History:  Diagnosis Date   ALLERGIC RHINITIS    Allergic rhinitis 08/23/2009   Allergy    Anxiety    ARTHRITIS    Arthritis    Blood in stool 04/24/2017   Cancer ([place]) 05/2021   right breast DCIS   Cataract    bil cataracts removes   Cervical paraspinal muscle spasm 12/23/2015   Chest pain 04/24/2017    CHEST PAIN 12/19/2009   Qualifier: Diagnosis of  By: Marca Ancona RMA, Lucy     COLONIC POLYPS, HX OF 08/23/2009   Qualifier: Diagnosis of  By: Marca Ancona RMA, Lucy     Cough 06/19/2016   Fluttering heart 12/23/2015   GERD (gastroesophageal reflux disease)    Glaucoma    GLUCOMA    Heart murmur    Herpes zoster without complication 24/11/7351   Hyperglycemia 06/19/2016   HYPERLIPIDEMIA    HYPERTENSION    KNEE PAIN 08/23/2009   Qualifier: Diagnosis of  By: Asa Lente MD, Valerie A    Muscle cramps 06/19/2016   Osteopenia 04/26/2016   2/18 - dexa - RFN and LFN  -1.2   OTITIS EXTERNA, RIGHT 01/20/2010   Qualifier: Diagnosis of  By: Asa Lente MD, Valerie A    Pre-diabetes    Prediabetes 04/24/2017   SCIATICA, LEFT 11/2010   Past Surgical History:  Procedure Laterality Date   BREAST BIOPSY Right    benign   BREAST LUMPECTOMY WITH RADIOACTIVE SEED LOCALIZATION Right 06/28/2021   Procedure: RIGHT BREAST LUMPECTOMY WITH RADIOACTIVE SEED LOCALIZATION;  Surgeon: Erroll Luna, MD;  Location: [place];  Service: General;  Laterality: Right;   BREAST RECONSTRUCTION Right 07/05/2021   Procedure: BREAST RECONSTRUCTION;  Surgeon: Irene Limbo, MD;  Location: [place];  Service: Plastics;  Laterality: Right;   BREAST REDUCTION SURGERY Left 07/05/2021   Procedure: MAMMARY REDUCTION  (BREAST);  Surgeon: Irene Limbo, MD;  Location: [place];  Service: Plastics;  Laterality: Left;   COLONOSCOPY     ESOPHAGOGASTRODUODENOSCOPY     POLYPECTOMY     TUBAL LIGATION     UPPER GASTROINTESTINAL ENDOSCOPY     Family History  Problem Relation Age of Onset   Arthritis Mother    Diabetes Mother    Colon cancer Mother 28   Colon polyps Mother    Arthritis Father    Alcohol abuse Brother    Lung cancer Brother    Prostate cancer Brother    Diabetes Brother    Hypertension Brother    Lung cancer Other        [place]   Esophageal cancer Neg Hx    Liver  cancer Neg Hx    Pancreatic cancer Neg Hx    Rectal cancer Neg Hx    Stomach cancer Neg Hx    Social History   Socioeconomic History   Marital status: Divorced    Spouse name: Not on file   Number of children: 2   Years of education: Not on file   Highest education level: Not on file  Occupational History   Occupation: Retired  Tobacco Use   Smoking status: Never   Smokeless tobacco: Never  Vaping Use   Vaping Use: Never used  Substance and Sexual Activity   Alcohol use: No    Alcohol/week: 0.0 standard drinks of alcohol   Drug use: No   Sexual activity: Not Currently  Other Topics Concern   Not on file  Social History Narrative   Divorced, lives alone- [place]      Walking regularly, every morning   Social Determinants of Health   Financial Resource Strain: Low Risk  (04/03/2022)   Overall Financial Resource Strain (CARDIA)    Difficulty of Paying Living Expenses: Not hard at all  Food Insecurity: No Food Insecurity (04/03/2022)   Hunger Vital Sign    Worried About Running Out of Food in the Last Year: Never true    Ran Out of Food in the Last Year: Never true  Transportation Needs: No Transportation Needs (04/03/2022)   PRAPARE - THydrologist(Medical): No    Lack of Transportation (Non-Medical): No  Physical Activity: Sufficiently Active (04/03/2022)   Exercise Vital Sign    Days of Exercise per Week: 3 days    Minutes of Exercise per Session: 60 min  Stress: No Stress Concern Present (04/03/2022)   [place]   Feeling of Stress : Not at all  Social Connections: Unknown (04/03/2022)   Social Connection and Isolation Panel [NHANES]    Frequency of Communication with Friends and Family: More than three times a week    Frequency of Social Gatherings with Friends and Family: More than three times a week    Attends Religious  Services: More than 4 times per year    Active Member of [employer] Organizations: Yes    Attends CMusic therapist More than 4 times per year    Marital Status: Not on file    Tobacco Counseling Counseling given: Not Answered   Clinical Intake:  Pre-visit preparation completed: Yes  Pain : No/denies pain     BMI - recorded: 41 Nutritional Status: BMI > 30  Obese Nutritional Risks: None Diabetes: No (prediabetes)  How often do you need to have someone help you when you read instructions, pamphlets, or other written materials from your doctor or pharmacy?: 1 - Never What is the last grade level you completed in school?: 12th grade and some college   Interpreter Needed?: No  Information entered by :: [place]ene [place]  Activities of Daily Living    04/03/2022    9:27 AM 07/05/2021    6:40 AM  In your present state of health, do you have any difficulty performing the following activities:  Hearing? 0 0  Vision? 0 0  Difficulty concentrating or making decisions? 0 0  Walking or climbing stairs? 0 0  Dressing or bathing? 0 0  Doing errands, shopping? 0   Preparing Food and eating ? N   Using the Toilet? N   In the past six months, have you accidently leaked urine? N   Do you have problems with loss of bowel control? N   Managing your Medications? N   Managing your Finances? N   Housekeeping or managing your Housekeeping? N     Patient Care Team: BBinnie Rail MD as PCP - General (Internal Medicine) NJosue Hector MD as PCP - Cardiology (Cardiology) TEverlene Farrier MD (Obstetrics and Gynecology) CVictory Dakin MD as Referring Physician (Optometry) Szabat, DDarnelle Maffucci [facility](Inactive) (Pharmacist)  Indicate any recent Medical Services you may have received from other than [facility] providers in the past year (date may be approximate).     Assessment:   This is a routine wellness  examination for [facility].  Hearing/Vision screen Patient denied any hearing  difficulty. No hearing aids. Patient does wear corrective lenses.  Dietary issues and exercise activities discussed: Current Exercise Habits: Home exercise routine, Type of exercise: walking, Time (Minutes): 60, Frequency (Times/Week): 3, Weekly Exercise (Minutes/Week): 180, Intensity: Mild, Exercise limited by: None identified   Goals Addressed             This Visit's Progress    Patient Stated       I would like to lose weight and get down to 150 lbs.        Depression Screen    04/03/2022    9:27 AM 04/03/2022    9:24 AM 06/22/2021    9:43 AM 04/01/2021    9:03 AM 03/15/2020    1:00 PM 12/22/2019    9:30 AM 12/22/2019    8:58 AM  PHQ 2/9 Scores  PHQ - 2 Score 0 0 6 0 0 0 0  PHQ- 9 Score   9        Fall Risk    04/03/2022    9:26 AM 06/22/2021    9:43 AM 04/01/2021    8:47 AM 03/15/2020    1:04 PM 12/22/2019    8:53 AM  Fall Risk   Falls in the past year? '1 1 1 '$ 0 0  Number falls in past yr: '1 1 1 '$ 0 0  Injury with Fall? 0 0 0 0 0  Risk for fall due to : History of fall(s) No Fall Risks Orthopedic patient No Fall Risks No Fall Risks  Follow up Falls evaluation completed Falls evaluation completed Falls evaluation completed Falls evaluation completed Falls evaluation completed    [place]:  Any stairs in or around the home? No  If so, are there any without handrails?  N/A Home free of loose throw rugs in walkways, pet beds, electrical cords, etc? Yes  Adequate lighting in your home to reduce risk of falls? Yes   ASSISTIVE DEVICES UTILIZED TO PREVENT FALLS:  Life alert? No  Use of a cane, walker or w/c? Yes cane Grab bars in the bathroom? No  Shower chair or bench in shower? Yes  Elevated toilet seat or a handicapped toilet? Yes   TIMED UP AND GO:  Was the test performed? No .  Length of time to ambulate 10 feet: N/A sec.   Patient stated that she has no issues with gait or balance; uses a cane.  Cognitive Function:   Patient is cogitatively intact.      04/03/2022    9:30 AM  6CIT Screen  What Year? 0 points  What month? 0 points  What time? 0 points  Count back from 20 0 points  Months in reverse 0 points  Repeat phrase 4 points  Total Score 4 points    Immunizations Immunization History  Administered Date(s) Administered   Fluad Quad(high Dose 65+) 12/16/2018, 12/23/2019, 12/22/2020, 12/28/2021   Influenza Whole 01/20/2010   Influenza, High Dose Seasonal PF 12/30/2012, 02/26/2015, 02/29/2016, 02/06/2017, 03/27/2018   Influenza,inj,Quad PF,6+ Mos 02/23/2014   Janssen (J&J) SARS-COV-2 Vaccination 06/13/2019   Pneumococcal Conjugate-13 02/29/2016   Pneumococcal Polysaccharide-23 02/23/2014   Tetanus 10/25/2011    TDAP status: Due, Education has been provided regarding the importance of this vaccine. Advised may receive this vaccine at local pharmacy or Health Dept. Aware to provide a copy of the vaccination record if obtained from local pharmacy or Health Dept. Verbalized acceptance and  understanding.  Flu Vaccine status: Up to date  Pneumococcal vaccine status: Up to date  Covid-19 vaccine status: Completed vaccines  Qualifies for Shingles Vaccine? Yes   Zostavax completed No   Shingrix Completed?: No.    Education has been provided regarding the importance of this vaccine. Patient has been advised to call insurance company to determine out of pocket expense if they have not yet received this vaccine. Advised may also receive vaccine at local pharmacy or Health Dept. Verbalized acceptance and understanding.  Screening Tests Health Maintenance  Topic Date Due   DTaP/Tdap/Td (1 - Tdap) 10/26/2011   COVID-19 Vaccine (2 - Janssen risk series) 04/19/2022 (Originally 07/11/2019)   Zoster Vaccines- Shingrix (1 of 2) 07/03/2022 (Originally 09/12/1964)   COLONOSCOPY (Pts 45-48yr Insurance coverage will need to be confirmed)  04/04/2023 (Originally 02/12/2021)   DEXA SCAN  12/29/2022   Medicare  Annual Wellness (AWV)  04/04/2023   Pneumonia Vaccine 77 Years old  Completed   INFLUENZA VACCINE  Completed   Hepatitis C Screening  Completed   HPV VACCINES  Aged Out    Health Maintenance  Health Maintenance Due  Topic Date Due   DTaP/Tdap/Td (1 - Tdap) 10/26/2011    Colorectal Cancer screening: Due, patient wishes to discuss at next office visit  Mammogram status: No longer required due to age.  Bone Density status: Completed 12/29/2019, low bone density recommend follow up DEXA in 2 years  Lung Cancer Screening: (Low Dose CT Chest recommended if Age 77-80years, 30 pack-year currently smoking OR have quit w/in 15years.) does not qualify.   Lung Cancer Screening Referral: N/A  Additional Screening:  Hepatitis C Screening: does not qualify; Completed 02/26/2015  Vision Screening: Recommended annual ophthalmology exams for early detection of glaucoma and other disorders of the eye. Is the patient up to date with their annual eye exam?  Yes  Who is the provider or what is the name of the office in which the patient attends annual eye exams? She does not remember his name but knows he is with [facility] Charity would  like list of eye doctors If pt is not established with a provider, would they like to be referred to a provider to establish care? Yes .   Dental Screening: Recommended annual dental exams for proper oral hygiene  Community Resource Referral / Chronic Care Management: CRR required this visit?  No   CCM required this visit?  No      Plan:     I have personally reviewed and noted the following in the patient's chart:   Medical and social history Use of alcohol, tobacco or illicit drugs  Current medications and supplements including opioid prescriptions. Patient is not currently taking opioid prescriptions. Functional ability and status Nutritional status Physical activity Advanced directives List of other physicians Hospitalizations, surgeries, and ER visits  in previous 12 months Vitals Screenings to include cognitive, depression, and falls Referrals and appointments  In addition, I have reviewed and discussed with patient certain preventive protocols, quality metrics, and best practice recommendations. A written personalized care plan for preventive services as well as general preventive health recommendations were provided to patient.     HRossie Muskrat CMA   04/03/2022   Nurse Notes: MyChart message sent to pt with a list of eye doctors she can call to schedule with

## 2022-05-19 ENCOUNTER — Other Ambulatory Visit: Payer: Self-pay | Admitting: Internal Medicine

## 2022-05-19 ENCOUNTER — Other Ambulatory Visit: Payer: Self-pay

## 2022-05-30 ENCOUNTER — Other Ambulatory Visit: Payer: Self-pay | Admitting: Internal Medicine

## 2022-06-14 ENCOUNTER — Telehealth (INDEPENDENT_AMBULATORY_CARE_PROVIDER_SITE_OTHER): Payer: Medicare PPO | Admitting: Family Medicine

## 2022-06-14 ENCOUNTER — Encounter: Payer: Self-pay | Admitting: Family Medicine

## 2022-06-14 VITALS — BP 133/84 | Temp 98.9°F | Ht <= 58 in | Wt 191.0 lb

## 2022-06-14 DIAGNOSIS — J069 Acute upper respiratory infection, unspecified: Secondary | ICD-10-CM

## 2022-06-14 NOTE — Progress Notes (Signed)
Virtual Visit via Video Note I connected with Shelley Hayes on 06/14/22 by a video enabled telemedicine application and verified that I am speaking with the correct person using two identifiers. Location patient: home Location provider:work or home office Persons participating in the virtual visit: patient, provider  I discussed the limitations of evaluation and management by telemedicine and the availability of in person appointments. The patient expressed understanding and agreed to proceed.  Chief Complaint  Patient presents with   Generalized Body Aches    Patient complains of body aches, x1 day, Tried Coricidin    Fever    Patient complains of fever of 99 yesterday   Nasal Congestion    Patient complains of nasal congestion, x1 day    HPI: [name] is a 77 yo female with history of allergy rhinitis, GERD, hypertension, hyperlipidemia, and history of breast cancer complaining of upper respiratory symptoms that is started at 3 AM today.  She has been diligent in monitoring her temperature, which showed an initial reading of 103F, subsequently decreasing to 100F, then to 99.43F, and finally registering at 98.77F before her visit. To alleviate her symptoms, she has administered ibuprofen x 1 this morning and coricidin.  Her symptoms include a "slight" frontal pressure headache, nasal congestion, rhinorrhea, and postnasal drainage. Mild hoarseness. She is feeling better after taking ibuprofen.  She clarifies that she is not experiencing earache, sore throat, cough, stridor, wheezing, dyspnea, abdominal pain, nausea, vomiting, changes in bowel habits, urinary symptoms, or skin rash.  She notes that she has been in proximity to someone who has been ill recently, she was told it was not COVID. She has conducted an at-home COVID test, which resulted in a negative outcome. She adds that the COVID 19 test she used she has had it since 2020.  ROS: See pertinent positives and negatives per  HPI.  Past Medical History:  Diagnosis Date   ALLERGIC RHINITIS    Allergic rhinitis 08/23/2009   Allergy    Anxiety    ARTHRITIS    Arthritis    Blood in stool 04/24/2017   Cancer 05/2021   right breast DCIS   Cataract    bil cataracts removes   Cervical paraspinal muscle spasm 12/23/2015   Chest pain 04/24/2017   CHEST PAIN 12/19/2009   Qualifier: Diagnosis of  By: Marca Ancona RMA, Lucy     COLONIC POLYPS, HX OF 08/23/2009   Qualifier: Diagnosis of  By: Marca Ancona RMA, Lucy     Cough 06/19/2016   Fluttering heart 12/23/2015   GERD (gastroesophageal reflux disease)    Glaucoma    GLUCOMA    Heart murmur    Herpes zoster without complication 0000000   Hyperglycemia 06/19/2016   HYPERLIPIDEMIA    HYPERTENSION    KNEE PAIN 08/23/2009   Qualifier: Diagnosis of  By: Asa Lente MD, Valerie A    Muscle cramps 06/19/2016   Osteopenia 04/26/2016   2/18 - dexa - RFN and LFN  -1.2   OTITIS EXTERNA, RIGHT 01/20/2010   Qualifier: Diagnosis of  By: Asa Lente MD, Valerie A    Pre-diabetes    Prediabetes 04/24/2017   SCIATICA, LEFT 11/2010    Past Surgical History:  Procedure Laterality Date   BREAST BIOPSY Right    benign   BREAST LUMPECTOMY WITH RADIOACTIVE SEED LOCALIZATION Right 06/28/2021   Procedure: RIGHT BREAST LUMPECTOMY WITH RADIOACTIVE SEED LOCALIZATION;  Surgeon: Erroll Luna, MD;  Location: [facility];  Service: General;  Laterality: Right;   BREAST  RECONSTRUCTION Right 07/05/2021   Procedure: BREAST RECONSTRUCTION;  Surgeon: Irene Limbo, MD;  Location: [facility];  Service: Plastics;  Laterality: Right;   BREAST REDUCTION SURGERY Left 07/05/2021   Procedure: MAMMARY REDUCTION  (BREAST);  Surgeon: Irene Limbo, MD;  Location: [facility];  Service: Plastics;  Laterality: Left;   COLONOSCOPY     ESOPHAGOGASTRODUODENOSCOPY     POLYPECTOMY     TUBAL LIGATION     UPPER GASTROINTESTINAL ENDOSCOPY      Family History   Problem Relation Age of Onset   Arthritis Mother    Diabetes Mother    Colon cancer Mother 80   Colon polyps Mother    Arthritis Father    Alcohol abuse Brother    Lung cancer Brother    Prostate cancer Brother    Diabetes Brother    Hypertension Brother    Lung cancer Other        Neice   Esophageal cancer Neg Hx    Liver cancer Neg Hx    Pancreatic cancer Neg Hx    Rectal cancer Neg Hx    Stomach cancer Neg Hx     Social History   Socioeconomic History   Marital status: Divorced    Spouse name: Not on file   Number of children: 2   Years of education: Not on file   Highest education level: Not on file  Occupational History   Occupation: Retired  Tobacco Use   Smoking status: Never   Smokeless tobacco: Never  Vaping Use   Vaping Use: Never used  Substance and Sexual Activity   Alcohol use: No    Alcohol/week: 0.0 standard drinks of alcohol   Drug use: No   Sexual activity: Not Currently  Other Topics Concern   Not on file  Social History Narrative   Divorced, lives alone- in [place] for 61yrs but moved "home" to [place] in 2008      Walking regularly, every morning   Social Determinants of Health   Financial Resource Strain: [employer]  (04/03/2022)   Overall Financial Resource Strain (CARDIA)    Difficulty of Paying Living Expenses: Not hard at all  Food Insecurity: No Food Insecurity (04/03/2022)   Hunger Vital Sign    Worried About Running Out of Food in the Last Year: Never true    [place] in the Last Year: Never true  Transportation Needs: No Transportation Needs (04/03/2022)   PRAPARE - Hydrologist (Medical): No    Lack of Transportation (Non-Medical): No  Physical Activity: Sufficiently Active (04/03/2022)   Exercise Vital Sign    Days of Exercise per Week: 3 days    Minutes of Exercise per Session: 60 min  Stress: No Stress Concern Present (04/03/2022)   [place]    Feeling of Stress : Not at all  Social Connections: Unknown (04/03/2022)   Social Connection and Isolation Panel [NHANES]    Frequency of Communication with Friends and Family: More than three times a week    Frequency of Social Gatherings with Friends and Family: More than three times a week    Attends Religious Services: More than 4 times per year    Active Member of [employer] or Organizations: Yes    Attends Archivist Meetings: More than 4 times per year    Marital Status: Not on file  Intimate Partner Violence: Not At Risk (04/03/2022)  Humiliation, Afraid, Rape, and Kick questionnaire    Fear of Current or Ex-Partner: No    Emotionally Abused: No    Physically Abused: No    Sexually Abused: No     Current Outpatient Medications:    albuterol (VENTOLIN HFA) 108 (90 Base) MCG/ACT inhaler, Inhale 2 puffs into the lungs every 6 (six) hours as needed for wheezing or shortness of breath., Disp: 18 g, Rfl: 5   Alcohol Swabs (B-D SINGLE USE SWABS REGULAR) PADS, USE AS DIRECTED, Disp: 100 each, Rfl: 1   ALPRAZolam (XANAX) 0.5 MG tablet, Take 1 tablet (0.5 mg total) by mouth 2 (two) times daily as needed for anxiety or sleep., Disp: 30 tablet, Rfl: 0   amLODipine (NORVASC) 10 MG tablet, TAKE 1 TABLET EVERY DAY, Disp: 90 tablet, Rfl: 3   Ascorbic Acid (VITAMIN C) 1000 MG tablet, Take 2,000 mg by mouth daily., Disp: , Rfl:    aspirin 81 MG tablet, Take 81 mg by mouth daily., Disp: , Rfl:    atorvastatin (LIPITOR) 40 MG tablet, TAKE 1 TABLET EVERY DAY, Disp: 90 tablet, Rfl: 3   Cholecalciferol (VITAMIN D3) 1000 UNITS CAPS, Take 1 capsule (1,000 Units total) by mouth daily., Disp: 30 capsule, Rfl:    hydrochlorothiazide (MICROZIDE) 12.5 MG capsule, TAKE 1 CAPSULE EVERY DAY, Disp: 90 capsule, Rfl: 10   irbesartan-hydrochlorothiazide (AVALIDE) 150-12.5 MG tablet, TAKE 1 TABLET EVERY DAY, Disp: 90 tablet, Rfl: 3   latanoprost (XALATAN) 0.005 % ophthalmic solution, PLACE 1  DROP INTO BOTH EYES AT BEDTIME., Disp: 3 mL, Rfl: 0   Multiple Vitamin (MULTIVITAMIN) tablet, Take 1 tablet by mouth daily., Disp: , Rfl:    potassium chloride SA (KLOR-CON M) 20 MEQ tablet, TAKE 1 TABLET (20 MEQ TOTAL) BY MOUTH DAILY., Disp: 90 tablet, Rfl: 3   vitamin B-12 (CYANOCOBALAMIN) 1000 MCG tablet, Take 1 tablet (1,000 mcg total) by mouth daily., Disp: , Rfl:   EXAM:  VITALS per patient if applicable:BP [id number]   Temp 98.9 F (37.2 C)   Ht 4\' 9"  (1.448 m)   Wt 191 lb (86.6 kg)   BMI 41.33 kg/m   GENERAL: alert, oriented, appears well and in no acute distress  HEENT: atraumatic, conjunctiva clear, no obvious abnormalities on inspection of external nose and ears  NECK: normal movements of the head and neck  LUNGS: on inspection no signs of respiratory distress, breathing rate appears normal, no obvious gross SOB, gasping or wheezing  CV: no obvious cyanosis  MS: moves all visible extremities without noticeable abnormality  PSYCH/NEURO: pleasant and cooperative, no obvious depression or anxiety, speech and thought processing grossly intact  ASSESSMENT AND PLAN:  Discussed the following assessment and plan:  URI, acute Symptoms suggests a viral etiology, I explained patient that symptomatic is recommended. Symptoms just started today, so monitor for new symptoms. Adequate hydration, rest, Tylenol 500 mg 3-4 times per day. Continue monitoring temperature. Contact precautions recommended. She is coming tomorrow morning to have COVID-19 and rapid flu test done here in the office. F/U as needed. Instructed about warning signs.  We discussed possible serious and likely etiologies, options for evaluation and workup, limitations of telemedicine visit vs in person visit, treatment, treatment risks and precautions. The patient was advised to call back or seek an in-person evaluation if the symptoms worsen or if the condition fails to improve as anticipated. I discussed the  assessment and treatment plan with the patient. The patient was provided an opportunity to ask questions and all were  answered. The patient agreed with the plan and demonstrated an understanding of the instructions.  Return if symptoms worsen or fail to improve, for [place] here in the ofcie for covid 19 and flu test.  Oda Placke Martinique, MD

## 2022-06-15 ENCOUNTER — Other Ambulatory Visit: Payer: Self-pay | Admitting: Family Medicine

## 2022-06-15 ENCOUNTER — Telehealth: Payer: Self-pay | Admitting: Internal Medicine

## 2022-06-15 ENCOUNTER — Telehealth: Payer: Self-pay

## 2022-06-15 DIAGNOSIS — U071 COVID-19: Secondary | ICD-10-CM

## 2022-06-15 MED ORDER — NIRMATRELVIR/RITONAVIR (PAXLOVID)TABLET: 3.0000 | ORAL_TABLET | Freq: Two times a day (BID) | ORAL | 0 refills | Status: AC

## 2022-06-15 NOTE — Telephone Encounter (Signed)
I called and spoke with pt, she did perform a COVID 19 test at home this morning and it was positive. I sent Rx for Paxlovid. Instructed to hold on Atorvastatin while taking antiviral medication. She is having a lot of body aches, no new symptoms. Recommend taking Tylenol 500 mg q 6 hours as needed. Instructed about warning signs. She voices understanding and will have her daughter pick up Rx. Rasha Ibe Martinique, MD

## 2022-06-15 NOTE — Telephone Encounter (Signed)
-----   Message from Betty G Martinique, MD sent at 06/14/2022  5:19 PM EDT ----- Regarding: Rapid test I told Ms. Sutter to be here tomorrow morning to have rapid flu and COVID-19 test. Thanks, BJ

## 2022-06-15 NOTE — Telephone Encounter (Signed)
I spoke with the patient and informed her that we were unable to test her today due to her not being seen in office in regards to her virtual visit on 06/14/2022. Per office protocol.

## 2022-06-15 NOTE — Telephone Encounter (Signed)
PT calls today following up from their virtual visit with Dr.Jordan. PT was advised to come into their office to perform both a covid test and a flu test. She received a call this morning informing her to stay home as there is no one currently at [facility] to conduct the tests. PT did take an at home covid test which showed as positive.  PT is currently in the middle on how to proceed. (Covid+ positive as recently as yesterday, and symptoms started just yesterday). I informed her that I wasn't sure if we would even be able to follow up on a visit that was started with another provider.  CB: [phone]

## 2022-06-15 NOTE — Telephone Encounter (Signed)
Paxlovid sent in by Dr. Martinique.

## 2022-06-29 ENCOUNTER — Encounter: Payer: Self-pay | Admitting: Internal Medicine

## 2022-06-29 NOTE — Progress Notes (Signed)
Subjective:    Patient ID: Shelley Hayes, female    DOB: 1945/12/28, 77 y.o.   MRN: [id number]      HPI Shelley Hayes is here for a Physical exam and her chronic medical problems.    Stopped atorvastatin - cramping and pain in legs.  She does feel better off of it, but admits she still has some pain.  She would like to avoid going back on a cholesterol medication if possible.  She is working on her diet and weight loss    Shelley Hayes 4/6 - pain from posterior right hip to right calf-since then she has been using a cane and she feels like her balance is off.  Hands and feet numb - wakes up with them feeling like they are asleep.   Has a pressure to urinate and then has to sit there to pee. Then will go a small amount.  She has urinary frequency.    Weight concerns   Medications and allergies reviewed with patient and updated if appropriate.  Current Outpatient Medications on File Prior to Visit  Medication Sig Dispense Refill   albuterol (VENTOLIN HFA) 108 (90 Base) MCG/ACT inhaler Inhale 2 puffs into the lungs every 6 (six) hours as needed for wheezing or shortness of breath. 18 g 5   Alcohol Swabs (B-D SINGLE USE SWABS REGULAR) PADS USE AS DIRECTED 100 each 1   ALPRAZolam (XANAX) 0.5 MG tablet Take 1 tablet (0.5 mg total) by mouth 2 (two) times daily as needed for anxiety or sleep. 30 tablet 0   amLODipine (NORVASC) 10 MG tablet TAKE 1 TABLET EVERY DAY 90 tablet 3   Ascorbic Acid (VITAMIN C) 1000 MG tablet Take 2,000 mg by mouth daily.     aspirin 81 MG tablet Take 81 mg by mouth daily.     Cholecalciferol (VITAMIN D3) 1000 UNITS CAPS Take 1 capsule (1,000 Units total) by mouth daily. 30 capsule    hydrochlorothiazide (MICROZIDE) 12.5 MG capsule TAKE 1 CAPSULE EVERY DAY 90 capsule 10   irbesartan-hydrochlorothiazide (AVALIDE) 150-12.5 MG tablet TAKE 1 TABLET EVERY DAY 90 tablet 3   latanoprost (XALATAN) 0.005 % ophthalmic solution PLACE 1 DROP INTO BOTH EYES AT BEDTIME. 3 mL 0   Multiple  Vitamin (MULTIVITAMIN) tablet Take 1 tablet by mouth daily.     potassium chloride SA (KLOR-CON M) 20 MEQ tablet TAKE 1 TABLET (20 MEQ TOTAL) BY MOUTH DAILY. 90 tablet 3   vitamin B-12 (CYANOCOBALAMIN) 1000 MCG tablet Take 1 tablet (1,000 mcg total) by mouth daily.     atorvastatin (LIPITOR) 40 MG tablet TAKE 1 TABLET EVERY DAY (Patient not taking: Reported on 06/30/2022) 90 tablet 3   No current facility-administered medications on file prior to visit.    Review of Systems  Constitutional:  Negative for fever.  Eyes:  Negative for visual disturbance.  Respiratory:  Positive for cough (residual cough from covid - improving), shortness of breath (chronic) and wheezing (occ - improved since covid).   Cardiovascular:  Negative for chest pain, palpitations and leg swelling.  Gastrointestinal:  Negative for abdominal pain, blood in stool, constipation and diarrhea.       Occ gerd  Genitourinary:  Positive for difficulty urinating and frequency. Negative for dysuria and hematuria.  Musculoskeletal:  Positive for arthralgias and gait problem (since fall and right leg pain). Negative for back pain.       Shelley Hayes 4/6 - pain from posterior right hip to right calf  Skin:  Negative for  rash.  Neurological:  Positive for numbness (hands/feet feel asleep when sleeping only). Negative for weakness, light-headedness and headaches.  Psychiatric/Behavioral:  Negative for dysphoric mood. The patient is not nervous/anxious.        Objective:   Vitals:   06/30/22 1315  BP: 120/72  Pulse: 80  Temp: 98.5 F (36.9 C)  SpO2: 97%   Filed Weights   06/30/22 1315  Weight: 180 lb (81.6 kg)   Body mass index is 38.95 kg/m.  BP Readings from Last 3 Encounters:  06/30/22 120/72  06/14/22 133/84  12/28/21 132/82    Wt Readings from Last 3 Encounters:  06/30/22 180 lb (81.6 kg)  06/14/22 191 lb (86.6 kg)  12/28/21 191 lb (86.6 kg)       Physical Exam Constitutional: She appears well-developed and  well-nourished. No distress.  HENT:  Head: Normocephalic and atraumatic.  Right Ear: External ear normal. Normal ear canal and TM Left Ear: External ear normal.  Normal ear canal and TM Mouth/Throat: Oropharynx is clear and moist.  Eyes: Conjunctivae normal.  Neck: Neck supple. No tracheal deviation present. No thyromegaly present.  No carotid bruit  Cardiovascular: Normal rate, regular rhythm and normal heart sounds.   No murmur heard.  No edema. Pulmonary/Chest: Effort normal and breath sounds normal. No respiratory distress. She has no wheezes. She has no rales.  Breast: deferred   Abdominal: Soft. She exhibits no distension. There is no tenderness.  Lymphadenopathy: She has no cervical adenopathy. Musculoskeletal: No lower back pain, buttock pain or right hip or leg pain with palpation Skin: Skin is warm and dry. She is not diaphoretic.  Psychiatric: She has a normal mood and affect. Her behavior is normal.     Lab Results  Component Value Date   WBC 7.4 12/28/2021   HGB 12.9 12/28/2021   HCT 40.3 12/28/2021   PLT 327.0 12/28/2021   GLUCOSE 91 12/28/2021   CHOL 246 (H) 12/28/2021   TRIG 116.0 12/28/2021   HDL 45.20 12/28/2021   LDLDIRECT 142.0 06/22/2021   LDLCALC 178 (H) 12/28/2021   ALT 10 12/28/2021   AST 20 12/28/2021   NA 140 12/28/2021   K 3.8 12/28/2021   CL 105 12/28/2021   CREATININE 0.88 12/28/2021   BUN 14 12/28/2021   CO2 29 12/28/2021   TSH 0.76 12/22/2020   HGBA1C 5.6 12/28/2021         Assessment & Plan:   Physical exam: Screening blood work  ordered Exercise  none Weight  obese Substance abuse  none   Reviewed recommended immunizations.   Health Maintenance  Topic Date Due   DTaP/Tdap/Td (1 - Tdap) 10/26/2011   COVID-19 Vaccine (2 - Janssen risk series) 07/11/2019   Zoster Vaccines- Shingrix (1 of 2) 07/03/2022 (Originally 09/12/1964)   COLONOSCOPY (Pts 45-35yrs Insurance coverage will need to be confirmed)  04/04/2023 (Originally  02/12/2021)   INFLUENZA VACCINE  10/12/2022   DEXA SCAN  12/29/2022   Medicare Annual Wellness (AWV)  04/04/2023   Pneumonia Vaccine 43+ Years old  Completed   Hepatitis C Screening  Completed   HPV VACCINES  Aged Out          See Problem List for Assessment and Plan of chronic medical problems.

## 2022-06-29 NOTE — Patient Instructions (Addendum)
Blood work was ordered.   The lab is on the first floor.    Medications changes include :   naprosyn 500 mg twice daily WITH FOOD      Return in about 6 months (around 12/30/2022) for follow up.   Health Maintenance, Female Adopting a healthy lifestyle and getting preventive care are important in promoting health and wellness. Ask your health care provider about: The right schedule for you to have regular tests and exams. Things you can do on your own to prevent diseases and keep yourself healthy. What should I know about diet, weight, and exercise? Eat a healthy diet  Eat a diet that includes plenty of vegetables, fruits, low-fat dairy products, and lean protein. Do not eat a lot of foods that are high in solid fats, added sugars, or sodium. Maintain a healthy weight Body mass index (BMI) is used to identify weight problems. It estimates body fat based on height and weight. Your health care provider can help determine your BMI and help you achieve or maintain a healthy weight. Get regular exercise Get regular exercise. This is one of the most important things you can do for your health. Most adults should: Exercise for at least 150 minutes each week. The exercise should increase your heart rate and make you sweat (moderate-intensity exercise). Do strengthening exercises at least twice a week. This is in addition to the moderate-intensity exercise. Spend less time sitting. Even light physical activity can be beneficial. Watch cholesterol and blood lipids Have your blood tested for lipids and cholesterol at 77 years of age, then have this test every 5 years. Have your cholesterol levels checked more often if: Your lipid or cholesterol levels are high. You are older than 77 years of age. You are at high risk for heart disease. What should I know about cancer screening? Depending on your health history and family history, you may need to have cancer screening at various  ages. This may include screening for: Breast cancer. Cervical cancer. Colorectal cancer. Skin cancer. Lung cancer. What should I know about heart disease, diabetes, and high blood pressure? Blood pressure and heart disease High blood pressure causes heart disease and increases the risk of stroke. This is more likely to develop in people who have high blood pressure readings or are overweight. Have your blood pressure checked: Every 3-5 years if you are 69-23 years of age. Every year if you are 38 years old or older. Diabetes Have regular diabetes screenings. This checks your fasting blood sugar level. Have the screening done: Once every three years after age 60 if you are at a normal weight and have a low risk for diabetes. More often and at a younger age if you are overweight or have a high risk for diabetes. What should I know about preventing infection? Hepatitis B If you have a higher risk for hepatitis B, you should be screened for this virus. Talk with your health care provider to find out if you are at risk for hepatitis B infection. Hepatitis C Testing is recommended for: Everyone born from [birth year] through 1965. Anyone with known risk factors for hepatitis C. Sexually transmitted infections (STIs) Get screened for STIs, including gonorrhea and chlamydia, if: You are sexually active and are younger than 77 years of age. You are older than 77 years of age and your health care provider tells you that you are at risk for this type of infection. Your sexual activity has changed since you  were last screened, and you are at increased risk for chlamydia or gonorrhea. Ask your health care provider if you are at risk. Ask your health care provider about whether you are at high risk for HIV. Your health care provider may recommend a prescription medicine to help prevent HIV infection. If you choose to take medicine to prevent HIV, you should first get tested for HIV. You should then be tested  every 3 months for as long as you are taking the medicine. Pregnancy If you are about to stop having your period (premenopausal) and you may become pregnant, seek counseling before you get pregnant. Take 400 to 800 micrograms (mcg) of folic acid every day if you become pregnant. Ask for birth control (contraception) if you want to prevent pregnancy. Osteoporosis and menopause Osteoporosis is a disease in which the bones lose minerals and strength with aging. This can result in bone fractures. If you are 76 years old or older, or if you are at risk for osteoporosis and fractures, ask your health care provider if you should: Be screened for bone loss. Take a calcium or vitamin D supplement to lower your risk of fractures. Be given hormone replacement therapy (HRT) to treat symptoms of menopause. Follow these instructions at home: Alcohol use Do not drink alcohol if: Your health care provider tells you not to drink. You are pregnant, may be pregnant, or are planning to become pregnant. If you drink alcohol: Limit how much you have to: 0-1 drink a day. Know how much alcohol is in your drink. In the [place] one drink equals one 12 oz bottle of beer (355 mL), one 5 oz glass of wine (148 mL), or one 1 oz glass of hard liquor (44 mL). Lifestyle Do not use any products that contain nicotine or tobacco. These products include cigarettes, chewing tobacco, and vaping devices, such as e-cigarettes. If you need help quitting, ask your health care provider. Do not use street drugs. Do not share needles. Ask your health care provider for help if you need support or information about quitting drugs. General instructions Schedule regular health, dental, and eye exams. Stay current with your vaccines. Tell your health care provider if: You often feel depressed. You have ever been abused or do not feel safe at home. Summary Adopting a healthy lifestyle and getting preventive care are important in promoting  health and wellness. Follow your health care provider's instructions about healthy diet, exercising, and getting tested or screened for diseases. Follow your health care provider's instructions on monitoring your cholesterol and blood pressure. This information is not intended to replace advice given to you by your health care provider. Make sure you discuss any questions you have with your health care provider. Document Revised: 07/19/2020 Document Reviewed: 07/19/2020 Elsevier Patient Education  [employer].

## 2022-06-30 ENCOUNTER — Ambulatory Visit (INDEPENDENT_AMBULATORY_CARE_PROVIDER_SITE_OTHER): Payer: Medicare PPO | Admitting: Internal Medicine

## 2022-06-30 VITALS — BP 120/72 | HR 80 | Temp 98.5°F | Ht <= 58 in | Wt 180.0 lb

## 2022-06-30 DIAGNOSIS — Z0000 Encounter for general adult medical examination without abnormal findings: Secondary | ICD-10-CM

## 2022-06-30 DIAGNOSIS — R202 Paresthesia of skin: Secondary | ICD-10-CM

## 2022-06-30 DIAGNOSIS — R200 Anesthesia of skin: Secondary | ICD-10-CM | POA: Diagnosis not present

## 2022-06-30 DIAGNOSIS — M79604 Pain in right leg: Secondary | ICD-10-CM | POA: Diagnosis not present

## 2022-06-30 DIAGNOSIS — N289 Disorder of kidney and ureter, unspecified: Secondary | ICD-10-CM

## 2022-06-30 DIAGNOSIS — F419 Anxiety disorder, unspecified: Secondary | ICD-10-CM | POA: Diagnosis not present

## 2022-06-30 DIAGNOSIS — M85862 Other specified disorders of bone density and structure, left lower leg: Secondary | ICD-10-CM

## 2022-06-30 DIAGNOSIS — I10 Essential (primary) hypertension: Secondary | ICD-10-CM | POA: Diagnosis not present

## 2022-06-30 DIAGNOSIS — E782 Mixed hyperlipidemia: Secondary | ICD-10-CM | POA: Diagnosis not present

## 2022-06-30 DIAGNOSIS — M85861 Other specified disorders of bone density and structure, right lower leg: Secondary | ICD-10-CM | POA: Diagnosis not present

## 2022-06-30 DIAGNOSIS — Z8673 Personal history of transient ischemic attack (TIA), and cerebral infarction without residual deficits: Secondary | ICD-10-CM | POA: Diagnosis not present

## 2022-06-30 DIAGNOSIS — R7303 Prediabetes: Secondary | ICD-10-CM

## 2022-06-30 LAB — COMPREHENSIVE METABOLIC PANEL
ALT: 13 U/L (ref 0–35)
AST: 25 U/L (ref 0–37)
Albumin: 4.5 g/dL (ref 3.5–5.2)
Alkaline Phosphatase: 66 U/L (ref 39–117)
BUN: 12 mg/dL (ref 6–23)
CO2: 29 mEq/L (ref 19–32)
Calcium: 10 mg/dL (ref 8.4–10.5)
Chloride: 102 mEq/L (ref 96–112)
Creatinine, Ser: 1.08 mg/dL (ref 0.40–1.20)
GFR: 49.8 mL/min — ABNORMAL LOW (ref >60.00)
Glucose, Bld: 95 mg/dL (ref 70–99)
Potassium: 3.9 mEq/L (ref 3.5–5.1)
Sodium: 142 mEq/L (ref 135–145)
Total Bilirubin: 0.5 mg/dL (ref 0.2–1.2)
Total Protein: 8.2 g/dL (ref 6.0–8.3)

## 2022-06-30 LAB — CBC WITH DIFFERENTIAL/PLATELET
Basophils Absolute: 0 10*3/uL (ref 0.0–0.1)
Basophils Relative: 0.6 % (ref 0.0–3.0)
Eosinophils Absolute: 0.1 10*3/uL (ref 0.0–0.7)
Eosinophils Relative: 1.4 % (ref 0.0–5.0)
HCT: 43.2 % (ref 36.0–46.0)
Hemoglobin: 13.9 g/dL (ref 12.0–15.0)
Lymphocytes Relative: 26.6 % (ref 12.0–46.0)
Lymphs Abs: 2 10*3/uL (ref 0.7–4.0)
MCHC: 32.3 g/dL (ref 30.0–36.0)
MCV: 86.8 fl (ref 78.0–100.0)
Monocytes Absolute: 0.9 10*3/uL (ref 0.1–1.0)
Monocytes Relative: 11.6 % (ref 3.0–12.0)
Neutro Abs: 4.5 10*3/uL (ref 1.4–7.7)
Neutrophils Relative %: 59.8 % (ref 43.0–77.0)
Platelets: 383 10*3/uL (ref 150.0–400.0)
RBC: 4.98 Mil/uL (ref 3.87–5.11)
RDW: 14.1 % (ref 11.5–15.5)
WBC: 7.6 10*3/uL (ref 4.0–10.5)

## 2022-06-30 LAB — TSH: TSH: 1.05 u[IU]/mL (ref 0.35–5.50)

## 2022-06-30 LAB — LIPID PANEL
Cholesterol: 237 mg/dL — ABNORMAL HIGH (ref 0–200)
HDL: 41.5 mg/dL (ref >39.00)
LDL Cholesterol: 173 mg/dL — ABNORMAL HIGH (ref 0–99)
NonHDL: 195.7
Total CHOL/HDL Ratio: 6
Triglycerides: 116 mg/dL (ref 0.0–149.0)
VLDL: 23.2 mg/dL (ref 0.0–40.0)

## 2022-06-30 LAB — VITAMIN B12: Vitamin B-12: 604 pg/mL (ref 211–911)

## 2022-06-30 LAB — HEMOGLOBIN A1C: Hgb A1c MFr Bld: 5.5 % (ref 4.6–6.5)

## 2022-06-30 MED ORDER — NAPROXEN 500 MG PO TBEC: 500.0000 mg | DELAYED_RELEASE_TABLET | Freq: Two times a day (BID) | ORAL | 0 refills | Status: DC

## 2022-06-30 NOTE — Assessment & Plan Note (Addendum)
Chronic Regular exercise and healthy diet encouraged Check lipid panel, CMP, TSH Stopped atorvastatin due to muscle pain and cramping in legs-will reevaluate after recheck her blood work today, but she does need to be on it with her history

## 2022-06-30 NOTE — Assessment & Plan Note (Signed)
Acute Related to fall that occurred on 4/6 Has pain from her right buttock region down to her right calf No numbness, tingling She is walking with a cane since then-no improvement Likely lumbar radiculopathy Deferred sports medicine referral, physical therapy referral and steroids Will try naproxen 500 mg twice daily with food She does have exercises at home that she can do-stressed that she needs to do this Call if no improvement

## 2022-06-30 NOTE — Assessment & Plan Note (Signed)
Chronic History of CVA Continue aspirin 81 mg daily, atorvastatin 40 mg daily Blood pressure well controlled Encouraged healthy diet, regular exercise 

## 2022-06-30 NOTE — Assessment & Plan Note (Addendum)
Chronic situational Controlled Continue alprazolam 0.5 mg twice daily as needed

## 2022-06-30 NOTE — Assessment & Plan Note (Signed)
Chronic DEXA up-to-date Continue regular walking Continue vitamin D, multivitamin 

## 2022-06-30 NOTE — Assessment & Plan Note (Signed)
New Occurs in her hands and her feet mostly when sleeping Not something she experiences during the day Discussed if this persists or worsens can consider neurology evaluation, but at this time I think we can just monitor

## 2022-06-30 NOTE — Assessment & Plan Note (Signed)
Chronic Blood pressure controlled CMP, CBC Continue amlodipine 10 mg daily, HCTZ 12.5 mg daily, Avalide 150-12.5 mg daily

## 2022-06-30 NOTE — Assessment & Plan Note (Signed)
Chronic Encouraged weight loss Advised regular exercise-5 days a week for at least 30 minutes Discussed decreased portions, diet low in sugars and carbs-high in protein, vegetables and good fiber

## 2022-06-30 NOTE — Assessment & Plan Note (Signed)
Chronic Check a1c Low sugar / carb diet Stressed regular exercise  

## 2022-07-01 NOTE — Addendum Note (Signed)
Addended by: Pincus Sanes on: 07/01/2022 02:20 PM   Modules accepted: Orders

## 2022-07-04 ENCOUNTER — Other Ambulatory Visit: Payer: Self-pay | Admitting: Obstetrics and Gynecology

## 2022-07-04 DIAGNOSIS — D0511 Intraductal carcinoma in situ of right breast: Secondary | ICD-10-CM

## 2022-07-07 DIAGNOSIS — H401234 Low-tension glaucoma, bilateral, indeterminate stage: Secondary | ICD-10-CM | POA: Diagnosis not present

## 2022-07-12 ENCOUNTER — Other Ambulatory Visit (INDEPENDENT_AMBULATORY_CARE_PROVIDER_SITE_OTHER): Payer: Medicare PPO

## 2022-07-12 DIAGNOSIS — Z853 Personal history of malignant neoplasm of breast: Secondary | ICD-10-CM | POA: Diagnosis not present

## 2022-07-12 DIAGNOSIS — N289 Disorder of kidney and ureter, unspecified: Secondary | ICD-10-CM | POA: Diagnosis not present

## 2022-07-12 DIAGNOSIS — Z01419 Encounter for gynecological examination (general) (routine) without abnormal findings: Secondary | ICD-10-CM | POA: Diagnosis not present

## 2022-07-12 DIAGNOSIS — N952 Postmenopausal atrophic vaginitis: Secondary | ICD-10-CM | POA: Diagnosis not present

## 2022-07-12 LAB — BASIC METABOLIC PANEL
BUN: 13 mg/dL (ref 6–23)
CO2: 28 mEq/L (ref 19–32)
Calcium: 9.9 mg/dL (ref 8.4–10.5)
Chloride: 101 mEq/L (ref 96–112)
Creatinine, Ser: 1.16 mg/dL (ref 0.40–1.20)
GFR: 45.69 mL/min — ABNORMAL LOW (ref >60.00)
Glucose, Bld: 90 mg/dL (ref 70–99)
Potassium: 4.3 mEq/L (ref 3.5–5.1)
Sodium: 139 mEq/L (ref 135–145)

## 2022-07-13 ENCOUNTER — Other Ambulatory Visit: Payer: Self-pay | Admitting: Internal Medicine

## 2022-07-19 ENCOUNTER — Ambulatory Visit: Admission: RE | Admit: 2022-07-19 | Payer: Medicare PPO | Source: Ambulatory Visit

## 2022-07-19 ENCOUNTER — Ambulatory Visit
Admission: RE | Admit: 2022-07-19 | Discharge: 2022-07-19 | Disposition: A | Discharge status: Home / Self Care | Payer: Medicare PPO | Source: Ambulatory Visit | Attending: Obstetrics and Gynecology | Admitting: Obstetrics and Gynecology

## 2022-07-19 DIAGNOSIS — D0511 Intraductal carcinoma in situ of right breast: Secondary | ICD-10-CM

## 2022-07-19 DIAGNOSIS — Z1231 Encounter for screening mammogram for malignant neoplasm of breast: Secondary | ICD-10-CM | POA: Diagnosis not present

## 2022-08-01 DIAGNOSIS — N183 Chronic kidney disease, stage 3 unspecified: Secondary | ICD-10-CM | POA: Insufficient documentation

## 2022-08-01 DIAGNOSIS — R944 Abnormal results of kidney function studies: Secondary | ICD-10-CM | POA: Insufficient documentation

## 2022-08-01 DIAGNOSIS — N1831 Chronic kidney disease, stage 3a: Secondary | ICD-10-CM | POA: Insufficient documentation

## 2022-08-01 NOTE — Patient Instructions (Signed)
      Blood work was ordered.   The lab is on the first floor.    Medications changes include :   none     Return for follow up as scheduled.  

## 2022-08-01 NOTE — Progress Notes (Unsigned)
      Subjective:    Patient ID: Shelley Hayes, female    DOB: [birth year], 77 y.o.   MRN: [id number]     HPI Shelley Hayes is here for follow up of her chronic medical problems.  1 month ago kidney function was decreased on routine blood work.  Repeated 2 weeks later and still low.  Advised stopping NSAIDs-was taking naproxen for back pain.  Medications and allergies reviewed with patient and updated if appropriate.  Current Outpatient Medications on File Prior to Visit  Medication Sig Dispense Refill   albuterol (VENTOLIN HFA) 108 (90 Base) MCG/ACT inhaler Inhale 2 puffs into the lungs every 6 (six) hours as needed for wheezing or shortness of breath. 18 g 5   Alcohol Swabs (B-D SINGLE USE SWABS REGULAR) PADS USE AS DIRECTED 100 each 1   ALPRAZolam (XANAX) 0.5 MG tablet Take 1 tablet (0.5 mg total) by mouth 2 (two) times daily as needed for anxiety or sleep. 30 tablet 0   amLODipine (NORVASC) 10 MG tablet TAKE 1 TABLET EVERY DAY 90 tablet 3   Ascorbic Acid (VITAMIN C) 1000 MG tablet Take 2,000 mg by mouth daily.     aspirin 81 MG tablet Take 81 mg by mouth daily.     Cholecalciferol (VITAMIN D3) 1000 UNITS CAPS Take 1 capsule (1,000 Units total) by mouth daily. 30 capsule    hydrochlorothiazide (MICROZIDE) 12.5 MG capsule TAKE 1 CAPSULE EVERY DAY 90 capsule 10   irbesartan-hydrochlorothiazide (AVALIDE) 150-12.5 MG tablet TAKE 1 TABLET EVERY DAY 90 tablet 3   latanoprost (XALATAN) 0.005 % ophthalmic solution PLACE 1 DROP INTO BOTH EYES AT BEDTIME. 3 mL 0   Multiple Vitamin (MULTIVITAMIN) tablet Take 1 tablet by mouth daily.     potassium chloride SA (KLOR-CON M) 20 MEQ tablet TAKE 1 TABLET (20 MEQ TOTAL) BY MOUTH DAILY. 90 tablet 3   vitamin B-12 (CYANOCOBALAMIN) 1000 MCG tablet Take 1 tablet (1,000 mcg total) by mouth daily.     No current facility-administered medications on file prior to visit.     Review of Systems     Objective:  There were no vitals filed for this visit. BP  Readings from Last 3 Encounters:  06/30/22 120/72  06/14/22 133/84  12/28/21 132/82   Wt Readings from Last 3 Encounters:  06/30/22 180 lb (81.6 kg)  06/14/22 191 lb (86.6 kg)  12/28/21 191 lb (86.6 kg)   There is no height or weight on file to calculate BMI.    Physical Exam     Lab Results  Component Value Date   WBC 7.6 06/30/2022   HGB 13.9 06/30/2022   HCT 43.2 06/30/2022   PLT 383.0 06/30/2022   GLUCOSE 90 07/12/2022   CHOL 237 (H) 06/30/2022   TRIG 116.0 06/30/2022   HDL 41.50 06/30/2022   LDLDIRECT 142.0 06/22/2021   LDLCALC 173 (H) 06/30/2022   ALT 13 06/30/2022   AST 25 06/30/2022   NA 139 07/12/2022   K 4.3 07/12/2022   CL 101 07/12/2022   CREATININE 1.16 07/12/2022   BUN 13 07/12/2022   CO2 28 07/12/2022   TSH 1.05 06/30/2022   HGBA1C 5.5 06/30/2022     Assessment & Plan:    See Problem List for Assessment and Plan of chronic medical problems.

## 2022-08-02 ENCOUNTER — Ambulatory Visit (INDEPENDENT_AMBULATORY_CARE_PROVIDER_SITE_OTHER): Payer: Medicare PPO | Admitting: Internal Medicine

## 2022-08-02 ENCOUNTER — Encounter: Payer: Self-pay | Admitting: Internal Medicine

## 2022-08-02 VITALS — BP 120/74 | HR 67 | Temp 98.0°F | Ht <= 58 in | Wt 181.0 lb

## 2022-08-02 DIAGNOSIS — R944 Abnormal results of kidney function studies: Secondary | ICD-10-CM

## 2022-08-02 DIAGNOSIS — E876 Hypokalemia: Secondary | ICD-10-CM | POA: Diagnosis not present

## 2022-08-02 DIAGNOSIS — I10 Essential (primary) hypertension: Secondary | ICD-10-CM | POA: Diagnosis not present

## 2022-08-02 DIAGNOSIS — R7303 Prediabetes: Secondary | ICD-10-CM | POA: Diagnosis not present

## 2022-08-02 DIAGNOSIS — R9341 Abnormal radiologic findings on diagnostic imaging of renal pelvis, ureter, or bladder: Secondary | ICD-10-CM

## 2022-08-02 LAB — BASIC METABOLIC PANEL
BUN: 13 mg/dL (ref 6–23)
CO2: 29 mEq/L (ref 19–32)
Calcium: 9.7 mg/dL (ref 8.4–10.5)
Chloride: 101 mEq/L (ref 96–112)
Creatinine, Ser: 0.94 mg/dL (ref 0.40–1.20)
GFR: 58.79 mL/min — ABNORMAL LOW (ref >60.00)
Glucose, Bld: 89 mg/dL (ref 70–99)
Potassium: 4 mEq/L (ref 3.5–5.1)
Sodium: 140 mEq/L (ref 135–145)

## 2022-08-02 LAB — HEMOGLOBIN A1C: Hgb A1c MFr Bld: 5.3 % (ref 4.6–6.5)

## 2022-08-02 NOTE — Assessment & Plan Note (Addendum)
Chronic Related to diuretic use Still on hydrochlorothiazide 12.5 mg daily, but the additional hydrochlorothiazide 12.5 mg daily was discontinued BMP today May not need potassium supplementation-will see what BMP shows today Continue potassium chloride 20 mill equivalents daily for now

## 2022-08-02 NOTE — Assessment & Plan Note (Addendum)
Chronic Blood pressure controlled CMP Continue amlodipine 10 mg daily, Avalide 150-12.5 mg daily

## 2022-08-02 NOTE — Assessment & Plan Note (Signed)
New Likely related to NSAID use Has stopped all NSAIDs-advised to not take any in future Discussed the importance of good blood pressure control which is Good sugar control, which it is Advise good hydration Hydrochlorothiazide 12.5 mg discontinued Will check BMP today Renal ultrasound

## 2022-08-02 NOTE — Assessment & Plan Note (Signed)
Chronic Sugars were well-controlled last month She has been drinking a lot of cranberry juice-was told that was good for the kidneys-discussed this is likely for sugar and is not necessary for the kidneys.  Stressed to just drink water, but it is also helping some of her leg cramps which may have been improved by stopping hydrochlorothiazide Will check A1c today

## 2022-08-03 ENCOUNTER — Telehealth: Payer: Self-pay | Admitting: Gastroenterology

## 2022-08-03 NOTE — Telephone Encounter (Signed)
Good morning Dr. Russella Dar,  This patient called to schedule colonoscopy that she was due for in 2022. She is now the age of 63. Is it okay to scheduled her directly for colonoscopy or does she need a OV prior to colonoscopy? Please advise, thank you.

## 2022-08-03 NOTE — Telephone Encounter (Signed)
Review 06/30/2022 office note from her PCP and her last colonoscopy in 2021. OK for direct colonoscopy in [facility].

## 2022-08-04 ENCOUNTER — Encounter: Payer: Self-pay | Admitting: Gastroenterology

## 2022-08-11 ENCOUNTER — Ambulatory Visit
Admission: RE | Admit: 2022-08-11 | Discharge: 2022-08-11 | Disposition: A | Discharge status: Home / Self Care | Payer: Medicare PPO | Source: Ambulatory Visit | Attending: Internal Medicine | Admitting: Internal Medicine

## 2022-08-11 DIAGNOSIS — R944 Abnormal results of kidney function studies: Secondary | ICD-10-CM

## 2022-08-11 DIAGNOSIS — R9341 Abnormal radiologic findings on diagnostic imaging of renal pelvis, ureter, or bladder: Secondary | ICD-10-CM | POA: Diagnosis not present

## 2022-08-13 NOTE — Addendum Note (Signed)
Addended by: Pincus Sanes on: 08/13/2022 03:47 PM   Modules accepted: Orders

## 2022-08-29 ENCOUNTER — Ambulatory Visit (AMBULATORY_SURGERY_CENTER): Payer: Medicare PPO

## 2022-08-29 VITALS — Ht <= 58 in | Wt 181.0 lb

## 2022-08-29 DIAGNOSIS — Z86010 Personal history of colonic polyps: Secondary | ICD-10-CM

## 2022-08-29 MED ORDER — PEG 3350-KCL-NA BICARB-NACL 420 G PO SOLR
4000.0000 mL | Freq: Once | ORAL | 0 refills | Status: AC
Start: 2022-08-29 — End: 2022-08-29

## 2022-08-29 NOTE — Progress Notes (Signed)
No egg or soy allergy known to patient  No issues known to pt with past sedation with any surgeries or procedures Patient denies ever being told they had issues or difficulty with intubation  No FH of Malignant Hyperthermia Pt is not on diet pills Pt is not on  home 02  Pt is not on blood thinners  Pt denies issues with constipation  No A fib or A flutter Have any cardiac testing pending--no  LOA: independent   Patient's chart reviewed by Cathlyn Parsons CNRA prior to previsit and patient appropriate for the [facility].  Previsit completed and red dot placed by patient's name on their procedure day (on provider's schedule).     PV completed. Prep reviewed with patient. Instructions sent via mychart and to home address.

## 2022-08-30 ENCOUNTER — Encounter: Payer: Self-pay | Admitting: Gastroenterology

## 2022-09-27 ENCOUNTER — Encounter: Payer: Medicare PPO | Admitting: Gastroenterology

## 2022-09-27 ENCOUNTER — Ambulatory Visit (AMBULATORY_SURGERY_CENTER): Payer: Medicare PPO | Admitting: Gastroenterology

## 2022-09-27 ENCOUNTER — Encounter: Payer: Self-pay | Admitting: Gastroenterology

## 2022-09-27 VITALS — BP 111/49 | HR 83 | Temp 97.8°F | Resp 16 | Ht 59.0 in | Wt 181.0 lb

## 2022-09-27 DIAGNOSIS — I10 Essential (primary) hypertension: Secondary | ICD-10-CM | POA: Diagnosis not present

## 2022-09-27 DIAGNOSIS — Z86010 Personal history of colonic polyps: Secondary | ICD-10-CM | POA: Diagnosis not present

## 2022-09-27 DIAGNOSIS — D123 Benign neoplasm of transverse colon: Secondary | ICD-10-CM | POA: Diagnosis not present

## 2022-09-27 DIAGNOSIS — D128 Benign neoplasm of rectum: Secondary | ICD-10-CM | POA: Diagnosis not present

## 2022-09-27 DIAGNOSIS — E785 Hyperlipidemia, unspecified: Secondary | ICD-10-CM | POA: Diagnosis not present

## 2022-09-27 DIAGNOSIS — D125 Benign neoplasm of sigmoid colon: Secondary | ICD-10-CM

## 2022-09-27 DIAGNOSIS — F419 Anxiety disorder, unspecified: Secondary | ICD-10-CM | POA: Diagnosis not present

## 2022-09-27 DIAGNOSIS — Z09 Encounter for follow-up examination after completed treatment for conditions other than malignant neoplasm: Secondary | ICD-10-CM

## 2022-09-27 DIAGNOSIS — K6389 Other specified diseases of intestine: Secondary | ICD-10-CM

## 2022-09-27 DIAGNOSIS — D124 Benign neoplasm of descending colon: Secondary | ICD-10-CM | POA: Diagnosis not present

## 2022-09-27 DIAGNOSIS — D127 Benign neoplasm of rectosigmoid junction: Secondary | ICD-10-CM | POA: Diagnosis not present

## 2022-09-27 MED ORDER — SODIUM CHLORIDE 0.9 % IV SOLN
500.0000 mL | Freq: Once | INTRAVENOUS | Status: DC
Start: 2022-09-27 — End: 2022-09-27

## 2022-09-27 NOTE — Progress Notes (Signed)
 Called to room to assist during endoscopic procedure.  Patient ID and intended procedure confirmed with present staff. Received instructions for my participation in the procedure from the performing physician.  

## 2022-09-27 NOTE — Patient Instructions (Signed)
 Discharge instructions given. Handouts on polyps and Hemorrhoids. Resume previous medications. YOU HAD AN ENDOSCOPIC PROCEDURE TODAY AT THE [facility]:   Refer to the procedure report that was given to you for any specific questions about what was found during the examination.  If the procedure report does not answer your questions, please call your gastroenterologist to clarify.  If you requested that your care partner not be given the details of your procedure findings, then the procedure report has been included in a sealed envelope for you to review at your convenience later.  YOU SHOULD EXPECT: Some feelings of bloating in the abdomen. Passage of more gas than usual.  Walking can help get rid of the air that was put into your GI tract during the procedure and reduce the bloating. If you had a lower endoscopy (such as a colonoscopy or flexible sigmoidoscopy) you may notice spotting of blood in your stool or on the toilet paper. If you underwent a bowel prep for your procedure, you may not have a normal bowel movement for a few days.  Please Note:  You might notice some irritation and congestion in your nose or some drainage.  This is from the oxygen used during your procedure.  There is no need for concern and it should clear up in a day or so.  SYMPTOMS TO REPORT IMMEDIATELY:  Following lower endoscopy (colonoscopy or flexible sigmoidoscopy):  Excessive amounts of blood in the stool  Significant tenderness or worsening of abdominal pains  Swelling of the abdomen that is new, acute  Fever of 100F or higher   For urgent or emergent issues, a gastroenterologist can be reached at any hour by calling [phone]. Do not use MyChart messaging for urgent concerns.    DIET:  We do recommend a small meal at first, but then you may proceed to your regular diet.  Drink plenty of fluids but you should avoid alcoholic beverages for 24 hours.  ACTIVITY:  You should plan to take it  easy for the rest of today and you should NOT DRIVE or use heavy machinery until tomorrow (because of the sedation medicines used during the test).    FOLLOW UP: Our staff will call the number listed on your records the next business day following your procedure.  We will call around 7:15- 8:00 am to check on you and address any questions or concerns that you may have regarding the information given to you following your procedure. If we do not reach you, we will leave a message.     If any biopsies were taken you will be contacted by phone or by letter within the next 1-3 weeks.  Please call us at [phone] if you have not heard about the biopsies in 3 weeks.    SIGNATURES/CONFIDENTIALITY: You and/or your care partner have signed paperwork which will be entered into your electronic medical record.  These signatures attest to the fact that that the information above on your After Visit Summary has been reviewed and is understood.  Full responsibility of the confidentiality of this discharge information lies with you and/or your care-partner. 

## 2022-09-27 NOTE — Progress Notes (Signed)
Uneventful propofol anesthetic. Report to pacu rn. Vss. Care resumed by rn.

## 2022-09-27 NOTE — Op Note (Signed)
[facility] Patient Name: Shelley Hayes Procedure Date: 09/27/2022 9:38 AM MRN: [id number] Endoscopist: Meryl Dare , MD, [id number] Age: 77 Referring MD:  Date of Birth: 21-Nov-1945 Gender: Female Account #: [id number] Procedure:                Colonoscopy Indications:              Surveillance: Personal history of multiple                            adenomatous polyps on last colonoscopy 3 years ago Medicines:                Monitored Anesthesia Care Procedure:                Pre-Anesthesia Assessment:                           - Prior to the procedure, a History and Physical                            was performed, and patient medications and                            allergies were reviewed. The patient's tolerance of                            previous anesthesia was also reviewed. The risks                            and benefits of the procedure and the sedation                            options and risks were discussed with the patient.                            All questions were answered, and informed consent                            was obtained. Prior Anticoagulants: The patient has                            taken no anticoagulant or antiplatelet agents. ASA                            Grade Assessment: II - A patient with mild systemic                            disease. After reviewing the risks and benefits,                            the patient was deemed in satisfactory condition to                            undergo the procedure.  After obtaining informed consent, the colonoscope                            was passed under direct vision. Throughout the                            procedure, the patient's blood pressure, pulse, and                            oxygen saturations were monitored continuously. The                            CF [id number] #[id number] was introduced through the anus                            and advanced  to the the cecum, identified by                            appendiceal orifice and ileocecal valve. The                            ileocecal valve, appendiceal orifice, and rectum                            were photographed. The quality of the bowel                            preparation was adequate. The colonoscopy was                            performed without difficulty. The patient tolerated                            the procedure well. Scope In: 9:47:24 AM Scope Out: 10:26:50 AM Scope Withdrawal Time: 0 hours 36 minutes 16 seconds  Total Procedure Duration: 0 hours 39 minutes 26 seconds  Findings:                 The perianal and digital rectal examinations were                            normal.                           Thirty sessile polyps were found in the sigmoid                            colon (11), descending colon (7) and transverse                            colon (12). The polyps were 4 to 8 mm in size.                            These polyps were removed with a cold snare.  Resection and retrieval were complete.                           A 3 mm polyp was found in the rectum. The polyp was                            sessile. The polyp was removed with a cold biopsy                            forceps. Resection and retrieval were complete.                           A diffuse area of mild melanosis was found in the                            entire colon.                           External hemorrhoids were found during                            retroflexion. The hemorrhoids were small.                           The exam was otherwise without abnormality on                            direct and retroflexion views. Complications:            No immediate complications. Estimated blood loss:                            None. Estimated Blood Loss:     Estimated blood loss: none. Impression:               - Thirty 4 to 8 mm polyps in the sigmoid  colon, in                            the descending colon and in the transverse colon,                            removed with a cold snare. Resected and retrieved.                           - One 3 mm polyp in the rectum, removed with a cold                            biopsy forceps. Resected and retrieved.                           - Melanosis in the colon.                           - External hemorrhoids.                           -  The examination was otherwise normal on direct                            and retroflexion views. Recommendation:           - Repeat colonoscopy, likely 1 year, after studies                            are complete for surveillance based on pathology                            results.                           - Patient has a contact number available for                            emergencies. The signs and symptoms of potential                            delayed complications were discussed with the                            patient. Return to normal activities tomorrow.                            Written discharge instructions were provided to the                            patient.                           - Resume previous diet.                           - Continue present medications.                           - Await pathology results.                           - No aspirin, ibuprofen, naproxen, or other                            non-steroidal anti-inflammatory drugs for 2 weeks                            after polyp removal. Meryl Dare, MD 09/27/2022 10:35:46 AM This report has been signed electronically.

## 2022-09-27 NOTE — Progress Notes (Signed)
 Vitals-DT  Pt's states no medical or surgical changes since previsit or office visit.  

## 2022-09-27 NOTE — Progress Notes (Signed)
History & Physical  Primary Care Physician:  Pincus Sanes, MD Primary Gastroenterologist: Claudette Head, MD  Impression / Plan:  Personal history of 66 tubular adenomas on colonoscopy in 2021 for colonoscopy.  She did not return for recommended 1 year surveillance colonoscopy as recommended.  CHIEF COMPLAINT:  Personal history of colon polyps   HPI: Shelley Hayes is a 77 y.o. female with a personal history of 34 tubular adenomas on colonoscopy in 2021 for colonoscopy.  She did not return for recommended 1 year surveillance colonoscopy as recommended.   Past Medical History:  Diagnosis Date   ALLERGIC RHINITIS    Allergic rhinitis 08/23/2009   Allergy    Anxiety    ARTHRITIS    Arthritis    Blood in stool 04/24/2017   Cancer (HCC) 05/2021   right breast DCIS   Cataract    bil cataracts removes   Cervical paraspinal muscle spasm 12/23/2015   Chest pain 04/24/2017   CHEST PAIN 12/19/2009   Qualifier: Diagnosis of  By: Charlsie Quest RMA, Lucy     COLONIC POLYPS, HX OF 08/23/2009   Qualifier: Diagnosis of  By: Charlsie Quest RMA, Lucy     Cough 06/19/2016   Fluttering heart 12/23/2015   GERD (gastroesophageal reflux disease)    Glaucoma    GLUCOMA    Heart murmur    Herpes zoster without complication 04/24/2017   Hyperglycemia 06/19/2016   HYPERLIPIDEMIA    HYPERTENSION    KNEE PAIN 08/23/2009   Qualifier: Diagnosis of  By: Felicity Coyer MD, Valerie A    Muscle cramps 06/19/2016   Osteopenia 04/26/2016   2/18 - dexa - RFN and LFN  -1.2   OTITIS EXTERNA, RIGHT 01/20/2010   Qualifier: Diagnosis of  By: Felicity Coyer MD, Valerie A    Pre-diabetes    Prediabetes 04/24/2017   SCIATICA, LEFT 11/2010    Past Surgical History:  Procedure Laterality Date   BREAST BIOPSY Right    benign   BREAST LUMPECTOMY WITH RADIOACTIVE SEED LOCALIZATION Right 06/28/2021   Procedure: RIGHT BREAST LUMPECTOMY WITH RADIOACTIVE SEED LOCALIZATION;  Surgeon: Harriette Bouillon, MD;  Location: [facility];  Service: General;  Laterality: Right;   BREAST RECONSTRUCTION Right 07/05/2021   Procedure: BREAST RECONSTRUCTION;  Surgeon: Glenna Fellows, MD;  Location: [facility];  Service: Plastics;  Laterality: Right;   BREAST REDUCTION SURGERY Left 07/05/2021   Procedure: MAMMARY REDUCTION  (BREAST);  Surgeon: Glenna Fellows, MD;  Location: [facility];  Service: Plastics;  Laterality: Left;   COLONOSCOPY     ESOPHAGOGASTRODUODENOSCOPY     POLYPECTOMY     TUBAL LIGATION     UPPER GASTROINTESTINAL ENDOSCOPY      Prior to Admission medications   Medication Sig Start Date End Date Taking? Authorizing Provider  Alcohol Swabs (B-D SINGLE USE SWABS REGULAR) PADS USE AS DIRECTED 05/09/19  Yes Burns, Bobette Mo, MD  amLODipine (NORVASC) 10 MG tablet TAKE 1 TABLET EVERY DAY 03/07/22  Yes Burns, Bobette Mo, MD  aspirin 81 MG tablet Take 81 mg by mouth daily.   Yes [provider]  Cholecalciferol (VITAMIN D3) 1000 UNITS CAPS Take 1 capsule (1,000 Units total) by mouth daily. 02/23/14  Yes Newt Lukes, MD  irbesartan-hydrochlorothiazide (AVALIDE) 150-12.5 MG tablet TAKE 1 TABLET EVERY DAY 03/07/22  Yes Burns, Bobette Mo, MD  latanoprost (XALATAN) 0.005 % ophthalmic solution PLACE 1 DROP INTO BOTH EYES AT BEDTIME. 05/30/22  Yes Burns, Bobette Mo, MD  Multiple Vitamin (  MULTIVITAMIN) tablet Take 1 tablet by mouth daily.   Yes [provider]  potassium chloride SA (KLOR-CON M) 20 MEQ tablet TAKE 1 TABLET (20 MEQ TOTAL) BY MOUTH DAILY. 05/19/22  Yes Burns, Bobette Mo, MD  albuterol (VENTOLIN HFA) 108 (90 Base) MCG/ACT inhaler Inhale 2 puffs into the lungs every 6 (six) hours as needed for wheezing or shortness of breath. 12/22/19   Pincus Sanes, MD  ALPRAZolam Prudy Feeler) 0.5 MG tablet Take 1 tablet (0.5 mg total) by mouth 2 (two) times daily as needed for anxiety or sleep. 06/22/21   Pincus Sanes, MD  Ascorbic Acid (VITAMIN C) 1000 MG tablet Take 2,000 mg by mouth  daily. Patient not taking: Reported on 09/27/2022    [provider]  cyanocobalamin (VITAMIN B12) 100 MCG tablet     [provider]    Current Outpatient Medications  Medication Sig Dispense Refill   Alcohol Swabs (B-D SINGLE USE SWABS REGULAR) PADS USE AS DIRECTED 100 each 1   amLODipine (NORVASC) 10 MG tablet TAKE 1 TABLET EVERY DAY 90 tablet 3   aspirin 81 MG tablet Take 81 mg by mouth daily.     Cholecalciferol (VITAMIN D3) 1000 UNITS CAPS Take 1 capsule (1,000 Units total) by mouth daily. 30 capsule    irbesartan-hydrochlorothiazide (AVALIDE) 150-12.5 MG tablet TAKE 1 TABLET EVERY DAY 90 tablet 3   latanoprost (XALATAN) 0.005 % ophthalmic solution PLACE 1 DROP INTO BOTH EYES AT BEDTIME. 3 mL 0   Multiple Vitamin (MULTIVITAMIN) tablet Take 1 tablet by mouth daily.     potassium chloride SA (KLOR-CON M) 20 MEQ tablet TAKE 1 TABLET (20 MEQ TOTAL) BY MOUTH DAILY. 90 tablet 3   albuterol (VENTOLIN HFA) 108 (90 Base) MCG/ACT inhaler Inhale 2 puffs into the lungs every 6 (six) hours as needed for wheezing or shortness of breath. 18 g 5   ALPRAZolam (XANAX) 0.5 MG tablet Take 1 tablet (0.5 mg total) by mouth 2 (two) times daily as needed for anxiety or sleep. 30 tablet 0   Ascorbic Acid (VITAMIN C) 1000 MG tablet Take 2,000 mg by mouth daily. (Patient not taking: Reported on 09/27/2022)     cyanocobalamin (VITAMIN B12) 100 MCG tablet      Current Facility-Administered Medications  Medication Dose Route Frequency Provider Last Rate Last Admin   0.9 %  sodium chloride infusion  500 mL Intravenous Once Meryl Dare, MD        Allergies as of 09/27/2022 - Review Complete 09/27/2022  Allergen Reaction Noted   Hydrocodone bit-homatrop mbr Other (See Comments) 02/23/2014   Atorvastatin Other (See Comments) 06/30/2022   Losartan Palpitations 02/26/2015   Meloxicam Other (See Comments) 06/22/2021   Spironolactone Other (See Comments) 05/11/2020    Family History  Problem  Relation Age of Onset   Colon cancer Mother 96       Death result of a stroke   Arthritis Mother    Diabetes Mother    Colon polyps Mother    Arthritis Father    Alcohol abuse Brother    Lung cancer Brother    Prostate cancer Brother    Diabetes Brother    Hypertension Brother    Lung cancer Other        Neice   Esophageal cancer Neg Hx    Liver cancer Neg Hx    Pancreatic cancer Neg Hx    Rectal cancer Neg Hx    Stomach cancer Neg Hx     Social  History   Socioeconomic History   Marital status: Divorced    Spouse name: Not on file   Number of children: 2   Years of education: Not on file   Highest education level: Some college, no degree  Occupational History   Occupation: Retired  Tobacco Use   Smoking status: Never   Smokeless tobacco: Never  Vaping Use   Vaping status: Never Used  Substance and Sexual Activity   Alcohol use: No    Alcohol/week: 0.0 standard drinks of alcohol   Drug use: No   Sexual activity: Not Currently  Other Topics Concern   Not on file  Social History Narrative   Divorced, lives alone- in [place] for 23yrs but moved "home" to [place] in 2008      Walking regularly, every morning   Social Determinants of Health   Financial Resource Strain: Medium Risk (08/01/2022)   Overall Financial Resource Strain (CARDIA)    Difficulty of Paying Living Expenses: Somewhat hard  Food Insecurity: Food Insecurity Present (08/01/2022)   Hunger Vital Sign    Worried About Running Out of Food in the Last Year: Sometimes true    Ran Out of Food in the Last Year: Sometimes true  Transportation Needs: No Transportation Needs (08/01/2022)   PRAPARE - Administrator, Civil Service (Medical): No    Lack of Transportation (Non-Medical): No  Physical Activity: Sufficiently Active (08/01/2022)   Exercise Vital Sign    Days of Exercise per Week: 5 days    Minutes of Exercise per Session: 50 min  Stress: No Stress Concern Present (08/01/2022)   [employer] of Occupational Health - Occupational Stress Questionnaire    Feeling of Stress : Only a little  Social Connections: Moderately Integrated (08/01/2022)   Social Connection and Isolation Panel [NHANES]    Frequency of Communication with Friends and Family: More than three times a week    Frequency of Social Gatherings with Friends and Family: More than three times a week    Attends Religious Services: More than 4 times per year    Active Member of [employer] or Organizations: Yes    Attends Banker Meetings: More than 4 times per year    Marital Status: Divorced  Intimate Partner Violence: Not At Risk (04/03/2022)   Humiliation, Afraid, Rape, and Kick questionnaire    Fear of Current or Ex-Partner: No    Emotionally Abused: No    Physically Abused: No    Sexually Abused: No    Review of Systems:  All systems reviewed were negative except where noted in HPI.   Physical Exam:  General:  Alert, well-developed, in NAD Head:  Normocephalic and atraumatic. Eyes:  Sclera clear, no icterus.   Conjunctiva pink. Ears:  Normal auditory acuity. Mouth:  No deformity or lesions.  Neck:  Supple; no masses. Lungs:  Clear throughout to auscultation.   No wheezes, crackles, or rhonchi.  Heart:  Regular rate and rhythm; no murmurs. Abdomen:  Soft, nondistended, nontender. No masses, hepatomegaly. No palpable masses.  Normal bowel sounds.    Rectal:  Deferred   Msk:  Symmetrical without gross deformities. Extremities:  Without edema. Neurologic:  Alert and  oriented x 4; grossly normal neurologically. Skin:  Intact without significant lesions or rashes. Psych:  Alert and cooperative. Normal mood and affect.    Venita Lick. Russella Dar  09/27/2022, 9:36 AM See Loretha Stapler, [facility], to contact our on call provider

## 2022-09-28 ENCOUNTER — Telehealth: Payer: Self-pay

## 2022-09-28 NOTE — Telephone Encounter (Signed)
  Follow up Call-     09/27/2022    8:53 AM 09/27/2022    8:47 AM 02/13/2020    2:39 PM  Call back number  Post procedure Call Back phone  # [phone]  [phone]  Permission to leave phone message  Yes Yes     Patient questions:  Do you have a fever, pain , or abdominal swelling? No. Pain Score  0 *  Have you tolerated food without any problems? Yes.    Have you been able to return to your normal activities? Yes.    Do you have any questions about your discharge instructions: Diet   No. Medications  No. Follow up visit  No.  Do you have questions or concerns about your Care? No.  Actions: * If pain score is 4 or above: No action needed, pain <4.

## 2022-10-09 ENCOUNTER — Encounter: Payer: Self-pay | Admitting: Gastroenterology

## 2022-12-24 ENCOUNTER — Other Ambulatory Visit: Payer: Self-pay | Admitting: Internal Medicine

## 2023-01-02 ENCOUNTER — Encounter: Payer: Self-pay | Admitting: Internal Medicine

## 2023-01-02 NOTE — Progress Notes (Unsigned)
Subjective:    Patient ID: Shelley Hayes, female    DOB: 09/18/1945, 77 y.o.   MRN: [id number]     HPI Shelley Hayes is here for follow up of her chronic medical problems.  Ckd - new  Medications and allergies reviewed with patient and updated if appropriate.  Current Outpatient Medications on File Prior to Visit  Medication Sig Dispense Refill   albuterol (VENTOLIN HFA) 108 (90 Base) MCG/ACT inhaler Inhale 2 puffs into the lungs every 6 (six) hours as needed for wheezing or shortness of breath. 18 g 5   Alcohol Swabs (B-D SINGLE USE SWABS REGULAR) PADS USE AS DIRECTED 100 each 1   ALPRAZolam (XANAX) 0.5 MG tablet Take 1 tablet (0.5 mg total) by mouth 2 (two) times daily as needed for anxiety or sleep. 30 tablet 0   amLODipine (NORVASC) 10 MG tablet TAKE 1 TABLET EVERY DAY 90 tablet 3   Ascorbic Acid (VITAMIN C) 1000 MG tablet Take 2,000 mg by mouth daily. (Patient not taking: Reported on 09/27/2022)     aspirin 81 MG tablet Take 81 mg by mouth daily.     Cholecalciferol (VITAMIN D3) 1000 UNITS CAPS Take 1 capsule (1,000 Units total) by mouth daily. 30 capsule    cyanocobalamin (VITAMIN B12) 100 MCG tablet      irbesartan-hydrochlorothiazide (AVALIDE) 150-12.5 MG tablet TAKE 1 TABLET EVERY DAY 90 tablet 3   latanoprost (XALATAN) 0.005 % ophthalmic solution PLACE 1 DROP INTO BOTH EYES AT BEDTIME. 3 mL 0   Multiple Vitamin (MULTIVITAMIN) tablet Take 1 tablet by mouth daily.     potassium chloride SA (KLOR-CON M) 20 MEQ tablet TAKE 1 TABLET (20 MEQ TOTAL) BY MOUTH DAILY. 90 tablet 3   No current facility-administered medications on file prior to visit.     Review of Systems     Objective:  There were no vitals filed for this visit. BP Readings from Last 3 Encounters:  09/27/22 (!) 111/49  08/02/22 120/74  06/30/22 120/72   Wt Readings from Last 3 Encounters:  09/27/22 181 lb (82.1 kg)  08/29/22 181 lb (82.1 kg)  08/02/22 181 lb (82.1 kg)   There is no height or weight on  file to calculate BMI.    Physical Exam     Lab Results  Component Value Date   WBC 7.6 06/30/2022   HGB 13.9 06/30/2022   HCT 43.2 06/30/2022   PLT 383.0 06/30/2022   GLUCOSE 89 08/02/2022   CHOL 237 (H) 06/30/2022   TRIG 116.0 06/30/2022   HDL 41.50 06/30/2022   LDLDIRECT 142.0 06/22/2021   LDLCALC 173 (H) 06/30/2022   ALT 13 06/30/2022   AST 25 06/30/2022   NA 140 08/02/2022   K 4.0 08/02/2022   CL 101 08/02/2022   CREATININE 0.94 08/02/2022   BUN 13 08/02/2022   CO2 29 08/02/2022   TSH 1.05 06/30/2022   HGBA1C 5.3 08/02/2022     Assessment & Plan:    See Problem List for Assessment and Plan of chronic medical problems.

## 2023-01-02 NOTE — Patient Instructions (Addendum)
   Flu immunization administered today.     Blood work was ordered.   Have this done today.  Return to the lab in 2 months to recheck your cholesterol.     Consider getting a tetanus vaccine and shingles vaccine at your pharmacy.   Medications changes include :   zetia 10 mg daily     Return in about 6 months (around 07/04/2023) for Physical Exam, Schedule DEXA-[facility].

## 2023-01-03 ENCOUNTER — Ambulatory Visit: Payer: Medicare PPO | Admitting: Internal Medicine

## 2023-01-03 VITALS — BP 126/70 | HR 80 | Temp 98.0°F | Ht 59.0 in | Wt 181.0 lb

## 2023-01-03 DIAGNOSIS — F419 Anxiety disorder, unspecified: Secondary | ICD-10-CM | POA: Diagnosis not present

## 2023-01-03 DIAGNOSIS — I10 Essential (primary) hypertension: Secondary | ICD-10-CM | POA: Diagnosis not present

## 2023-01-03 DIAGNOSIS — Z23 Encounter for immunization: Secondary | ICD-10-CM

## 2023-01-03 DIAGNOSIS — H9193 Unspecified hearing loss, bilateral: Secondary | ICD-10-CM

## 2023-01-03 DIAGNOSIS — E782 Mixed hyperlipidemia: Secondary | ICD-10-CM

## 2023-01-03 DIAGNOSIS — N1831 Chronic kidney disease, stage 3a: Secondary | ICD-10-CM | POA: Diagnosis not present

## 2023-01-03 DIAGNOSIS — R252 Cramp and spasm: Secondary | ICD-10-CM

## 2023-01-03 DIAGNOSIS — M792 Neuralgia and neuritis, unspecified: Secondary | ICD-10-CM | POA: Insufficient documentation

## 2023-01-03 DIAGNOSIS — G5601 Carpal tunnel syndrome, right upper limb: Secondary | ICD-10-CM

## 2023-01-03 DIAGNOSIS — Z8673 Personal history of transient ischemic attack (TIA), and cerebral infarction without residual deficits: Secondary | ICD-10-CM | POA: Diagnosis not present

## 2023-01-03 DIAGNOSIS — R7303 Prediabetes: Secondary | ICD-10-CM | POA: Diagnosis not present

## 2023-01-03 DIAGNOSIS — M85861 Other specified disorders of bone density and structure, right lower leg: Secondary | ICD-10-CM | POA: Diagnosis not present

## 2023-01-03 LAB — HEMOGLOBIN A1C: Hgb A1c MFr Bld: 5.4 % (ref 4.6–6.5)

## 2023-01-03 LAB — LIPID PANEL
Cholesterol: 258 mg/dL — ABNORMAL HIGH (ref 0–200)
HDL: 47.8 mg/dL (ref >39.00)
LDL Cholesterol: 190 mg/dL — ABNORMAL HIGH (ref 0–99)
NonHDL: 209.87
Total CHOL/HDL Ratio: 5
Triglycerides: 97 mg/dL (ref 0.0–149.0)
VLDL: 19.4 mg/dL (ref 0.0–40.0)

## 2023-01-03 LAB — CBC WITH DIFFERENTIAL/PLATELET
Basophils Absolute: 0 10*3/uL (ref 0.0–0.1)
Basophils Relative: 0.3 % (ref 0.0–3.0)
Eosinophils Absolute: 0.1 10*3/uL (ref 0.0–0.7)
Eosinophils Relative: 1.4 % (ref 0.0–5.0)
HCT: 43.2 % (ref 36.0–46.0)
Hemoglobin: 13.7 g/dL (ref 12.0–15.0)
Lymphocytes Relative: 20.6 % (ref 12.0–46.0)
Lymphs Abs: 1.4 10*3/uL (ref 0.7–4.0)
MCHC: 31.8 g/dL (ref 30.0–36.0)
MCV: 87.3 fL (ref 78.0–100.0)
Monocytes Absolute: 0.6 10*3/uL (ref 0.1–1.0)
Monocytes Relative: 8.4 % (ref 3.0–12.0)
Neutro Abs: 4.8 10*3/uL (ref 1.4–7.7)
Neutrophils Relative %: 69.3 % (ref 43.0–77.0)
Platelets: 346 10*3/uL (ref 150.0–400.0)
RBC: 4.95 Mil/uL (ref 3.87–5.11)
RDW: 14.3 % (ref 11.5–15.5)
WBC: 7 10*3/uL (ref 4.0–10.5)

## 2023-01-03 LAB — HEPATIC FUNCTION PANEL
ALT: 11 U/L (ref 0–35)
AST: 22 U/L (ref 0–37)
Albumin: 4.4 g/dL (ref 3.5–5.2)
Alkaline Phosphatase: 61 U/L (ref 39–117)
Bilirubin, Direct: 0.1 mg/dL (ref 0.0–0.3)
Total Bilirubin: 0.5 mg/dL (ref 0.2–1.2)
Total Protein: 8.4 g/dL — ABNORMAL HIGH (ref 6.0–8.3)

## 2023-01-03 LAB — COMPREHENSIVE METABOLIC PANEL
ALT: 11 U/L (ref 0–35)
AST: 22 U/L (ref 0–37)
Albumin: 4.4 g/dL (ref 3.5–5.2)
Alkaline Phosphatase: 61 U/L (ref 39–117)
BUN: 16 mg/dL (ref 6–23)
CO2: 30 meq/L (ref 19–32)
Calcium: 9.8 mg/dL (ref 8.4–10.5)
Chloride: 103 meq/L (ref 96–112)
Creatinine, Ser: 1.12 mg/dL (ref 0.40–1.20)
GFR: 47.5 mL/min — ABNORMAL LOW (ref >60.00)
Glucose, Bld: 95 mg/dL (ref 70–99)
Potassium: 4.3 meq/L (ref 3.5–5.1)
Sodium: 141 meq/L (ref 135–145)
Total Bilirubin: 0.5 mg/dL (ref 0.2–1.2)
Total Protein: 8.4 g/dL — ABNORMAL HIGH (ref 6.0–8.3)

## 2023-01-03 MED ORDER — EZETIMIBE 10 MG PO TABS: 10.0000 mg | ORAL_TABLET | Freq: Every day | ORAL | 3 refills | Status: DC

## 2023-01-03 NOTE — Assessment & Plan Note (Signed)
Chronic Blood pressure controlled CMP Continue amlodipine 10 mg daily, Avalide 150-12.5 mg daily

## 2023-01-03 NOTE — Assessment & Plan Note (Signed)
Chronic DEXA due-ordered Continue regular walking Continue vitamin D, multivitamin

## 2023-01-03 NOTE — Assessment & Plan Note (Signed)
New Right hand intermittent numbness when she wakes Likely CTS Will try using a brace at night

## 2023-01-03 NOTE — Assessment & Plan Note (Signed)
Chronic Lab Results  Component Value Date   HGBA1C 5.3 08/02/2022    Sugars well-controlled Will check A1c today Regular exercise, low sugar/carbohydrate diet

## 2023-01-03 NOTE — Assessment & Plan Note (Signed)
States decreased hearing Has had her hearing checked in the past and was told it was normal Wanted referral to have her ears cleaned out-concerned about excessive wax No wax in her ears Discussed another referral to audiology to recheck her hearing-she deferred for now but will let me know if she changes her mind

## 2023-01-03 NOTE — Assessment & Plan Note (Signed)
Chronic Situational, Controlled Continue alprazolam 0.5 mg twice daily as needed

## 2023-01-03 NOTE — Assessment & Plan Note (Signed)
Chronic Encouraged weight loss Advised regular exercise-5 days a week for at least 30 minutes Discussed decreased portions, diet low in sugars and carbs-high in protein, vegetables and good fiber

## 2023-01-03 NOTE — Assessment & Plan Note (Signed)
Last 3 GFR readings have been low Likely related to NSAID use She has stopped all NSAIDs earlier this year Blood pressure, sugar well-controlled Advised good water intake CBC, CMP

## 2023-01-03 NOTE — Assessment & Plan Note (Signed)
Has cramping in feet Continue increased water intake Discussed other causes Cmp today Can eat yellow mustard, pickle juice prn

## 2023-01-03 NOTE — Assessment & Plan Note (Addendum)
Chronic Regular exercise and healthy diet encouraged Check lipid panel, CMP, TSH Currently not on a statin secondary to myalgias Discussed options-willing to try Zetia 10 mg daily

## 2023-01-03 NOTE — Assessment & Plan Note (Signed)
New Having some pain on the left side of her head but sounds more like neuralgia It is worse with eating at times-?  Related to TMJ

## 2023-01-03 NOTE — Assessment & Plan Note (Addendum)
Chronic History of CVA Continue aspirin 81 mg daily Has tried to statins and had muscle aches, cramping Start Zetia 10 mg daily Blood pressure well controlled Encouraged healthy diet, regular exercise

## 2023-01-07 NOTE — Addendum Note (Signed)
Addended by: Pincus Sanes on: 01/07/2023 10:18 AM   Modules accepted: Orders

## 2023-01-08 DIAGNOSIS — H52223 Regular astigmatism, bilateral: Secondary | ICD-10-CM | POA: Diagnosis not present

## 2023-01-08 DIAGNOSIS — H524 Presbyopia: Secondary | ICD-10-CM | POA: Diagnosis not present

## 2023-01-08 DIAGNOSIS — H5213 Myopia, bilateral: Secondary | ICD-10-CM | POA: Diagnosis not present

## 2023-01-08 DIAGNOSIS — H401234 Low-tension glaucoma, bilateral, indeterminate stage: Secondary | ICD-10-CM | POA: Diagnosis not present

## 2023-01-16 NOTE — Progress Notes (Unsigned)
Subjective:    Patient ID: Shelley Hayes, female    DOB: 1945/09/19, 77 y.o.   MRN: [id number]      HPI Shelley Hayes is here for No chief complaint on file.   She is here for an acute visit for cold symptoms.   Her symptoms started   She is experiencing   She has tried taking       Medications and allergies reviewed with patient and updated if appropriate.  Current Outpatient Medications on File Prior to Visit  Medication Sig Dispense Refill   albuterol (VENTOLIN HFA) 108 (90 Base) MCG/ACT inhaler Inhale 2 puffs into the lungs every 6 (six) hours as needed for wheezing or shortness of breath. 18 g 5   ALPRAZolam (XANAX) 0.5 MG tablet Take 1 tablet (0.5 mg total) by mouth 2 (two) times daily as needed for anxiety or sleep. 30 tablet 0   amLODipine (NORVASC) 10 MG tablet TAKE 1 TABLET EVERY DAY 90 tablet 3   aspirin 81 MG tablet Take 81 mg by mouth daily.     Cholecalciferol (VITAMIN D3) 1000 UNITS CAPS Take 1 capsule (1,000 Units total) by mouth daily. 30 capsule    cyanocobalamin (VITAMIN B12) 100 MCG tablet      ezetimibe (ZETIA) 10 MG tablet Take 1 tablet (10 mg total) by mouth daily. 90 tablet 3   irbesartan-hydrochlorothiazide (AVALIDE) 150-12.5 MG tablet TAKE 1 TABLET EVERY DAY 90 tablet 3   latanoprost (XALATAN) 0.005 % ophthalmic solution PLACE 1 DROP INTO BOTH EYES AT BEDTIME. 3 mL 0   Multiple Vitamin (MULTIVITAMIN) tablet Take 1 tablet by mouth daily.     potassium chloride SA (KLOR-CON M) 20 MEQ tablet TAKE 1 TABLET (20 MEQ TOTAL) BY MOUTH DAILY. 90 tablet 3   No current facility-administered medications on file prior to visit.    Review of Systems     Objective:  There were no vitals filed for this visit. BP Readings from Last 3 Encounters:  01/03/23 126/70  09/27/22 (!) 111/49  08/02/22 120/74   Wt Readings from Last 3 Encounters:  01/03/23 181 lb (82.1 kg)  09/27/22 181 lb (82.1 kg)  08/29/22 181 lb (82.1 kg)   There is no height or weight on file to  calculate BMI.    Physical Exam Constitutional:      General: She is not in acute distress.    Appearance: Normal appearance. She is not ill-appearing.  HENT:     Head: Normocephalic and atraumatic.     Right Ear: Tympanic membrane, ear canal and external ear normal.     Left Ear: Tympanic membrane, ear canal and external ear normal.     Mouth/Throat:     Mouth: Mucous membranes are moist.     Pharynx: No oropharyngeal exudate or posterior oropharyngeal erythema.  Eyes:     Conjunctiva/sclera: Conjunctivae normal.  Cardiovascular:     Rate and Rhythm: Normal rate and regular rhythm.  Pulmonary:     Effort: Pulmonary effort is normal. No respiratory distress.     Breath sounds: Normal breath sounds. No wheezing or rales.  Musculoskeletal:     Cervical back: Neck supple. No tenderness.  Lymphadenopathy:     Cervical: No cervical adenopathy.  Skin:    General: Skin is warm and dry.  Neurological:     Mental Status: She is alert.            Assessment & Plan:    See Problem List for Assessment and  Plan of chronic medical problems.

## 2023-01-16 NOTE — Patient Instructions (Addendum)
     Medications changes include :   Augmentin twice daily x 10 days      Return if symptoms worsen or fail to improve.

## 2023-01-17 ENCOUNTER — Encounter: Payer: Self-pay | Admitting: Internal Medicine

## 2023-01-17 ENCOUNTER — Ambulatory Visit (INDEPENDENT_AMBULATORY_CARE_PROVIDER_SITE_OTHER): Payer: Medicare PPO | Admitting: Internal Medicine

## 2023-01-17 VITALS — BP 140/80 | HR 87 | Temp 98.7°F | Ht 59.0 in | Wt 179.0 lb

## 2023-01-17 DIAGNOSIS — I10 Essential (primary) hypertension: Secondary | ICD-10-CM

## 2023-01-17 DIAGNOSIS — J209 Acute bronchitis, unspecified: Secondary | ICD-10-CM | POA: Insufficient documentation

## 2023-01-17 MED ORDER — AMOXICILLIN-POT CLAVULANATE 875-125 MG PO TABS: 1.0000 | ORAL_TABLET | Freq: Two times a day (BID) | ORAL | 0 refills | Status: DC

## 2023-01-17 NOTE — Assessment & Plan Note (Signed)
Acute Likely bacterial  Start Augmentin 875-125 mg BID x 10 day otc cold medications Rest, fluid Call if no improvement  

## 2023-01-17 NOTE — Assessment & Plan Note (Signed)
Chronic Blood pressure elevated here today, but typically much better-couple weeks ago her BP was 126/70 No change in medications since elevation is likely related to her being sick Continue amlodipine 10 mg daily, Avalide 150-12.5 mg daily

## 2023-01-25 ENCOUNTER — Inpatient Hospital Stay: Admission: RE | Admit: 2023-01-25 | Payer: Medicare PPO | Source: Ambulatory Visit

## 2023-02-01 IMAGING — MG MM BREAST SURGICAL SPECIMEN
1 series · 2 of 2 positions shown · non-contrast
Comparison: Previous exam(s).

CLINICAL DATA: Post lumpectomy specimen radiograph

EXAM:
SPECIMEN RADIOGRAPH OF THE RIGHT BREAST

[Series 1: R · right · 0.07mm/px · 2 of 2 slices shown]
[im 1/2]
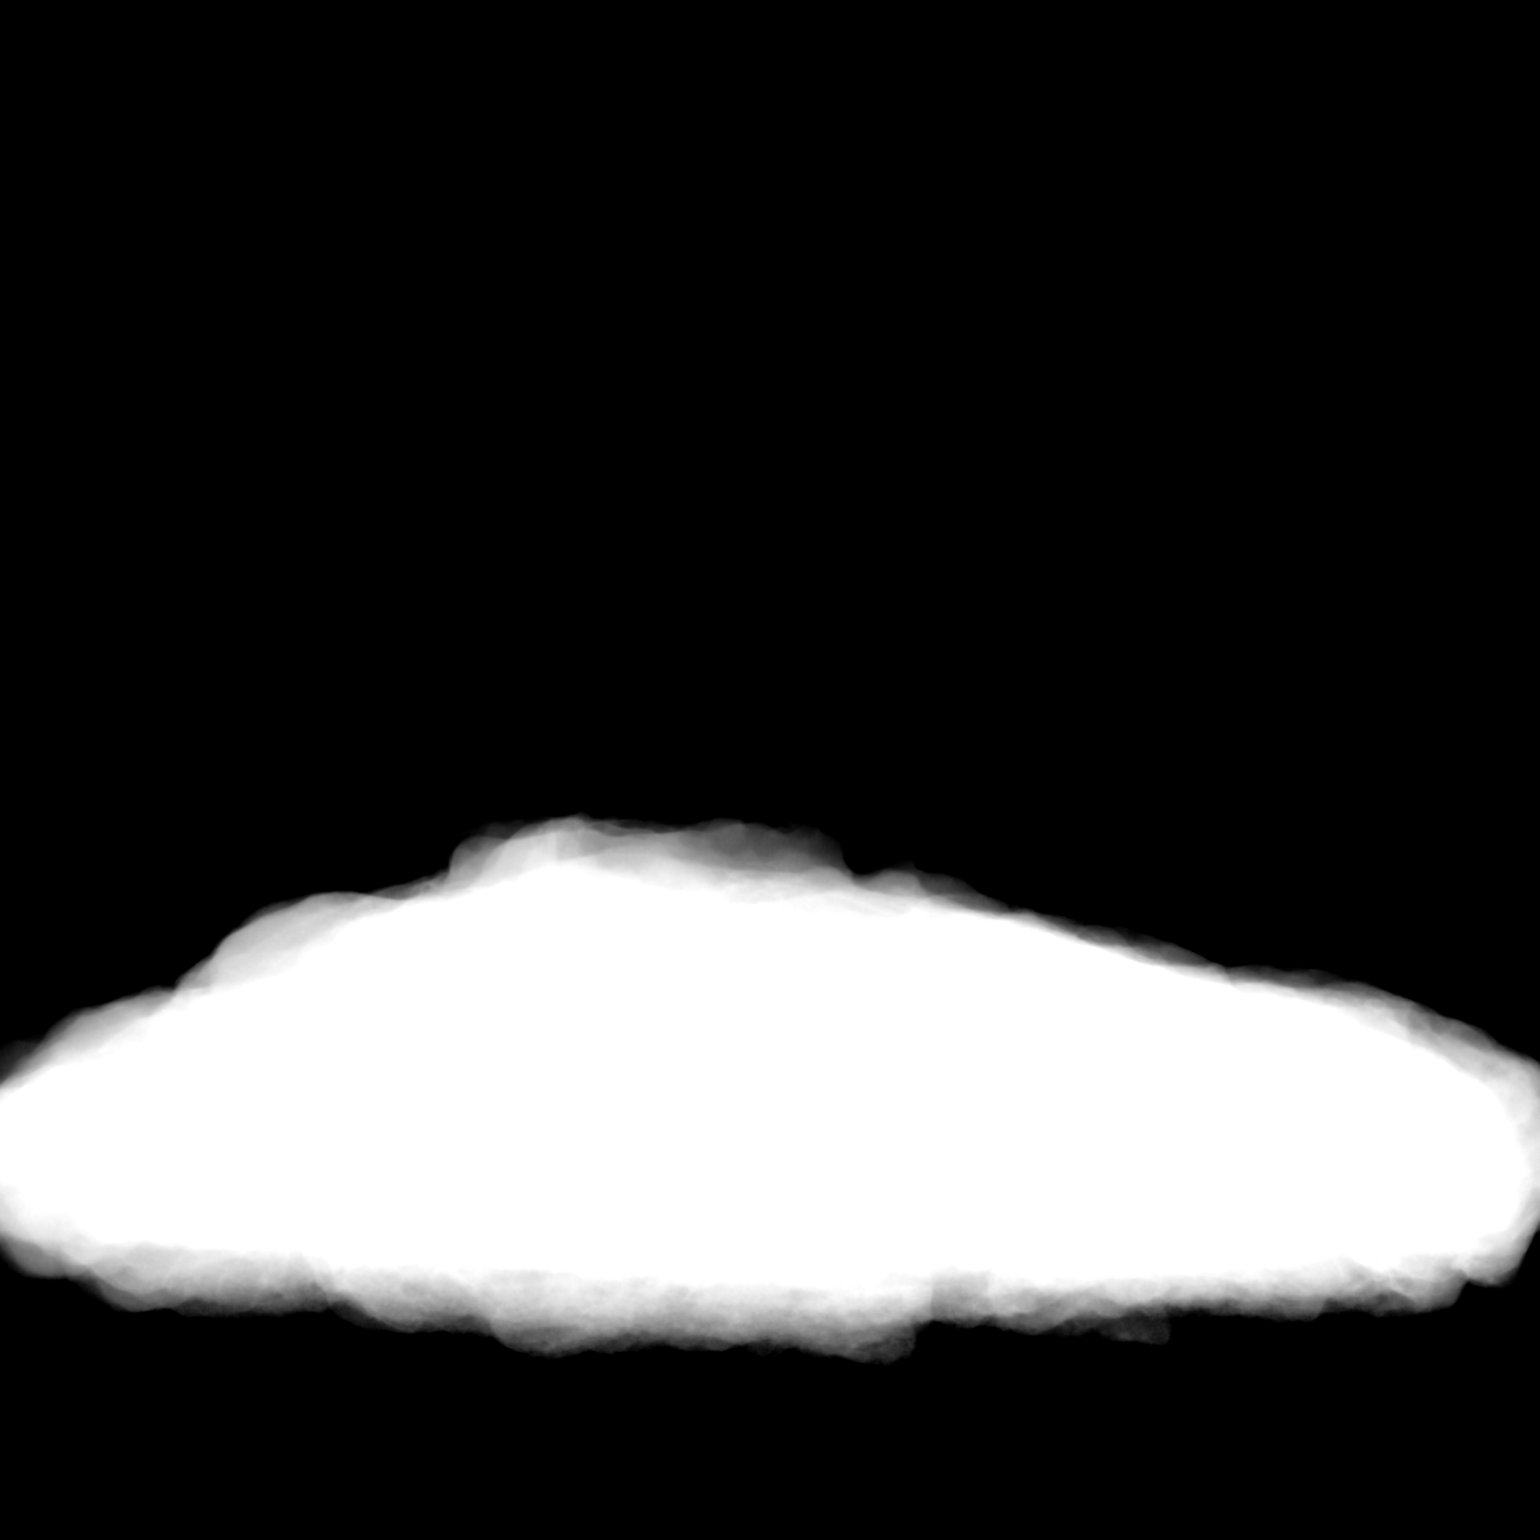
[im 2/2]
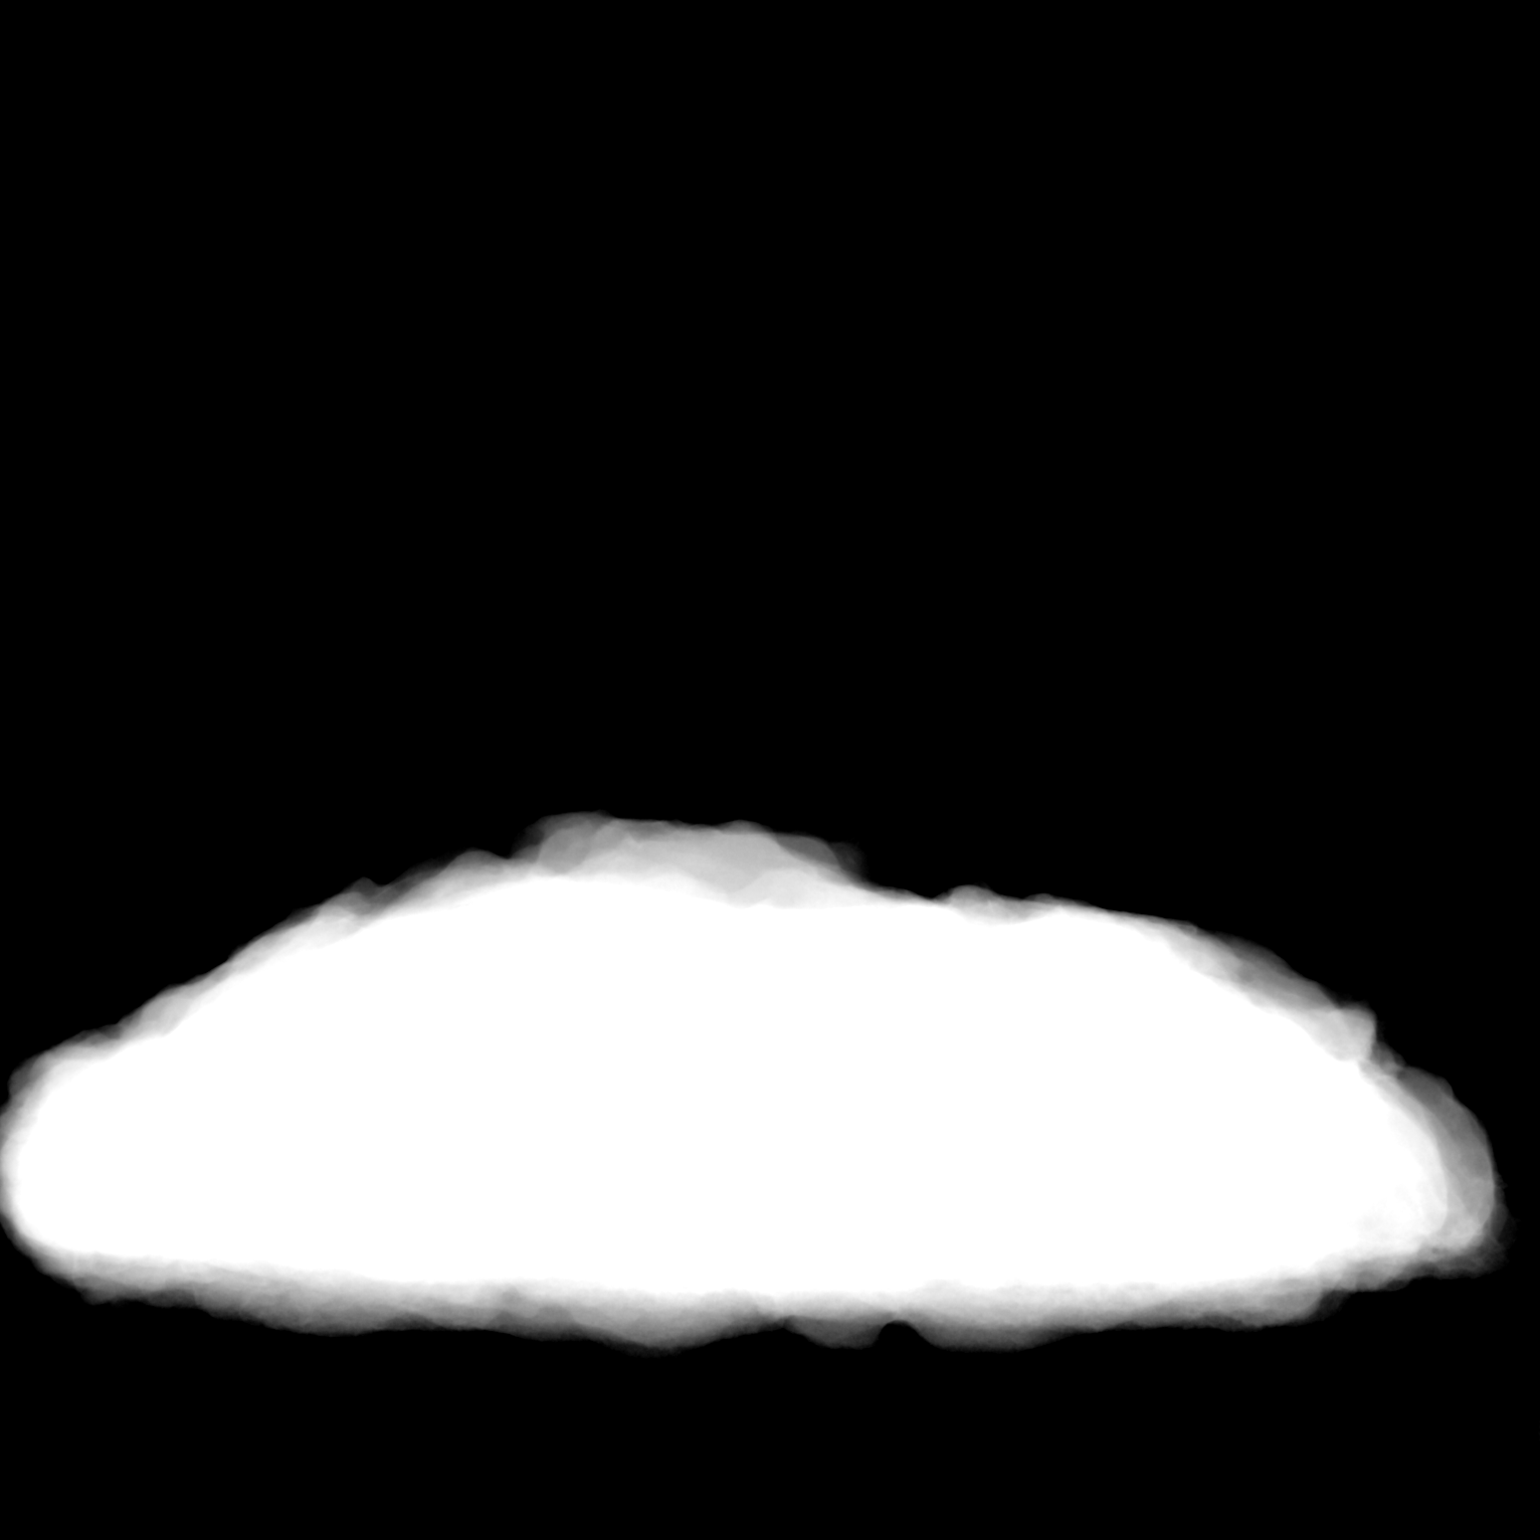

[2 of 2 positions shown; findings below may reference images not displayed]

FINDINGS: Status post excision of the right breast. The three radioactive
seeds, coil biopsy marker clip, and ribbon biopsy marker clip are
present, completely intact, and were marked for pathology.
IMPRESSION: Specimen radiograph of the right breast.

## 2023-02-02 ENCOUNTER — Encounter: Payer: Self-pay | Admitting: Internal Medicine

## 2023-02-02 ENCOUNTER — Ambulatory Visit (INDEPENDENT_AMBULATORY_CARE_PROVIDER_SITE_OTHER)
Admission: RE | Admit: 2023-02-02 | Discharge: 2023-02-02 | Disposition: A | Discharge status: Home / Self Care | Payer: Medicare PPO | Source: Ambulatory Visit | Attending: Internal Medicine

## 2023-02-02 DIAGNOSIS — M85862 Other specified disorders of bone density and structure, left lower leg: Secondary | ICD-10-CM

## 2023-02-02 DIAGNOSIS — M85861 Other specified disorders of bone density and structure, right lower leg: Secondary | ICD-10-CM

## 2023-02-14 DIAGNOSIS — Q6231 Congenital ureterocele, orthotopic: Secondary | ICD-10-CM | POA: Diagnosis not present

## 2023-02-14 DIAGNOSIS — D414 Neoplasm of uncertain behavior of bladder: Secondary | ICD-10-CM | POA: Diagnosis not present

## 2023-03-08 ENCOUNTER — Other Ambulatory Visit: Payer: Self-pay | Admitting: Internal Medicine

## 2023-04-10 ENCOUNTER — Ambulatory Visit (INDEPENDENT_AMBULATORY_CARE_PROVIDER_SITE_OTHER): Payer: Medicare PPO

## 2023-04-10 VITALS — Ht 59.0 in | Wt 179.0 lb

## 2023-04-10 DIAGNOSIS — Z0000 Encounter for general adult medical examination without abnormal findings: Secondary | ICD-10-CM | POA: Diagnosis not present

## 2023-04-10 NOTE — Progress Notes (Signed)
Subjective:   Shelley Hayes is a 78 y.o. female who presents for Medicare Annual (Subsequent) preventive examination.  Visit Complete: Virtual I connected with  Shelley Hayes on 04/10/23 by a video and audio enabled telemedicine application and verified that I am speaking with the correct person using two identifiers.  Patient Location: Home  Provider Location: Office/Clinic  I discussed the limitations of evaluation and management by telemedicine. The patient expressed understanding and agreed to proceed.  Vital Signs: Because this visit was a virtual/telehealth visit, some criteria may be missing or patient reported. Any vitals not documented were not able to be obtained and vitals that have been documented are patient reported.   Cardiac Risk Factors include: advanced age (>75men, >61 women);hypertension;Other (see comment), Risk factor comments: CKD     Objective:    Today's Vitals   04/10/23 1416  Weight: 179 lb (81.2 kg)  Height: 4\' 11"  (1.499 m)   Body mass index is 36.15 kg/m.     04/10/2023    3:46 PM 04/03/2022    9:25 AM 07/05/2021    6:35 AM 06/29/2021    3:01 PM 06/28/2021    9:37 AM 06/16/2021   11:01 AM 04/01/2021    8:46 AM  Advanced Directives  Does Patient Have a Medical Advance Directive? Yes Yes Yes Yes No Yes Yes  Type of Estate agent of [place];Living will Living will Healthcare Power of [place];Living will Healthcare Power of [place];Living will  Healthcare Power of Attorney Living will;Healthcare Power of Attorney  Does patient want to make changes to medical advance directive?  No - Patient declined No - Patient declined    No - Patient declined  Copy of Healthcare Power of Attorney in Chart? No - copy requested  No - copy requested   No - copy requested No - copy requested  Would patient like information on creating a medical advance directive?   No - Patient declined  No - Patient declined      Current Medications  (verified) Outpatient Encounter Medications as of 04/10/2023  Medication Sig   albuterol (VENTOLIN HFA) 108 (90 Base) MCG/ACT inhaler Inhale 2 puffs into the lungs every 6 (six) hours as needed for wheezing or shortness of breath.   ALPRAZolam (XANAX) 0.5 MG tablet Take 1 tablet (0.5 mg total) by mouth 2 (two) times daily as needed for anxiety or sleep.   amLODipine (NORVASC) 10 MG tablet TAKE 1 TABLET EVERY DAY   aspirin 81 MG tablet Take 81 mg by mouth daily.   atorvastatin (LIPITOR) 40 MG tablet TAKE 1 TABLET EVERY DAY   Cholecalciferol (VITAMIN D3) 1000 UNITS CAPS Take 1 capsule (1,000 Units total) by mouth daily.   cyanocobalamin (VITAMIN B12) 100 MCG tablet    ezetimibe (ZETIA) 10 MG tablet Take 1 tablet (10 mg total) by mouth daily.   hydrochlorothiazide (MICROZIDE) 12.5 MG capsule TAKE 1 CAPSULE EVERY DAY   irbesartan-hydrochlorothiazide (AVALIDE) 150-12.5 MG tablet TAKE 1 TABLET EVERY DAY   latanoprost (XALATAN) 0.005 % ophthalmic solution PLACE 1 DROP INTO BOTH EYES AT BEDTIME.   Multiple Vitamin (MULTIVITAMIN) tablet Take 1 tablet by mouth daily.   potassium chloride SA (KLOR-CON M) 20 MEQ tablet TAKE 1 TABLET EVERY DAY   amoxicillin-clavulanate (AUGMENTIN) 875-125 MG tablet Take 1 tablet by mouth 2 (two) times daily. (Patient not taking: Reported on 04/10/2023)   No facility-administered encounter medications on file as of 04/10/2023.    Allergies (verified) Hydrocodone bit-homatrop mbr, Atorvastatin, Losartan,  Meloxicam, and Spironolactone   History: Past Medical History:  Diagnosis Date   ALLERGIC RHINITIS    Allergic rhinitis 08/23/2009   Allergy    Anxiety    ARTHRITIS    Arthritis    Blood in stool 04/24/2017   Cancer (HCC) 05/2021   right breast DCIS   Cataract    bil cataracts removes   Cervical paraspinal muscle spasm 12/23/2015   Chest pain 04/24/2017   CHEST PAIN 12/19/2009   Qualifier: Diagnosis of  By: Charlsie Quest RMA, Lucy     COLONIC POLYPS, HX OF 08/23/2009    Qualifier: Diagnosis of  By: Charlsie Quest RMA, Lucy     Cough 06/19/2016   Fluttering heart 12/23/2015   GERD (gastroesophageal reflux disease)    Glaucoma    GLUCOMA    Heart murmur    Herpes zoster without complication 04/24/2017   Hyperglycemia 06/19/2016   HYPERLIPIDEMIA    HYPERTENSION    KNEE PAIN 08/23/2009   Qualifier: Diagnosis of  By: Felicity Coyer MD, Valerie A    Muscle cramps 06/19/2016   Osteopenia 04/26/2016   2/18 - dexa - RFN and LFN  -1.2   OTITIS EXTERNA, RIGHT 01/20/2010   Qualifier: Diagnosis of  By: Felicity Coyer MD, Valerie A    Pre-diabetes    Prediabetes 04/24/2017   SCIATICA, LEFT 11/2010   Past Surgical History:  Procedure Laterality Date   BREAST BIOPSY Right    benign   BREAST LUMPECTOMY WITH RADIOACTIVE SEED LOCALIZATION Right 06/28/2021   Procedure: RIGHT BREAST LUMPECTOMY WITH RADIOACTIVE SEED LOCALIZATION;  Surgeon: Harriette Bouillon, MD;  Location: [facility];  Service: General;  Laterality: Right;   BREAST RECONSTRUCTION Right 07/05/2021   Procedure: BREAST RECONSTRUCTION;  Surgeon: Glenna Fellows, MD;  Location: [facility];  Service: Plastics;  Laterality: Right;   BREAST REDUCTION SURGERY Left 07/05/2021   Procedure: MAMMARY REDUCTION  (BREAST);  Surgeon: Glenna Fellows, MD;  Location: [facility];  Service: Plastics;  Laterality: Left;   COLONOSCOPY     ESOPHAGOGASTRODUODENOSCOPY     POLYPECTOMY     TUBAL LIGATION     UPPER GASTROINTESTINAL ENDOSCOPY     Family History  Problem Relation Age of Onset   Colon cancer Mother 69       Death result of a stroke   Arthritis Mother    Diabetes Mother    Colon polyps Mother    Arthritis Father    Alcohol abuse Brother    Lung cancer Brother    Prostate cancer Brother    Diabetes Brother    Hypertension Brother    Lung cancer Other        Neice   Esophageal cancer Neg Hx    Liver cancer Neg Hx    Pancreatic cancer Neg Hx    Rectal cancer Neg Hx     Stomach cancer Neg Hx    Social History   Socioeconomic History   Marital status: Divorced    Spouse name: Not on file   Number of children: 2   Years of education: Not on file   Highest education level: Some college, no degree  Occupational History   Occupation: Retired  Tobacco Use   Smoking status: Never   Smokeless tobacco: Never  Vaping Use   Vaping status: Never Used  Substance and Sexual Activity   Alcohol use: No    Alcohol/week: 0.0 standard drinks of alcohol   Drug use: No   Sexual activity: Not Currently  Other  Topics Concern   Not on file  Social History Narrative   Divorced, lives alone- in [place] for [place]yrs but moved "home" to [place] in 2008      Walking regularly, every morning   Social Drivers of Health   Financial Resource Strain: Low Risk  (04/10/2023)   Overall Financial Resource Strain (CARDIA)    Difficulty of Paying Living Expenses: Not very hard  Food Insecurity: No Food Insecurity (04/10/2023)   Hunger Vital Sign    Worried About Running Out of Food in the Last Year: Never true    Ran Out of Food in the Last Year: Never true  Transportation Needs: No Transportation Needs (04/10/2023)   PRAPARE - Administrator, Civil Service (Medical): No    Lack of Transportation (Non-Medical): No  Physical Activity: Sufficiently Active (04/10/2023)   Exercise Vital Sign    Days of Exercise per Week: 7 days    Minutes of Exercise per Session: 40 min  Stress: Stress Concern Present (04/10/2023)   [employer] of Occupational Health - Occupational Stress Questionnaire    Feeling of Stress : To some extent  Social Connections: Moderately Integrated (04/10/2023)   Social Connection and Isolation Panel [NHANES]    Frequency of Communication with Friends and Family: More than three times a week    Frequency of Social Gatherings with Friends and Family: More than three times a week    Attends Religious Services: More than 4 times per year    Active Member  of [employer] or Organizations: Yes    Attends Banker Meetings: Never    Marital Status: Divorced    Tobacco Counseling Counseling given: Not Answered   Clinical Intake:  Pre-visit preparation completed: Yes  Pain : No/denies pain     BMI - recorded: 36.15 Nutritional Status: BMI > 30  Obese Nutritional Risks: None Diabetes: No  How often do you need to have someone help you when you read instructions, pamphlets, or other written materials from your doctor or pharmacy?: 1 - Never     Information entered by :: Mylee Falin, RMA   Activities of Daily Living    04/10/2023    2:16 PM  In your present state of health, do you have any difficulty performing the following activities:  Hearing? 0  Vision? 0  Difficulty concentrating or making decisions? 0  Walking or climbing stairs? 0  Dressing or bathing? 0  Doing errands, shopping? 0  Preparing Food and eating ? N  Using the Toilet? N  In the past six months, have you accidently leaked urine? N  Do you have problems with loss of bowel control? N  Managing your Medications? N  Managing your Finances? N  Housekeeping or managing your Housekeeping? N    Patient Care Team: Pincus Sanes, MD as PCP - General (Internal Medicine) Wendall Stade, MD as PCP - Cardiology (Cardiology) Harold Hedge, MD (Obstetrics and Gynecology) Velda Shell, MD as Referring Physician (Optometry) Szabat, Vinnie Level, [facility] (Inactive) (Pharmacist)  Indicate any recent Medical Services you may have received from other than [facility] providers in the past year (date may be approximate).     Assessment:   This is a routine wellness examination for [place].  Hearing/Vision screen Hearing Screening - Comments:: Denies hearing difficulties ' Vision Screening - Comments:: Wears eyeglasses   Goals Addressed               This Visit's Progress     Weight (  lb) < 175 lb (79.4 kg) (pt-stated)   179 lb (81.2 kg)     Would like to lose 20  lbs by increasing activity (joining [employer]) and continuing health food choices. On Track-2025      Depression Screen    04/10/2023    3:52 PM 08/02/2022    9:05 AM 06/30/2022    1:32 PM 04/03/2022    9:27 AM 04/03/2022    9:24 AM 06/22/2021    9:43 AM 04/01/2021    9:03 AM  PHQ 2/9 Scores  PHQ - 2 Score 0 0 0 0 0 6 0  PHQ- 9 Score 1 1 2   9      Fall Risk    04/10/2023    3:47 PM 08/02/2022    8:22 AM 06/30/2022    1:32 PM 04/03/2022    9:26 AM 06/22/2021    9:43 AM  Fall Risk   Falls in the past year? 1 0 0 1 1  Number falls in past yr: 1 0 0 1 1  Injury with Fall? 0 0 0 0 0  Risk for fall due to :  No Fall Risks No Fall Risks History of fall(s) No Fall Risks  Follow up Falls evaluation completed;Falls prevention discussed Falls evaluation completed Falls evaluation completed Falls evaluation completed Falls evaluation completed    MEDICARE RISK AT HOME: Medicare Risk at Home Any stairs in or around the home?: No Home free of loose throw rugs in walkways, pet beds, electrical cords, etc?: Yes Adequate lighting in your home to reduce risk of falls?: Yes Life alert?: Yes (and a watch) Use of a cane, walker or w/c?: Yes (uses cane sometimes) Grab bars in the bathroom?: No Shower chair or bench in shower?: Yes Elevated toilet seat or a handicapped toilet?: Yes  TIMED UP AND GO:  Was the test performed?  No    Cognitive Function:        04/10/2023    2:18 PM 04/03/2022    9:30 AM  6CIT Screen  What Year? 0 points 0 points  What month? 0 points 0 points  What time? 0 points 0 points  Count back from 20 0 points 0 points  Months in reverse 0 points 0 points  Repeat phrase 2 points 4 points  Total Score 2 points 4 points    Immunizations Immunization History  Administered Date(s) Administered   Fluad Quad(high Dose 65+) 12/16/2018, 12/23/2019, 12/22/2020, 12/28/2021   Fluad Trivalent(High Dose 65+) 01/03/2023   Influenza Whole 01/20/2010   Influenza, High  Dose Seasonal PF 12/30/2012, 02/26/2015, 02/29/2016, 02/06/2017, 03/27/2018   Influenza,inj,Quad PF,6+ Mos 02/23/2014   Janssen (J&J) SARS-COV-2 Vaccination 06/13/2019   Pneumococcal Conjugate-13 02/29/2016   Pneumococcal Polysaccharide-23 02/23/2014   Tetanus 10/25/2011    TDAP status: Due, Education has been provided regarding the importance of this vaccine. Advised may receive this vaccine at local pharmacy or Health Dept. Aware to provide a copy of the vaccination record if obtained from local pharmacy or Health Dept. Verbalized acceptance and understanding.  Flu Vaccine status: Up to date  Pneumococcal vaccine status: Up to date  Covid-19 vaccine status: Declined, Education has been provided regarding the importance of this vaccine but patient still declined. Advised may receive this vaccine at local pharmacy or Health Dept.or vaccine clinic. Aware to provide a copy of the vaccination record if obtained from local pharmacy or Health Dept. Verbalized acceptance and understanding.  Qualifies for Shingles Vaccine? Yes   Zostavax completed No  Shingrix Completed?: No.    Education has been provided regarding the importance of this vaccine. Patient has been advised to call insurance company to determine out of pocket expense if they have not yet received this vaccine. Advised may also receive vaccine at local pharmacy or Health Dept. Verbalized acceptance and understanding.  Screening Tests Health Maintenance  Topic Date Due   Zoster Vaccines- Shingrix (1 of 2) Never done   DTaP/Tdap/Td (1 - Tdap) 10/26/2011   Colonoscopy  09/27/2023   Medicare Annual Wellness (AWV)  04/09/2024   DEXA SCAN  02/01/2026   Pneumonia Vaccine 57+ Years old  Completed   INFLUENZA VACCINE  Completed   Hepatitis C Screening  Completed   HPV VACCINES  Aged Out   COVID-19 Vaccine  Discontinued    Health Maintenance  Health Maintenance Due  Topic Date Due   Zoster Vaccines- Shingrix (1 of 2) Never done    DTaP/Tdap/Td (1 - Tdap) 10/26/2011    Colorectal cancer screening: Type of screening: Colonoscopy. Completed 09/27/2022. Repeat every 1 years  Mammogram status: Completed 05/08/20224. Repeat every year  Bone Density status: Completed 02/02/2023. Results reflect: Bone density results: OSTEOPOROSIS. Repeat every 2 years.  Lung Cancer Screening: (Low Dose CT Chest recommended if Age 17-80 years, 20 pack-year currently smoking OR have quit w/in 15years.) does not qualify.   Lung Cancer Screening Referral: N/A  Additional Screening:  Hepatitis C Screening: does qualify; Completed 02/26/2015  Vision Screening: Recommended annual ophthalmology exams for early detection of glaucoma and other disorders of the eye. Is the patient up to date with their annual eye exam?  Yes  Who is the provider or what is the name of the office in which the patient attends annual eye exams? [facility] [place], [place]) If pt is not established with a provider, would they like to be referred to a provider to establish care? No .   Dental Screening: Recommended annual dental exams for proper oral hygiene  Community Resource Referral / Chronic Care Management: CRR required this visit?  No   CCM required this visit?  No     Plan:     I have personally reviewed and noted the following in the patient's chart:   Medical and social history Use of alcohol, tobacco or illicit drugs  Current medications and supplements including opioid prescriptions. Patient is not currently taking opioid prescriptions. Functional ability and status Nutritional status Physical activity Advanced directives List of other physicians Hospitalizations, surgeries, and ER visits in previous 12 months Vitals Screenings to include cognitive, depression, and falls Referrals and appointments  In addition, I have reviewed and discussed with patient certain preventive protocols, quality metrics, and best practice  recommendations. A written personalized care plan for preventive services as well as general preventive health recommendations were provided to patient.     Koji Niehoff L Atonya Templer, CMA   04/10/2023   After Visit Summary: (MyChart) Due to this being a telephonic visit, the after visit summary with patients personalized plan was offered to patient via MyChart   Nurse Notes: Patient is due for a Tdap.  She declines the Shingrix vaccine.  Patient is up to date with all other health maintenance with no concerns to address today.

## 2023-04-10 NOTE — Patient Instructions (Addendum)
Shelley Hayes , Thank you for taking time to come for your Medicare Wellness Visit. I appreciate your ongoing commitment to your health goals. Please review the following plan we discussed and let me know if I can assist you in the future.   Referrals/Orders/Follow-Ups/Clinician Recommendations: It was nice talking with you today and congratulations again on graduating from [employer].  You are due for a Tetanus vaccine.  Keep up the good work.    This is a list of the screening recommended for you and due dates:  Health Maintenance  Topic Date Due   Zoster (Shingles) Vaccine (1 of 2) Never done   DTaP/Tdap/Td vaccine (1 - Tdap) 10/26/2011   Colon Cancer Screening  09/27/2023   Medicare Annual Wellness Visit  04/09/2024   DEXA scan (bone density measurement)  02/01/2026   Pneumonia Vaccine  Completed   Flu Shot  Completed   Hepatitis C Screening  Completed   HPV Vaccine  Aged Out   COVID-19 Vaccine  Discontinued    Advanced directives: (Copy Requested) Please bring a copy of your health care power of attorney and living will to the office to be added to your chart at your convenience.  Next Medicare Annual Wellness Visit scheduled for next year: Yes

## 2023-04-17 DIAGNOSIS — D414 Neoplasm of uncertain behavior of bladder: Secondary | ICD-10-CM | POA: Diagnosis not present

## 2023-05-09 DIAGNOSIS — N281 Cyst of kidney, acquired: Secondary | ICD-10-CM | POA: Diagnosis not present

## 2023-05-09 DIAGNOSIS — N134 Hydroureter: Secondary | ICD-10-CM | POA: Diagnosis not present

## 2023-05-09 DIAGNOSIS — D414 Neoplasm of uncertain behavior of bladder: Secondary | ICD-10-CM | POA: Diagnosis not present

## 2023-05-09 DIAGNOSIS — Q6231 Congenital ureterocele, orthotopic: Secondary | ICD-10-CM | POA: Diagnosis not present

## 2023-05-14 DIAGNOSIS — H401234 Low-tension glaucoma, bilateral, indeterminate stage: Secondary | ICD-10-CM | POA: Diagnosis not present

## 2023-05-28 DIAGNOSIS — R8289 Other abnormal findings on cytological and histological examination of urine: Secondary | ICD-10-CM | POA: Diagnosis not present

## 2023-05-28 DIAGNOSIS — Q6231 Congenital ureterocele, orthotopic: Secondary | ICD-10-CM | POA: Diagnosis not present

## 2023-05-28 DIAGNOSIS — N281 Cyst of kidney, acquired: Secondary | ICD-10-CM | POA: Diagnosis not present

## 2023-06-21 ENCOUNTER — Encounter: Payer: Self-pay | Admitting: Obstetrics and Gynecology

## 2023-06-21 DIAGNOSIS — Z9889 Other specified postprocedural states: Secondary | ICD-10-CM

## 2023-07-02 ENCOUNTER — Other Ambulatory Visit: Payer: Self-pay | Admitting: Internal Medicine

## 2023-07-02 DIAGNOSIS — Z9889 Other specified postprocedural states: Secondary | ICD-10-CM

## 2023-07-03 NOTE — Progress Notes (Unsigned)
 Subjective:    Patient ID: Shelley Hayes, female    DOB: 09-Oct-1945, 78 y.o.   MRN: [id number]     HPI Shelley Hayes is here for follow up of her chronic medical problems.  Had DEXA scan done last fall-has osteoporosis  Balance is poor.  She is not walking daily anymore because of her poor balance, she has fallen 6 times this year.  She denies any lightheadedness or dizziness.  She denies any numbness or tingling.  She just feels like she loses her balance.  Every evening she gets so tired - before.  She forces herself through it.     Having pain in sharp pain in left medial chest that lasts for a few seconds and feels it in the lateral left breast.  She does have a diagnostic mammogram scheduled.   Medications and allergies reviewed with patient and updated if appropriate.  Current Outpatient Medications on File Prior to Visit  Medication Sig Dispense Refill   albuterol  (VENTOLIN  HFA) 108 (90 Base) MCG/ACT inhaler Inhale 2 puffs into the lungs every 6 (six) hours as needed for wheezing or shortness of breath. 18 g 5   ALPRAZolam  (XANAX ) 0.5 MG tablet Take 1 tablet (0.5 mg total) by mouth 2 (two) times daily as needed for anxiety or sleep. 30 tablet 0   amLODipine  (NORVASC ) 10 MG tablet TAKE 1 TABLET EVERY DAY 90 tablet 3   aspirin 81 MG tablet Take 81 mg by mouth daily.     Cholecalciferol (VITAMIN D3) 1000 UNITS CAPS Take 1 capsule (1,000 Units total) by mouth daily. 30 capsule    cyanocobalamin  (VITAMIN B12) 100 MCG tablet      ezetimibe  (ZETIA ) 10 MG tablet Take 1 tablet (10 mg total) by mouth daily. 90 tablet 3   hydrochlorothiazide  (MICROZIDE ) 12.5 MG capsule TAKE 1 CAPSULE EVERY DAY 90 capsule 3   irbesartan -hydrochlorothiazide  (AVALIDE) 150-12.5 MG tablet TAKE 1 TABLET EVERY DAY 90 tablet 3   latanoprost  (XALATAN ) 0.005 % ophthalmic solution PLACE 1 DROP INTO BOTH EYES AT BEDTIME. 3 mL 0   Multiple Vitamin (MULTIVITAMIN) tablet Take 1 tablet by mouth daily.     No current  facility-administered medications on file prior to visit.     Review of Systems  Constitutional:  Negative for fever.  Respiratory:  Negative for cough, shortness of breath and wheezing.   Cardiovascular:  Positive for chest pain. Negative for palpitations and leg swelling.  Musculoskeletal:  Positive for arthralgias (right knee). Negative for back pain.       Muscle cramping foot and lower leg - only one leg - alternates legs  Neurological:  Negative for dizziness, light-headedness, numbness and headaches.       Objective:   Vitals:   07/04/23 0943  BP: 118/80  Pulse: 68  Temp: 98.2 F (36.8 C)  SpO2: 100%   BP Readings from Last 3 Encounters:  07/04/23 118/80  01/17/23 (!) 140/80  01/03/23 126/70   Wt Readings from Last 3 Encounters:  07/04/23 184 lb (83.5 kg)  04/10/23 179 lb (81.2 kg)  01/17/23 179 lb (81.2 kg)   Body mass index is 37.16 kg/m.    Physical Exam Constitutional:      General: She is not in acute distress.    Appearance: Normal appearance.  HENT:     Head: Normocephalic and atraumatic.  Eyes:     Conjunctiva/sclera: Conjunctivae normal.  Cardiovascular:     Rate and Rhythm: Normal rate and regular  rhythm.     Heart sounds: Normal heart sounds.  Pulmonary:     Effort: Pulmonary effort is normal. No respiratory distress.     Breath sounds: Normal breath sounds. No wheezing.  Musculoskeletal:     Cervical back: Neck supple.     Right lower leg: No edema.     Left lower leg: No edema.  Lymphadenopathy:     Cervical: No cervical adenopathy.  Skin:    General: Skin is warm and dry.     Findings: No rash.  Neurological:     Mental Status: She is alert. Mental status is at baseline.  Psychiatric:        Mood and Affect: Mood normal.        Behavior: Behavior normal.        Lab Results  Component Value Date   WBC 7.0 01/03/2023   HGB 13.7 01/03/2023   HCT 43.2 01/03/2023   PLT 346.0 01/03/2023   GLUCOSE 95 01/03/2023   CHOL 258 (H)  01/03/2023   TRIG 97.0 01/03/2023   HDL 47.80 01/03/2023   LDLDIRECT 142.0 06/22/2021   LDLCALC 190 (H) 01/03/2023   ALT 11 01/03/2023   ALT 11 01/03/2023   AST 22 01/03/2023   AST 22 01/03/2023   NA 141 01/03/2023   K 4.3 01/03/2023   CL 103 01/03/2023   CREATININE 1.12 01/03/2023   BUN 16 01/03/2023   CO2 30 01/03/2023   TSH 1.05 06/30/2022   HGBA1C 5.4 01/03/2023     Assessment & Plan:    See Problem List for Assessment and Plan of chronic medical problems.

## 2023-07-03 NOTE — Patient Instructions (Addendum)
      Blood work was ordered.       Medications changes include :   None    A referral was ordered and someone will call you to schedule an appointment.     Return in about 6 months (around 01/03/2024) for Physical Exam.

## 2023-07-04 ENCOUNTER — Encounter: Payer: Self-pay | Admitting: Internal Medicine

## 2023-07-04 ENCOUNTER — Ambulatory Visit (INDEPENDENT_AMBULATORY_CARE_PROVIDER_SITE_OTHER): Payer: Medicare PPO | Admitting: Internal Medicine

## 2023-07-04 VITALS — BP 118/80 | HR 68 | Temp 98.2°F | Ht 59.0 in | Wt 184.0 lb

## 2023-07-04 DIAGNOSIS — M810 Age-related osteoporosis without current pathological fracture: Secondary | ICD-10-CM | POA: Diagnosis not present

## 2023-07-04 DIAGNOSIS — I10 Essential (primary) hypertension: Secondary | ICD-10-CM | POA: Diagnosis not present

## 2023-07-04 DIAGNOSIS — Z8673 Personal history of transient ischemic attack (TIA), and cerebral infarction without residual deficits: Secondary | ICD-10-CM | POA: Diagnosis not present

## 2023-07-04 DIAGNOSIS — R7303 Prediabetes: Secondary | ICD-10-CM | POA: Diagnosis not present

## 2023-07-04 DIAGNOSIS — R252 Cramp and spasm: Secondary | ICD-10-CM | POA: Diagnosis not present

## 2023-07-04 DIAGNOSIS — R296 Repeated falls: Secondary | ICD-10-CM

## 2023-07-04 DIAGNOSIS — E876 Hypokalemia: Secondary | ICD-10-CM | POA: Diagnosis not present

## 2023-07-04 DIAGNOSIS — F419 Anxiety disorder, unspecified: Secondary | ICD-10-CM

## 2023-07-04 DIAGNOSIS — R2689 Other abnormalities of gait and mobility: Secondary | ICD-10-CM | POA: Insufficient documentation

## 2023-07-04 DIAGNOSIS — E782 Mixed hyperlipidemia: Secondary | ICD-10-CM

## 2023-07-04 DIAGNOSIS — N1831 Chronic kidney disease, stage 3a: Secondary | ICD-10-CM | POA: Diagnosis not present

## 2023-07-04 LAB — COMPREHENSIVE METABOLIC PANEL WITH GFR
ALT: 15 U/L (ref 0–35)
AST: 24 U/L (ref 0–37)
Albumin: 4.3 g/dL (ref 3.5–5.2)
Alkaline Phosphatase: 67 U/L (ref 39–117)
BUN: 16 mg/dL (ref 6–23)
CO2: 31 meq/L (ref 19–32)
Calcium: 10 mg/dL (ref 8.4–10.5)
Chloride: 104 meq/L (ref 96–112)
Creatinine, Ser: 0.98 mg/dL (ref 0.40–1.20)
GFR: 55.56 mL/min — ABNORMAL LOW (ref >60.00)
Glucose, Bld: 95 mg/dL (ref 70–99)
Potassium: 4.2 meq/L (ref 3.5–5.1)
Sodium: 142 meq/L (ref 135–145)
Total Bilirubin: 0.5 mg/dL (ref 0.2–1.2)
Total Protein: 8.1 g/dL (ref 6.0–8.3)

## 2023-07-04 LAB — LIPID PANEL
Cholesterol: 221 mg/dL — ABNORMAL HIGH (ref 0–200)
HDL: 45.6 mg/dL (ref >39.00)
LDL Cholesterol: 141 mg/dL — ABNORMAL HIGH (ref 0–99)
NonHDL: 175.29
Total CHOL/HDL Ratio: 5
Triglycerides: 171 mg/dL — ABNORMAL HIGH (ref 0.0–149.0)
VLDL: 34.2 mg/dL (ref 0.0–40.0)

## 2023-07-04 LAB — CBC WITH DIFFERENTIAL/PLATELET
Basophils Absolute: 0 10*3/uL (ref 0.0–0.1)
Basophils Relative: 0.5 % (ref 0.0–3.0)
Eosinophils Absolute: 0.1 10*3/uL (ref 0.0–0.7)
Eosinophils Relative: 1.9 % (ref 0.0–5.0)
HCT: 41.2 % (ref 36.0–46.0)
Hemoglobin: 13.5 g/dL (ref 12.0–15.0)
Lymphocytes Relative: 19 % (ref 12.0–46.0)
Lymphs Abs: 1.5 10*3/uL (ref 0.7–4.0)
MCHC: 32.8 g/dL (ref 30.0–36.0)
MCV: 88.8 fl (ref 78.0–100.0)
Monocytes Absolute: 0.7 10*3/uL (ref 0.1–1.0)
Monocytes Relative: 9.3 % (ref 3.0–12.0)
Neutro Abs: 5.4 10*3/uL (ref 1.4–7.7)
Neutrophils Relative %: 69.3 % (ref 43.0–77.0)
Platelets: 325 10*3/uL (ref 150.0–400.0)
RBC: 4.64 Mil/uL (ref 3.87–5.11)
RDW: 14.5 % (ref 11.5–15.5)
WBC: 7.8 10*3/uL (ref 4.0–10.5)

## 2023-07-04 LAB — HEMOGLOBIN A1C: Hgb A1c MFr Bld: 5.5 % (ref 4.6–6.5)

## 2023-07-04 LAB — MAGNESIUM: Magnesium: 2.1 mg/dL (ref 1.5–2.5)

## 2023-07-04 LAB — VITAMIN D 25 HYDROXY (VIT D DEFICIENCY, FRACTURES): VITD: 73.38 ng/mL (ref 30.00–100.00)

## 2023-07-04 MED ORDER — EZETIMIBE 10 MG PO TABS: 10.0000 mg | ORAL_TABLET | Freq: Every day | ORAL | 3 refills | Status: DC

## 2023-07-04 MED ORDER — IRBESARTAN-HYDROCHLOROTHIAZIDE 300-12.5 MG PO TABS: 1.0000 | ORAL_TABLET | Freq: Every day | ORAL | 1 refills | Status: DC

## 2023-07-04 NOTE — Assessment & Plan Note (Signed)
 Chronic Stressed the importance of weight loss for multiple reasons Stressed that she needs to be exercising regularly Stressed to decrease calorie intake, healthy eating Folivane protein and vegetables Referral to nutrition

## 2023-07-04 NOTE — Assessment & Plan Note (Signed)
 Chronic Continues to have muscle cramping in feet and lower legs-typically only 1 leg at a time, but occurs in both legs She does drink plenty of fluids so unlikely dehydration CMP, magnesium Possibly related to hydrochlorothiazide -we will decrease the dose to see if that helps No back pain, but discussed that could be orthopedic in nature She will try to increase her walking

## 2023-07-04 NOTE — Assessment & Plan Note (Signed)
 New She states poor balance-not sure why and has had 6 falls since the beginning of the year She denies any lightheadedness, dizziness, numbness/tingling She denies her legs giving out on her She has some right knee pain when she walks, but its mil mild She denies back pain Unsure of cause of poor balance, but very concerning given the number of falls Will refer to neurology to rule out neurological cause Deferred PT She will try to get back to her regular walking and will use a walking stick Encouraged her to start doing balance exercises at home

## 2023-07-04 NOTE — Assessment & Plan Note (Signed)
 Chronic Lab Results  Component Value Date   HGBA1C 5.4 01/03/2023    Sugars well-controlled Will check A1c today Regular exercise, low sugar/carbohydrate diet

## 2023-07-04 NOTE — Assessment & Plan Note (Addendum)
 Chronic History of CVA Continue aspirin 81 mg daily Has tried to statins and had muscle aches, cramping Continue Zetia  10 mg daily-has been tolerating it Discussed she may need additional medication to get LDL to goal Blood pressure well controlled Encouraged healthy diet, regular exercise

## 2023-07-04 NOTE — Assessment & Plan Note (Addendum)
 New DEXA 01/2023 with osteoporosis-reviewed bone density with her Continue regular walking Continue vitamin D , multivitamin Discussed medication-recommend Fosamax 70 mg weekly-discussed possible side effects Check vitamin D  level Encouraged her to discuss with her gynecologist to get his opinion-she has an upcoming appointment

## 2023-07-04 NOTE — Assessment & Plan Note (Signed)
 Chronic LDL goal < 70 Lab Results  Component Value Date   LDLCALC 190 (H) 01/03/2023   Regular exercise and healthy diet encouraged Check lipid panel, CMP, TSH Currently not on a statin secondary to myalgias Continue Zetia  10 mg daily If LDL not at goal need to consider repatha

## 2023-07-04 NOTE — Assessment & Plan Note (Addendum)
 Chronic Blood pressure controlled Cmp, cbc She is experiencing muscle cramping which could potentially be related to hydrochlorothiazide  Currently taking amlodipine  10 mg daily, Avalide 150-12.5 mg daily, hydrochlorothiazide  12.5 mg daily Will continue amlodipine  10 mg daily, stop other medications Start Avalide 300-12.5 mg daily

## 2023-07-04 NOTE — Assessment & Plan Note (Signed)
 New States poor balance and has had 6 falls this year No obvious cause Referral to neurology for further evaluation

## 2023-07-04 NOTE — Assessment & Plan Note (Signed)
Chronic Situational, Controlled Continue alprazolam 0.5 mg twice daily as needed

## 2023-07-04 NOTE — Assessment & Plan Note (Addendum)
 Chronic Likely related to NSAID use - She has stopped all NSAIDs  Blood pressure, sugar well-controlled She is compliant with good water intake CBC, CMP

## 2023-07-04 NOTE — Assessment & Plan Note (Addendum)
 Chronic Related to diuretic use No longer taking potassium cmp Possible cause of muscle cramping Will adjust blood pressure medications as above which hopefully will help and she should not need potassium supplementation

## 2023-07-07 ENCOUNTER — Encounter: Payer: Self-pay | Admitting: Internal Medicine

## 2023-07-13 ENCOUNTER — Encounter: Payer: Self-pay | Admitting: Neurology

## 2023-07-16 DIAGNOSIS — Z13228 Encounter for screening for other metabolic disorders: Secondary | ICD-10-CM | POA: Diagnosis not present

## 2023-07-16 DIAGNOSIS — N760 Acute vaginitis: Secondary | ICD-10-CM | POA: Diagnosis not present

## 2023-07-16 DIAGNOSIS — Z01419 Encounter for gynecological examination (general) (routine) without abnormal findings: Secondary | ICD-10-CM | POA: Diagnosis not present

## 2023-07-16 DIAGNOSIS — Z131 Encounter for screening for diabetes mellitus: Secondary | ICD-10-CM | POA: Diagnosis not present

## 2023-07-16 DIAGNOSIS — Z13220 Encounter for screening for lipoid disorders: Secondary | ICD-10-CM | POA: Diagnosis not present

## 2023-07-16 DIAGNOSIS — M810 Age-related osteoporosis without current pathological fracture: Secondary | ICD-10-CM | POA: Diagnosis not present

## 2023-07-16 DIAGNOSIS — Z1321 Encounter for screening for nutritional disorder: Secondary | ICD-10-CM | POA: Diagnosis not present

## 2023-07-16 DIAGNOSIS — E7800 Pure hypercholesterolemia, unspecified: Secondary | ICD-10-CM | POA: Diagnosis not present

## 2023-07-16 DIAGNOSIS — Z130 Encounter for screening for diseases of the blood and blood-forming organs and certain disorders involving the immune mechanism: Secondary | ICD-10-CM | POA: Diagnosis not present

## 2023-07-16 DIAGNOSIS — Z1329 Encounter for screening for other suspected endocrine disorder: Secondary | ICD-10-CM | POA: Diagnosis not present

## 2023-07-16 DIAGNOSIS — Z6838 Body mass index (BMI) 38.0-38.9, adult: Secondary | ICD-10-CM | POA: Diagnosis not present

## 2023-07-21 ENCOUNTER — Encounter (HOSPITAL_COMMUNITY): Payer: Self-pay

## 2023-07-28 ENCOUNTER — Ambulatory Visit
Admission: RE | Admit: 2023-07-28 | Discharge: 2023-07-28 | Disposition: A | Discharge status: Home / Self Care | Source: Ambulatory Visit | Attending: Internal Medicine | Admitting: Internal Medicine

## 2023-07-28 DIAGNOSIS — D0511 Intraductal carcinoma in situ of right breast: Secondary | ICD-10-CM | POA: Diagnosis not present

## 2023-07-28 DIAGNOSIS — Z9889 Other specified postprocedural states: Secondary | ICD-10-CM | POA: Diagnosis not present

## 2023-08-01 ENCOUNTER — Encounter

## 2023-08-15 NOTE — Progress Notes (Deleted)
 Medical Nutrition Therapy  Appointment Start time:  ***  Appointment End time:  ***  Primary concerns today: ***  Referral diagnosis: primary hypertension, prediabetes, mixed hyperlipidemia, morbid obesity Preferred learning style: *** (auditory, visual, hands on, no preference indicated) Learning readiness: *** (not ready, contemplating, ready, change in progress)   NUTRITION ASSESSMENT    Clinical Medical Hx: *** Medications: *** Labs: *** Notable Signs/Symptoms: ***  Lifestyle & Dietary Hx ***  Estimated daily fluid intake: *** oz Supplements: *** Sleep: *** Stress / self-care: *** Current average weekly physical activity: ***  24-Hr Dietary Recall First Meal: *** Snack: *** Second Meal: *** Snack: *** Third Meal: *** Snack: *** Beverages: ***  Estimated Energy Needs Calories: *** Carbohydrate: ***g Protein: ***g Fat: ***g   NUTRITION DIAGNOSIS  {CHL AMB NUTRITIONAL [phone]}   NUTRITION INTERVENTION  Nutrition education (E-1) on the following topics:  ***  Handouts Provided Include  ***  Learning Style & Readiness for Change Teaching method utilized: Visual & Auditory  Demonstrated degree of understanding via: Teach Back  Barriers to learning/adherence to lifestyle change: ***  Goals Established by Pt ***   MONITORING & EVALUATION Dietary intake, weekly physical activity, and *** in ***.  Next Steps  Patient is to ***.

## 2023-08-22 ENCOUNTER — Ambulatory Visit: Admitting: Dietician

## 2023-08-22 DIAGNOSIS — E782 Mixed hyperlipidemia: Secondary | ICD-10-CM

## 2023-08-22 DIAGNOSIS — R7303 Prediabetes: Secondary | ICD-10-CM

## 2023-08-22 DIAGNOSIS — I10 Essential (primary) hypertension: Secondary | ICD-10-CM

## 2023-09-05 ENCOUNTER — Ambulatory Visit: Admitting: Neurology

## 2023-09-19 DIAGNOSIS — H401234 Low-tension glaucoma, bilateral, indeterminate stage: Secondary | ICD-10-CM | POA: Diagnosis not present

## 2023-09-22 DIAGNOSIS — U071 COVID-19: Secondary | ICD-10-CM | POA: Diagnosis not present

## 2023-09-22 DIAGNOSIS — R0981 Nasal congestion: Secondary | ICD-10-CM | POA: Diagnosis not present

## 2023-10-13 ENCOUNTER — Other Ambulatory Visit: Payer: Self-pay | Admitting: Internal Medicine

## 2023-10-17 ENCOUNTER — Encounter: Payer: Self-pay | Admitting: Neurology

## 2023-10-17 ENCOUNTER — Ambulatory Visit: Admitting: Neurology

## 2023-10-17 DIAGNOSIS — Z029 Encounter for administrative examinations, unspecified: Secondary | ICD-10-CM

## 2023-11-28 ENCOUNTER — Other Ambulatory Visit: Payer: Self-pay | Admitting: Internal Medicine

## 2023-12-26 ENCOUNTER — Other Ambulatory Visit: Payer: Self-pay | Admitting: Internal Medicine

## 2024-01-09 NOTE — Patient Instructions (Addendum)

## 2024-01-09 NOTE — Progress Notes (Signed)
 Subjective:    Patient ID: Shelley Hayes, female    DOB: 29-Jan-1946, 78 y.o.   MRN: [id number]      HPI Shelley Hayes is here for a Physical exam and her chronic medical problems.   ?  1 treatment for osteoporosis ?  Not taking Zetia -Hx CVA-needs LDL to be less than 70   Medications and allergies reviewed with patient and updated if appropriate.  Current Outpatient Medications on File Prior to Visit  Medication Sig Dispense Refill  . albuterol  (VENTOLIN  HFA) 108 (90 Base) MCG/ACT inhaler Inhale 2 puffs into the lungs every 6 (six) hours as needed for wheezing or shortness of breath. 18 g 5  . ALPRAZolam  (XANAX ) 0.5 MG tablet Take 1 tablet (0.5 mg total) by mouth 2 (two) times daily as needed for anxiety or sleep. 30 tablet 0  . amLODipine  (NORVASC ) 10 MG tablet TAKE 1 TABLET EVERY DAY 90 tablet 3  . aspirin 81 MG tablet Take 81 mg by mouth daily.    . Cholecalciferol (VITAMIN D3) 1000 UNITS CAPS Take 1 capsule (1,000 Units total) by mouth daily. 30 capsule   . cyanocobalamin  (VITAMIN B12) 100 MCG tablet     . ezetimibe  (ZETIA ) 10 MG tablet Take 1 tablet (10 mg total) by mouth daily. 90 tablet 3  . irbesartan -hydrochlorothiazide  (AVALIDE) 300-12.5 MG tablet TAKE 1 TABLET EVERY DAY (NEW DOSE) 90 tablet 3  . latanoprost  (XALATAN ) 0.005 % ophthalmic solution PLACE 1 DROP INTO BOTH EYES AT BEDTIME. 3 mL 0  . Multiple Vitamin (MULTIVITAMIN) tablet Take 1 tablet by mouth daily.     No current facility-administered medications on file prior to visit.    Review of Systems     Objective:  There were no vitals filed for this visit. There were no vitals filed for this visit. There is no height or weight on file to calculate BMI.  BP Readings from Last 3 Encounters:  07/04/23 118/80  01/17/23 (!) 140/80  01/03/23 126/70    Wt Readings from Last 3 Encounters:  07/04/23 184 lb (83.5 kg)  04/10/23 179 lb (81.2 kg)  01/17/23 179 lb (81.2 kg)       Physical Exam Constitutional: She  appears well-developed and well-nourished. No distress.  HENT:  Head: Normocephalic and atraumatic.  Right Ear: External ear normal. Normal ear canal and TM Left Ear: External ear normal.  Normal ear canal and TM Mouth/Throat: Oropharynx is clear and moist.  Eyes: Conjunctivae normal.  Neck: Neck supple. No tracheal deviation present. No thyromegaly present.  No carotid bruit  Cardiovascular: Normal rate, regular rhythm and normal heart sounds.   No murmur heard.  No edema. Pulmonary/Chest: Effort normal and breath sounds normal. No respiratory distress. She has no wheezes. She has no rales.  Breast: deferred   Abdominal: Soft. She exhibits no distension. There is no tenderness.  Lymphadenopathy: She has no cervical adenopathy.  Skin: Skin is warm and dry. She is not diaphoretic.  Psychiatric: She has a normal mood and affect. Her behavior is normal.     Lab Results  Component Value Date   WBC 7.8 07/04/2023   HGB 13.5 07/04/2023   HCT 41.2 07/04/2023   PLT 325.0 07/04/2023   GLUCOSE 95 07/04/2023   CHOL 221 (H) 07/04/2023   TRIG 171.0 (H) 07/04/2023   HDL 45.60 07/04/2023   LDLDIRECT 142.0 06/22/2021   LDLCALC 141 (H) 07/04/2023   ALT 15 07/04/2023   AST 24 07/04/2023   NA 142 07/04/2023  K 4.2 07/04/2023   CL 104 07/04/2023   CREATININE 0.98 07/04/2023   BUN 16 07/04/2023   CO2 31 07/04/2023   TSH 1.05 06/30/2022   HGBA1C 5.5 07/04/2023         Assessment & Plan:   Physical exam: Screening blood work  ordered Exercise   Weight   Substance abuse  none   Reviewed recommended immunizations.   Health Maintenance  Topic Date Due  . Zoster Vaccines- Shingrix (1 of 2) Never done  . DTaP/Tdap/Td (1 - Tdap) 10/26/2011  . Colonoscopy  09/27/2023  . Influenza Vaccine  10/12/2023  . Medicare Annual Wellness (AWV)  04/09/2024  . Bone Density Scan  02/01/2026  . Pneumococcal Vaccine: 50+ Years  Completed  . Hepatitis C Screening  Completed  . Meningococcal B  Vaccine  Aged Out  . Mammogram  Discontinued  . COVID-19 Vaccine  Discontinued          See Problem List for Assessment and Plan of chronic medical problems.     This encounter was created in error - please disregard.

## 2024-01-10 ENCOUNTER — Encounter: Admitting: Internal Medicine

## 2024-01-10 ENCOUNTER — Encounter: Payer: Self-pay | Admitting: Internal Medicine

## 2024-01-10 DIAGNOSIS — Z8673 Personal history of transient ischemic attack (TIA), and cerebral infarction without residual deficits: Secondary | ICD-10-CM

## 2024-01-10 DIAGNOSIS — R7303 Prediabetes: Secondary | ICD-10-CM

## 2024-01-10 DIAGNOSIS — Z0000 Encounter for general adult medical examination without abnormal findings: Secondary | ICD-10-CM

## 2024-01-10 DIAGNOSIS — M810 Age-related osteoporosis without current pathological fracture: Secondary | ICD-10-CM

## 2024-01-10 DIAGNOSIS — N1831 Chronic kidney disease, stage 3a: Secondary | ICD-10-CM

## 2024-01-10 DIAGNOSIS — E7800 Pure hypercholesterolemia, unspecified: Secondary | ICD-10-CM

## 2024-01-10 DIAGNOSIS — I10 Essential (primary) hypertension: Secondary | ICD-10-CM

## 2024-01-10 DIAGNOSIS — F419 Anxiety disorder, unspecified: Secondary | ICD-10-CM

## 2024-01-10 NOTE — Assessment & Plan Note (Signed)
 Chronic LDL goal < 70 Lab Results  Component Value Date   LDLCALC 141 (H) 07/04/2023   Regular exercise and healthy diet encouraged Check lipid panel, CMP, TSH Currently not on a statin secondary to myalgias Continue Zetia  10 mg daily If LDL not at goal need to consider repatha

## 2024-01-10 NOTE — Assessment & Plan Note (Signed)
 Chronic BMI Stressed the importance of weight loss for multiple reasons Stressed that she needs to be exercising regularly Stressed to decrease calorie intake, healthy eating-increase protein, fiber and vegetables

## 2024-01-10 NOTE — Assessment & Plan Note (Signed)
 Chronic Likely related to NSAID use - She has stopped all NSAIDs  Blood pressure, sugar well-controlled She is compliant with good water intake CBC, CMP

## 2024-01-10 NOTE — Assessment & Plan Note (Signed)
 Chronic Blood pressure controlled Cmp, cbc She is experiencing muscle cramping which could potentially be related to hydrochlorothiazide  Continue amlodipine  10 mg daily, Avalide 300-12.5 mg daily

## 2024-01-10 NOTE — Assessment & Plan Note (Signed)
 Chronic History of CVA Continue aspirin 81 mg daily Has tried to statins and had muscle aches, cramping Continue Zetia  10 mg daily-has been tolerating it Discussed she may need additional medication to get LDL to goal Blood pressure well controlled Encouraged healthy diet, regular exercise

## 2024-01-10 NOTE — Assessment & Plan Note (Signed)
 Chronic Lab Results  Component Value Date   HGBA1C 5.5 07/04/2023    Sugars well-controlled Will check A1c today Regular exercise, low sugar/carbohydrate diet

## 2024-01-10 NOTE — Assessment & Plan Note (Signed)
 Chronic DEXA up-to-date Continue vitamin D , multivitamin Discussed medication-recommend Fosamax 70 mg weekly at her last visit and reviewed possible side effects Check vitamin D  level

## 2024-01-10 NOTE — Assessment & Plan Note (Signed)
 Chronic Situational, Controlled Continue alprazolam  0.5 mg twice daily as needed-rarely takes

## 2024-01-23 DIAGNOSIS — H401234 Low-tension glaucoma, bilateral, indeterminate stage: Secondary | ICD-10-CM | POA: Diagnosis not present

## 2024-03-21 ENCOUNTER — Ambulatory Visit

## 2024-03-25 ENCOUNTER — Ambulatory Visit

## 2024-03-25 VITALS — Ht 59.0 in | Wt 184.0 lb

## 2024-03-25 DIAGNOSIS — Z0000 Encounter for general adult medical examination without abnormal findings: Secondary | ICD-10-CM

## 2024-03-25 NOTE — Progress Notes (Signed)
 "  Chief Complaint  Patient presents with   Medicare Wellness     Subjective:   Shelley Hayes is a 79 y.o. female who presents for a Medicare Annual Wellness Visit.  Visit info / Clinical Intake: Medicare Wellness Visit Type:: Subsequent Annual Wellness Visit Persons participating in visit and providing information:: patient Medicare Wellness Visit Mode:: Telephone If telephone:: video declined Since this visit was completed virtually, some vitals may be partially provided or unavailable. Missing vitals are due to the limitations of the virtual format.: Unable to obtain vitals - no equipment If Telephone or Video please confirm:: I connected with patient using audio/video enable telemedicine. I verified patient identity with two identifiers, discussed telehealth limitations, and patient agreed to proceed. Patient Location:: home Provider Location:: home Interpreter Needed?: No Pre-visit prep was completed: yes AWV questionnaire completed by patient prior to visit?: no Living arrangements:: (!) lives alone Patient's Overall Health Status Rating: very good Typical amount of pain: none Does pain affect daily life?: no Are you currently prescribed opioids?: no  Dietary Habits and Nutritional Risks How many meals a day?: 3 Eats fruit and vegetables daily?: yes Most meals are obtained by: preparing own meals In the last 2 weeks, have you had any of the following?: (!) nausea, vomiting, diarrhea (diarrhea) Diabetic:: no  Functional Status Activities of Daily Living (to include ambulation/medication): Independent Ambulation: Independent with device- listed below Home Assistive Devices/Equipment: [place]; Eyeglasses Medication Administration: Independent Home Management (perform basic housework or laundry): Independent Manage your own finances?: yes Primary transportation is: driving Concerns about vision?: no *vision screening is required for WTM* Concerns about hearing?: no  Fall  Screening Falls in the past year?: 1 Number of falls in past year: 0 Was there an injury with Fall?: 0 Fall Risk Category Calculator: 1 Patient Fall Risk Level: Low Fall Risk  Fall Risk Patient at Risk for Falls Due to: Impaired balance/gait Fall risk Follow up: Falls evaluation completed; Falls prevention discussed  Home and Transportation Safety: All rugs have non-skid backing?: N/A, no rugs All stairs or steps have railings?: N/A, no stairs Grab bars in the bathtub or shower?: yes Have non-skid surface in bathtub or shower?: yes Good home lighting?: yes Regular seat belt use?: yes Hospital stays in the last year:: no  Cognitive Assessment Difficulty concentrating, remembering, or making decisions? : no Will 6CIT or Mini Cog be Completed: no 6CIT or Mini Cog Declined: patient alert, oriented, able to answer questions appropriately and recall recent events What year is it?: 0 points What month is it?: 0 points Give patient an address phrase to remember (5 components): [address], [place] About what time is it?: 0 points Count backwards from 20 to 1: 0 points Say the months of the year in reverse: 0 points Repeat the address phrase from earlier: 2 points 6 CIT Score: 2 points  Advance Directives (For Healthcare) Does Patient Have a Medical Advance Directive?: Yes Type of Advance Directive: Healthcare Power of Attorney Copy of Healthcare Power of Attorney in Chart?: No - copy requested Copy of Living Will in Chart?: No - copy requested  Reviewed/Updated  Reviewed/Updated: Reviewed All (Medical, Surgical, Family, Medications, Allergies, Care Teams, Patient Goals)    Allergies (verified) Hydrocodone bit-homatrop mbr, Atorvastatin , Losartan , Meloxicam , and Spironolactone    Current Medications (verified) Outpatient Encounter Medications as of 03/25/2024  Medication Sig   albuterol  (VENTOLIN  HFA) 108 (90 Base) MCG/ACT inhaler Inhale 2 puffs into the lungs every 6 (six)  hours as needed  for wheezing or shortness of breath.   ALPRAZolam  (XANAX ) 0.5 MG tablet Take 1 tablet (0.5 mg total) by mouth 2 (two) times daily as needed for anxiety or sleep.   amLODipine  (NORVASC ) 10 MG tablet TAKE 1 TABLET EVERY DAY   aspirin 81 MG tablet Take 81 mg by mouth daily.   Cholecalciferol (VITAMIN D3) 1000 UNITS CAPS Take 1 capsule (1,000 Units total) by mouth daily.   cyanocobalamin  (VITAMIN B12) 100 MCG tablet    irbesartan -hydrochlorothiazide  (AVALIDE) 300-12.5 MG tablet TAKE 1 TABLET EVERY DAY (NEW DOSE)   latanoprost  (XALATAN ) 0.005 % ophthalmic solution PLACE 1 DROP INTO BOTH EYES AT BEDTIME.   Multiple Vitamin (MULTIVITAMIN) tablet Take 1 tablet by mouth daily.   ezetimibe  (ZETIA ) 10 MG tablet Take 1 tablet (10 mg total) by mouth daily.   No facility-administered encounter medications on file as of 03/25/2024.    History: Past Medical History:  Diagnosis Date   ALLERGIC RHINITIS    Allergic rhinitis 08/23/2009   Allergy    Anxiety    ARTHRITIS    Arthritis    Blood in stool 04/24/2017   Cancer (HCC) 05/2021   right breast DCIS   Cataract    bil cataracts removes   Cervical paraspinal muscle spasm 12/23/2015   Chest pain 04/24/2017   CHEST PAIN 12/19/2009   Qualifier: Diagnosis of  By: Wilhemina RMA, Lucy     COLONIC POLYPS, HX OF 08/23/2009   Qualifier: Diagnosis of  By: Wilhemina RMA, Lucy     Cough 06/19/2016   Fluttering heart 12/23/2015   GERD (gastroesophageal reflux disease)    Glaucoma    GLUCOMA    Heart murmur    Herpes zoster without complication 04/24/2017   Hyperglycemia 06/19/2016   HYPERLIPIDEMIA    HYPERTENSION    KNEE PAIN 08/23/2009   Qualifier: Diagnosis of  By: Inocencio MD, Valerie A    Muscle cramps 06/19/2016   Osteopenia 04/26/2016   2/18 - dexa - RFN and LFN  -1.2   OTITIS EXTERNA, RIGHT 01/20/2010   Qualifier: Diagnosis of  By: Inocencio MD, Valerie A    Pre-diabetes    Prediabetes 04/24/2017   SCIATICA, LEFT 11/2010   Past  Surgical History:  Procedure Laterality Date   BREAST BIOPSY Right    benign   BREAST LUMPECTOMY WITH RADIOACTIVE SEED LOCALIZATION Right 06/28/2021   Procedure: RIGHT BREAST LUMPECTOMY WITH RADIOACTIVE SEED LOCALIZATION;  Surgeon: Vanderbilt Ned, MD;  Location: [facility];  Service: General;  Laterality: Right;   BREAST RECONSTRUCTION Right 07/05/2021   Procedure: BREAST RECONSTRUCTION;  Surgeon: Arelia Filippo, MD;  Location: [facility];  Service: Plastics;  Laterality: Right;   BREAST REDUCTION SURGERY Left 07/05/2021   Procedure: MAMMARY REDUCTION  (BREAST);  Surgeon: Arelia Filippo, MD;  Location: [facility];  Service: Plastics;  Laterality: Left;   COLONOSCOPY     ESOPHAGOGASTRODUODENOSCOPY     POLYPECTOMY     TUBAL LIGATION     UPPER GASTROINTESTINAL ENDOSCOPY     Family History  Problem Relation Age of Onset   Colon cancer Mother 12       Death result of a stroke   Arthritis Mother    Diabetes Mother    Colon polyps Mother    Arthritis Father    Alcohol abuse Brother    Lung cancer Brother    Prostate cancer Brother    Diabetes Brother    Hypertension Brother    Lung cancer Other  Neice   Esophageal cancer Neg Hx    Liver cancer Neg Hx    Pancreatic cancer Neg Hx    Rectal cancer Neg Hx    Stomach cancer Neg Hx    Social History   Occupational History   Occupation: Retired  Tobacco Use   Smoking status: Never   Smokeless tobacco: Never  Vaping Use   Vaping status: Never Used  Substance and Sexual Activity   Alcohol use: No    Alcohol/week: 0.0 standard drinks of alcohol   Drug use: No   Sexual activity: Not Currently   Tobacco Counseling Counseling given: Not Answered  SDOH Screenings   Food Insecurity: No Food Insecurity (03/25/2024)  Housing: Unknown (03/25/2024)  Transportation Needs: No Transportation Needs (03/25/2024)  Utilities: Not At Risk (03/25/2024)  Alcohol Screen: Low Risk (04/10/2023)   Depression (PHQ2-9): Low Risk (03/25/2024)  Financial Resource Strain: Low Risk (04/10/2023)  Physical Activity: Insufficiently Active (03/25/2024)  Social Connections: Moderately Integrated (03/25/2024)  Stress: No Stress Concern Present (03/25/2024)  Tobacco Use: Low Risk (03/25/2024)  Health Literacy: Adequate Health Literacy (03/25/2024)   See flowsheets for full screening details  Depression Screen PHQ 2 & 9 Depression Scale- Over the past 2 weeks, how often have you been bothered by any of the following problems? Little interest or pleasure in doing things: 0 Feeling down, depressed, or hopeless (PHQ Adolescent also includes...irritable): 1 (due 5 deaths) PHQ-2 Total Score: 1 Trouble falling or staying asleep, or sleeping too much: 0 Feeling tired or having little energy: 0 Poor appetite or overeating (PHQ Adolescent also includes...weight loss): 0 Feeling bad about yourself - or that you are a failure or have let yourself or your family down: 0 Trouble concentrating on things, such as reading the newspaper or watching television (PHQ Adolescent also includes...like school work): 0 Moving or speaking so slowly that other people could have noticed. Or the opposite - being so fidgety or restless that you have been moving around a lot more than usual: 0 Thoughts that you would be better off dead, or of hurting yourself in some way: 0 PHQ-9 Total Score: 1 If you checked off any problems, how difficult have these problems made it for you to do your work, take care of things at home, or get along with other people?: Not difficult at all  Depression Treatment Depression Interventions/Treatment : EYV7-0 Score <4 Follow-up Not Indicated     Goals Addressed               This Visit's Progress     Weight (lb) < 175 lb (79.4 kg) (pt-stated)   184 lb (83.5 kg)     Would like to lose 20 lbs by increasing activity (joining [employer]) and continuing health food choices. On  Track-2025 Per pt-still working on this goal/2026             Objective:    Today's Vitals   03/25/24 1338  Weight: 184 lb (83.5 kg)  Height: 4' 11 (1.499 m)   Body mass index is 37.16 kg/m.  Hearing/Vision screen Hearing Screening - Comments:: Denies hearing difficulties   Vision Screening - Comments:: Wears eyeglasses/ [facility]/UTD Immunizations and Health Maintenance Health Maintenance  Topic Date Due   Zoster Vaccines- Shingrix (1 of 2) Never done   DTaP/Tdap/Td (1 - Tdap) 10/26/2011   Colonoscopy  09/27/2023   Influenza Vaccine  10/12/2023   Medicare Annual Wellness (AWV)  03/25/2025   Bone Density Scan  02/01/2026  Pneumococcal Vaccine: 50+ Years  Completed   Hepatitis C Screening  Completed   Meningococcal B Vaccine  Aged Out   Mammogram  Discontinued   COVID-19 Vaccine  Discontinued        Assessment/Plan:  This is a routine wellness examination for [place].  Patient Care Team: Geofm Glade PARAS, MD as PCP - General (Internal Medicine) Delford Maude BROCKS, MD as PCP - Cardiology (Cardiology) Curlene Agent, MD (Obstetrics and Gynecology) Wynona Norleen BRAVO, MD as Referring Physician (Optometry) Szabat, Toribio BROCKS, [facility] (Inactive) (Pharmacist)  I have personally reviewed and noted the following in the patients chart:   Medical and social history Use of alcohol, tobacco or illicit drugs  Current medications and supplements including opioid prescriptions. Functional ability and status Nutritional status Physical activity Advanced directives List of other physicians Hospitalizations, surgeries, and ER visits in previous 12 months Vitals Screenings to include cognitive, depression, and falls Referrals and appointments  No orders of the defined types were placed in this encounter.  In addition, I have reviewed and discussed with patient certain preventive protocols, quality metrics, and best practice recommendations. A written personalized care plan for  preventive services as well as general preventive health recommendations were provided to patient.   Sabeen Piechocki L Tacuma Graffam, CMA   03/25/2024   Return in 1 year (on 03/25/2025).  After Visit Summary: (MyChart) Due to this being a telephonic visit, the after visit summary with patients personalized plan was offered to patient via MyChart   Nurse Notes: Patient is due for a colonoscopy.  She is also due for a tdap and a Shingles vaccine.  Patient stated that she has gotten her flu vaccine for this past year.  I could not update due to patient not in [facility].   "

## 2024-03-25 NOTE — Patient Instructions (Addendum)
 [name],  Thank you for taking the time for your Medicare Wellness Visit. I appreciate your continued commitment to your health goals. Please review the care plan we discussed, and feel free to reach out if I can assist you further.  Please note that Annual Wellness Visits do not include a physical exam. Some assessments may be limited, especially if the visit was conducted virtually. If needed, we may recommend an in-person follow-up with your provider.  Ongoing Care Seeing your primary care provider every 3 to 6 months helps us  monitor your health and provide consistent, personalized care. Next office visit on 04/01/2024.  You are due for a tetanus vaccine and a shingles vaccine.  Both can be given at your local pharmacy.  Remember to call and get scheduled for a colonoscopy at  [facility] Phone: [phone]   Referrals If a referral was made during today's visit and you haven't received any updates within two weeks, please contact the referred provider directly to check on the status.  Recommended Screenings:  Health Maintenance  Topic Date Due   Zoster (Shingles) Vaccine (1 of 2) Never done   DTaP/Tdap/Td vaccine (1 - Tdap) 10/26/2011   Colon Cancer Screening  09/27/2023   Flu Shot  10/12/2023   Medicare Annual Wellness Visit  03/25/2025   Osteoporosis screening with Bone Density Scan  02/01/2026   Pneumococcal Vaccine for age over 28  Completed   Hepatitis C Screening  Completed   Meningitis B Vaccine  Aged Out   Breast Cancer Screening  Discontinued   COVID-19 Vaccine  Discontinued       03/25/2024    1:58 PM  Advanced Directives  Does Patient Have a Medical Advance Directive? Yes  Type of Advance Directive Healthcare Power of Attorney  Copy of Healthcare Power of Attorney in Chart? No - copy requested    Vision: Annual vision screenings are recommended for early detection of glaucoma, cataracts, and diabetic retinopathy. These exams can  also reveal signs of chronic conditions such as diabetes and high blood pressure.  Dental: Annual dental screenings help detect early signs of oral cancer, gum disease, and other conditions linked to overall health, including heart disease and diabetes.  Please see the attached documents for additional preventive care recommendations.

## 2024-04-01 ENCOUNTER — Encounter: Payer: Self-pay | Admitting: Internal Medicine

## 2024-04-01 ENCOUNTER — Ambulatory Visit: Admitting: Internal Medicine

## 2024-04-01 VITALS — BP 130/70 | HR 68 | Temp 98.3°F | Ht 59.0 in | Wt 184.0 lb

## 2024-04-01 DIAGNOSIS — E7800 Pure hypercholesterolemia, unspecified: Secondary | ICD-10-CM | POA: Diagnosis not present

## 2024-04-01 DIAGNOSIS — Z0000 Encounter for general adult medical examination without abnormal findings: Secondary | ICD-10-CM | POA: Diagnosis not present

## 2024-04-01 DIAGNOSIS — M810 Age-related osteoporosis without current pathological fracture: Secondary | ICD-10-CM

## 2024-04-01 DIAGNOSIS — N1831 Chronic kidney disease, stage 3a: Secondary | ICD-10-CM | POA: Diagnosis not present

## 2024-04-01 DIAGNOSIS — Z860100 Personal history of colon polyps, unspecified: Secondary | ICD-10-CM | POA: Diagnosis not present

## 2024-04-01 DIAGNOSIS — R079 Chest pain, unspecified: Secondary | ICD-10-CM | POA: Insufficient documentation

## 2024-04-01 DIAGNOSIS — Z8673 Personal history of transient ischemic attack (TIA), and cerebral infarction without residual deficits: Secondary | ICD-10-CM

## 2024-04-01 DIAGNOSIS — R7303 Prediabetes: Secondary | ICD-10-CM | POA: Diagnosis not present

## 2024-04-01 DIAGNOSIS — I10 Essential (primary) hypertension: Secondary | ICD-10-CM

## 2024-04-01 DIAGNOSIS — Z6837 Body mass index (BMI) 37.0-37.9, adult: Secondary | ICD-10-CM

## 2024-04-01 DIAGNOSIS — Z136 Encounter for screening for cardiovascular disorders: Secondary | ICD-10-CM

## 2024-04-01 DIAGNOSIS — F419 Anxiety disorder, unspecified: Secondary | ICD-10-CM

## 2024-04-01 LAB — VITAMIN D 25 HYDROXY (VIT D DEFICIENCY, FRACTURES): VITD: 93.45 ng/mL (ref 30.00–100.00)

## 2024-04-01 LAB — COMPREHENSIVE METABOLIC PANEL WITH GFR
ALT: 11 U/L (ref 3–35)
AST: 19 U/L (ref 5–37)
Albumin: 4.3 g/dL (ref 3.5–5.2)
Alkaline Phosphatase: 67 U/L (ref 39–117)
BUN: 15 mg/dL (ref 6–23)
CO2: 30 meq/L (ref 19–32)
Calcium: 9.5 mg/dL (ref 8.4–10.5)
Chloride: 106 meq/L (ref 96–112)
Creatinine, Ser: 1.23 mg/dL — ABNORMAL HIGH (ref 0.40–1.20)
GFR: 42.08 mL/min — ABNORMAL LOW
Glucose, Bld: 97 mg/dL (ref 70–99)
Potassium: 3.4 meq/L — ABNORMAL LOW (ref 3.5–5.1)
Sodium: 144 meq/L (ref 135–145)
Total Bilirubin: 0.3 mg/dL (ref 0.2–1.2)
Total Protein: 8.1 g/dL (ref 6.0–8.3)

## 2024-04-01 LAB — CBC
HCT: 42.4 % (ref 36.0–46.0)
Hemoglobin: 13.7 g/dL (ref 12.0–15.0)
MCHC: 32.3 g/dL (ref 30.0–36.0)
MCV: 86.9 fl (ref 78.0–100.0)
Platelets: 328 K/uL (ref 150.0–400.0)
RBC: 4.88 Mil/uL (ref 3.87–5.11)
RDW: 14.2 % (ref 11.5–15.5)
WBC: 7.8 K/uL (ref 4.0–10.5)

## 2024-04-01 LAB — LIPID PANEL
Cholesterol: 227 mg/dL — ABNORMAL HIGH (ref 28–200)
HDL: 44.1 mg/dL
LDL Cholesterol: 147 mg/dL — ABNORMAL HIGH (ref 10–99)
NonHDL: 183.1
Total CHOL/HDL Ratio: 5
Triglycerides: 183 mg/dL — ABNORMAL HIGH (ref 10.0–149.0)
VLDL: 36.6 mg/dL (ref 0.0–40.0)

## 2024-04-01 LAB — TSH: TSH: 0.73 u[IU]/mL (ref 0.35–5.50)

## 2024-04-01 LAB — HEMOGLOBIN A1C: Hgb A1c MFr Bld: 5.4 % (ref 4.6–6.5)

## 2024-04-01 MED ORDER — REPATHA SURECLICK 140 MG/ML ~~LOC~~ SOAJ: 140.0000 mg | SUBCUTANEOUS | 2 refills | Status: AC

## 2024-04-01 MED ORDER — FLUTICASONE PROPIONATE 50 MCG/ACT NA SUSP: 2.0000 | Freq: Every day | NASAL | 6 refills | Status: AC

## 2024-04-01 NOTE — Assessment & Plan Note (Signed)
 Chronic Situational, Controlled Continue alprazolam  0.5 mg twice daily as needed-rarely takes

## 2024-04-01 NOTE — Assessment & Plan Note (Signed)
 Chronic DEXA up-to-date, but due later this year Continue vitamin D , multivitamin Deferred medication last year-Will reevaluate after next DEXA Check vitamin D  level

## 2024-04-01 NOTE — Assessment & Plan Note (Addendum)
 Chronic BMI 37.16 with comorbidities of CKD normal 3A, history of CVA, hypertension, hyperlipidemia, prediabetes Stressed the importance of weight loss for multiple reasons Stressed that she needs to be exercising regularly Stressed to decrease calorie intake, healthy eating-increase protein, fiber and vegetables

## 2024-04-01 NOTE — Progress Notes (Signed)
 "   Subjective:    Patient ID: Shelley Hayes, female    DOB: June 23, 1945, 79 y.o.   MRN: [id number]      HPI Shelley Hayes is here for a Physical exam and her chronic medical problems.   Did not tolerate Zetia .  She has not tolerated more than 1 statin in the past.  She has intermittent pain in the left upper medial chest - it is intermittent x 3.5 months.  Occurs when she eating or finish eating.  She usually feels it at rest. Gas pills help.      Medications and allergies reviewed with patient and updated if appropriate.  Medications Ordered Prior to Encounter[1]  Review of Systems  Constitutional:  Negative for fever.  Eyes:  Negative for visual disturbance.  Respiratory:  Negative for cough, shortness of breath and wheezing.   Cardiovascular:  Positive for chest pain. Negative for palpitations and leg swelling.  Gastrointestinal:  Negative for abdominal pain, blood in stool, constipation, diarrhea and nausea.       No gerd  Genitourinary:  Negative for dysuria.  Musculoskeletal:  Negative for arthralgias and back pain.  Skin:  Negative for rash.  Neurological:  Negative for light-headedness and headaches.  Psychiatric/Behavioral:  Negative for dysphoric mood. The patient is not nervous/anxious.        Objective:   Vitals:   04/01/24 1303  BP: 130/70  Pulse: 68  Temp: 98.3 F (36.8 C)  SpO2: 97%   Filed Weights   04/01/24 1303  Weight: 184 lb (83.5 kg)   Body mass index is 37.16 kg/m.  BP Readings from Last 3 Encounters:  04/01/24 130/70  07/04/23 118/80  01/17/23 (!) 140/80    Wt Readings from Last 3 Encounters:  04/01/24 184 lb (83.5 kg)  03/25/24 184 lb (83.5 kg)  07/04/23 184 lb (83.5 kg)       Physical Exam Constitutional: She appears well-developed and well-nourished. No distress.  HENT:  Head: Normocephalic and atraumatic.  Right Ear: External ear normal. Normal ear canal and TM Left Ear: External ear normal.  Normal ear canal and  TM Mouth/Throat: Oropharynx is clear and moist.  Eyes: Conjunctivae normal.  Neck: Neck supple. No tracheal deviation present. No thyromegaly present.  No carotid bruit  Cardiovascular: Normal rate, regular rhythm and normal heart sounds.   No murmur heard.  No edema. Pulmonary/Chest: Effort normal and breath sounds normal. No respiratory distress. She has no wheezes. She has no rales.  Breast: deferred   Abdominal: Soft. She exhibits no distension. There is no tenderness.  Lymphadenopathy: She has no cervical adenopathy.  Skin: Skin is warm and dry. She is not diaphoretic.  Psychiatric: She has a normal mood and affect. Her behavior is normal.     Lab Results  Component Value Date   WBC 7.8 07/04/2023   HGB 13.5 07/04/2023   HCT 41.2 07/04/2023   PLT 325.0 07/04/2023   GLUCOSE 95 07/04/2023   CHOL 221 (H) 07/04/2023   TRIG 171.0 (H) 07/04/2023   HDL 45.60 07/04/2023   LDLDIRECT 142.0 06/22/2021   LDLCALC 141 (H) 07/04/2023   ALT 15 07/04/2023   AST 24 07/04/2023   NA 142 07/04/2023   K 4.2 07/04/2023   CL 104 07/04/2023   CREATININE 0.98 07/04/2023   BUN 16 07/04/2023   CO2 31 07/04/2023   TSH 1.05 06/30/2022   HGBA1C 5.5 07/04/2023         Assessment & Plan:   Physical exam: Screening  blood work  ordered Exercise  minimal - walking inside Weight  obese Substance abuse  none   Reviewed recommended immunizations.   Health Maintenance  Topic Date Due   Colonoscopy  09/27/2023   Zoster Vaccines- Shingrix (1 of 2) 06/30/2024 (Originally 09/12/1964)   DTaP/Tdap/Td (1 - Tdap) 04/01/2025 (Originally 10/26/2011)   Bone Density Scan  02/01/2025   Medicare Annual Wellness (AWV)  03/25/2025   Pneumococcal Vaccine: 50+ Years  Completed   Influenza Vaccine  Completed   Hepatitis C Screening  Completed   Meningococcal B Vaccine  Aged Out   Mammogram  Discontinued   COVID-19 Vaccine  Discontinued          See Problem List for Assessment and Plan of chronic  medical problems.        [1]  Current Outpatient Medications on File Prior to Visit  Medication Sig Dispense Refill   albuterol  (VENTOLIN  HFA) 108 (90 Base) MCG/ACT inhaler Inhale 2 puffs into the lungs every 6 (six) hours as needed for wheezing or shortness of breath. 18 g 5   ALPRAZolam  (XANAX ) 0.5 MG tablet Take 1 tablet (0.5 mg total) by mouth 2 (two) times daily as needed for anxiety or sleep. 30 tablet 0   amLODipine  (NORVASC ) 10 MG tablet TAKE 1 TABLET EVERY DAY 90 tablet 3   aspirin 81 MG tablet Take 81 mg by mouth daily.     Cholecalciferol (VITAMIN D3) 1000 UNITS CAPS Take 1 capsule (1,000 Units total) by mouth daily. 30 capsule    cyanocobalamin  (VITAMIN B12) 100 MCG tablet      irbesartan -hydrochlorothiazide  (AVALIDE) 300-12.5 MG tablet TAKE 1 TABLET EVERY DAY (NEW DOSE) 90 tablet 3   latanoprost  (XALATAN ) 0.005 % ophthalmic solution PLACE 1 DROP INTO BOTH EYES AT BEDTIME. 3 mL 0   Multiple Vitamin (MULTIVITAMIN) tablet Take 1 tablet by mouth daily.     No current facility-administered medications on file prior to visit.   "

## 2024-04-01 NOTE — Assessment & Plan Note (Signed)
 Chronic Blood pressure controlled Cmp, cbc She is experiencing muscle cramping which could potentially be related to hydrochlorothiazide  Continue amlodipine  10 mg daily, Avalide 300-12.5 mg daily

## 2024-04-01 NOTE — Assessment & Plan Note (Addendum)
 Chronic History of CVA Continue aspirin 81 mg daily Has tried to statins and had muscle aches, cramping Stopped Zetia  Check lipid panel Start Repatha  140 mg q. 14 days Blood pressure well controlled Encouraged healthy diet, regular exercise

## 2024-04-01 NOTE — Assessment & Plan Note (Signed)
 Chronic Lab Results  Component Value Date   HGBA1C 5.5 07/04/2023    Sugars well-controlled Will check A1c today Regular exercise, low sugar/carbohydrate diet

## 2024-04-01 NOTE — Patient Instructions (Addendum)
 Have blood work today   Medication - repatha  injection for your cholesterol  A referral was ordered for GI.     A Ct scan of your heart was ordered.     Follow up  - 6 months for follow up    Health Maintenance, Female Adopting a healthy lifestyle and getting preventive care are important in promoting health and wellness. Ask your health care provider about: The right schedule for you to have regular tests and exams. Things you can do on your own to prevent diseases and keep yourself healthy. What should I know about diet, weight, and exercise? Eat a healthy diet  Eat a diet that includes plenty of vegetables, fruits, low-fat dairy products, and lean protein. Do not eat a lot of foods that are high in solid fats, added sugars, or sodium. Maintain a healthy weight Body mass index (BMI) is used to identify weight problems. It estimates body fat based on height and weight. Your health care provider can help determine your BMI and help you achieve or maintain a healthy weight. Get regular exercise Get regular exercise. This is one of the most important things you can do for your health. Most adults should: Exercise for at least 150 minutes each week. The exercise should increase your heart rate and make you sweat (moderate-intensity exercise). Do strengthening exercises at least twice a week. This is in addition to the moderate-intensity exercise. Spend less time sitting. Even light physical activity can be beneficial. Watch cholesterol and blood lipids Have your blood tested for lipids and cholesterol at 79 years of age, then have this test every 5 years. Have your cholesterol levels checked more often if: Your lipid or cholesterol levels are high. You are older than 79 years of age. You are at high risk for heart disease. What should I know about cancer screening? Depending on your health history and family history, you may need to have cancer screening at various ages. This may  include screening for: Breast cancer. Cervical cancer. Colorectal cancer. Skin cancer. Lung cancer. What should I know about heart disease, diabetes, and high blood pressure? Blood pressure and heart disease High blood pressure causes heart disease and increases the risk of stroke. This is more likely to develop in people who have high blood pressure readings or are overweight. Have your blood pressure checked: Every 3-5 years if you are 79-74 years of age. Every year if you are 79 years old or older. Diabetes Have regular diabetes screenings. This checks your fasting blood sugar level. Have the screening done: Once every three years after age 24 if you are at a normal weight and have a low risk for diabetes. More often and at a younger age if you are overweight or have a high risk for diabetes. What should I know about preventing infection? Hepatitis B If you have a higher risk for hepatitis B, you should be screened for this virus. Talk with your health care provider to find out if you are at risk for hepatitis B infection. Hepatitis C Testing is recommended for: Everyone born from [birth year] through 1965. Anyone with known risk factors for hepatitis C. Sexually transmitted infections (STIs) Get screened for STIs, including gonorrhea and chlamydia, if: You are sexually active and are younger than 79 years of age. You are older than 79 years of age and your health care provider tells you that you are at risk for this type of infection. Your sexual activity has changed since you were last  screened, and you are at increased risk for chlamydia or gonorrhea. Ask your health care provider if you are at risk. Ask your health care provider about whether you are at high risk for HIV. Your health care provider may recommend a prescription medicine to help prevent HIV infection. If you choose to take medicine to prevent HIV, you should first get tested for HIV. You should then be tested every 3 months  for as long as you are taking the medicine. Pregnancy If you are about to stop having your period (premenopausal) and you may become pregnant, seek counseling before you get pregnant. Take 400 to 800 micrograms (mcg) of folic acid every day if you become pregnant. Ask for birth control (contraception) if you want to prevent pregnancy. Osteoporosis and menopause Osteoporosis is a disease in which the bones lose minerals and strength with aging. This can result in bone fractures. If you are 57 years old or older, or if you are at risk for osteoporosis and fractures, ask your health care provider if you should: Be screened for bone loss. Take a calcium  or vitamin D  supplement to lower your risk of fractures. Be given hormone replacement therapy (HRT) to treat symptoms of menopause. Follow these instructions at home: Alcohol use Do not drink alcohol if: Your health care provider tells you not to drink. You are pregnant, may be pregnant, or are planning to become pregnant. If you drink alcohol: Limit how much you have to: 0-1 drink a day. Know how much alcohol is in your drink. In the [place] one drink equals one 12 oz bottle of beer (355 mL), one 5 oz glass of wine (148 mL), or one 1 oz glass of hard liquor (44 mL). Lifestyle Do not use any products that contain nicotine or tobacco. These products include cigarettes, chewing tobacco, and vaping devices, such as e-cigarettes. If you need help quitting, ask your health care provider. Do not use street drugs. Do not share needles. Ask your health care provider for help if you need support or information about quitting drugs. General instructions Schedule regular health, dental, and eye exams. Stay current with your vaccines. Tell your health care provider if: You often feel depressed. You have ever been abused or do not feel safe at home. Summary Adopting a healthy lifestyle and getting preventive care are important in promoting health and  wellness. Follow your health care provider's instructions about healthy diet, exercising, and getting tested or screened for diseases. Follow your health care provider's instructions on monitoring your cholesterol and blood pressure. This information is not intended to replace advice given to you by your health care provider. Make sure you discuss any questions you have with your health care provider. Document Revised: 07/19/2020 Document Reviewed: 07/19/2020 Elsevier Patient Education  2024 [employer].

## 2024-04-01 NOTE — Assessment & Plan Note (Signed)
 Acute For the past 3-1/2 months she has been experiencing intermittent left upper medial chest pain typically at rest or after eating or with eating Taking a gas pill has helped Has not had any pain with activity Atypical for cardiac pain-possibly gastrointestinal Given risk factors good candidate for screening for CAD with CT CAC which she agrees to-ordered She is eating a lot of fruits and vegetables which could be contributing to some reflux that she is not aware of She will try revising diet

## 2024-04-01 NOTE — Assessment & Plan Note (Addendum)
 Chronic LDL goal < 70 Lab Results  Component Value Date   LDLCALC 141 (H) 07/04/2023   Regular exercise and healthy diet encouraged Check lipid panel, CMP, TSH Currently not on a statin secondary to myalgias Zetia  discontinued due to side effects Start repatha  140 mg every 14 days

## 2024-04-01 NOTE — Assessment & Plan Note (Signed)
 Chronic Mild, stage 3a Likely related to NSAID use - She has stopped all NSAIDs  Blood pressure, sugar well-controlled She is compliant with good water intake CBC, CMP, vit d level

## 2024-04-02 ENCOUNTER — Other Ambulatory Visit (HOSPITAL_COMMUNITY): Payer: Self-pay

## 2024-04-02 ENCOUNTER — Telehealth: Payer: Self-pay | Admitting: Pharmacy Technician

## 2024-04-02 NOTE — Telephone Encounter (Signed)
 Pharmacy Patient Advocate Encounter  Received notification from [employer] that Prior Authorization for Repatha  has been APPROVED from 03/13/24 to 03/12/25. Ran test claim, Copay is $397.00- one month. This test claim was processed through [facility]- copay amounts may vary at other pharmacies due to pharmacy/plan contracts, or as the patient moves through the different stages of their insurance plan.   PA #/Case ID/Reference #: [id number]

## 2024-04-02 NOTE — Telephone Encounter (Signed)
 Pharmacy Patient Advocate Encounter   Received notification from CoverMyMeds that prior authorization for repatha  is required/requested.   Insurance verification completed.   The patient is insured through [employer].   Per test claim: PA required; PA submitted to above mentioned insurance via Latent Key/confirmation #/[id number] Status is pending

## 2024-04-03 ENCOUNTER — Ambulatory Visit: Payer: Self-pay | Admitting: Internal Medicine

## 2024-05-16 ENCOUNTER — Other Ambulatory Visit (HOSPITAL_COMMUNITY)

## 2024-09-29 ENCOUNTER — Ambulatory Visit: Admitting: Internal Medicine
